# Patient Record
Sex: Male | Born: 1935
Health system: Southern US, Community
[De-identification: ages and names within clinical notes are randomized; demographics above are authoritative.]

## PROBLEM LIST (undated history)

## (undated) DIAGNOSIS — Z5189 Encounter for other specified aftercare: Secondary | ICD-10-CM

## (undated) DIAGNOSIS — K648 Other hemorrhoids: Secondary | ICD-10-CM

## (undated) DIAGNOSIS — D126 Benign neoplasm of colon, unspecified: Secondary | ICD-10-CM

## (undated) DIAGNOSIS — C801 Malignant (primary) neoplasm, unspecified: Secondary | ICD-10-CM

## (undated) DIAGNOSIS — E785 Hyperlipidemia, unspecified: Secondary | ICD-10-CM

## (undated) DIAGNOSIS — K219 Gastro-esophageal reflux disease without esophagitis: Secondary | ICD-10-CM

## (undated) DIAGNOSIS — H269 Unspecified cataract: Secondary | ICD-10-CM

## (undated) DIAGNOSIS — T7840XA Allergy, unspecified, initial encounter: Secondary | ICD-10-CM

## (undated) DIAGNOSIS — I10 Essential (primary) hypertension: Secondary | ICD-10-CM

## (undated) DIAGNOSIS — I6529 Occlusion and stenosis of unspecified carotid artery: Secondary | ICD-10-CM

## (undated) DIAGNOSIS — I493 Ventricular premature depolarization: Secondary | ICD-10-CM

## (undated) DIAGNOSIS — J449 Chronic obstructive pulmonary disease, unspecified: Secondary | ICD-10-CM

## (undated) HISTORY — DX: Unspecified cataract: H26.9

## (undated) HISTORY — PX: APPENDECTOMY: SHX54

## (undated) HISTORY — DX: Gastro-esophageal reflux disease without esophagitis: K21.9

## (undated) HISTORY — DX: Essential (primary) hypertension: I10

## (undated) HISTORY — DX: Other hemorrhoids: K64.8

## (undated) HISTORY — PX: OTHER SURGICAL HISTORY: SHX169

## (undated) HISTORY — PX: POLYPECTOMY: SHX149

## (undated) HISTORY — PX: UPPER GASTROINTESTINAL ENDOSCOPY: SHX188

## (undated) HISTORY — PX: TRANSURETHRAL RESECTION OF PROSTATE: SHX73

## (undated) HISTORY — DX: Occlusion and stenosis of unspecified carotid artery: I65.29

## (undated) HISTORY — PX: COLONOSCOPY: SHX174

## (undated) HISTORY — DX: Chronic obstructive pulmonary disease, unspecified: J44.9

## (undated) HISTORY — DX: Allergy, unspecified, initial encounter: T78.40XA

## (undated) HISTORY — DX: Malignant (primary) neoplasm, unspecified: C80.1

## (undated) HISTORY — DX: Benign neoplasm of colon, unspecified: D12.6

## (undated) HISTORY — PX: CATARACT EXTRACTION: SUR2

## (undated) HISTORY — DX: Ventricular premature depolarization: I49.3

## (undated) HISTORY — DX: Encounter for other specified aftercare: Z51.89

## (undated) HISTORY — DX: Hyperlipidemia, unspecified: E78.5

---

## 1999-11-20 DIAGNOSIS — D126 Benign neoplasm of colon, unspecified: Secondary | ICD-10-CM

## 1999-11-20 HISTORY — DX: Benign neoplasm of colon, unspecified: D12.6

## 1999-12-16 ENCOUNTER — Encounter (INDEPENDENT_AMBULATORY_CARE_PROVIDER_SITE_OTHER): Payer: Self-pay | Admitting: Specialist

## 1999-12-16 ENCOUNTER — Ambulatory Visit (HOSPITAL_COMMUNITY): Admission: RE | Admit: 1999-12-16 | Discharge: 1999-12-16 | Payer: Self-pay | Admitting: Gastroenterology

## 2000-06-22 ENCOUNTER — Ambulatory Visit (HOSPITAL_COMMUNITY): Admission: RE | Admit: 2000-06-22 | Discharge: 2000-06-22 | Payer: Self-pay | Admitting: *Deleted

## 2004-03-29 ENCOUNTER — Ambulatory Visit: Payer: Self-pay | Admitting: Gastroenterology

## 2004-04-12 ENCOUNTER — Ambulatory Visit: Payer: Self-pay | Admitting: Gastroenterology

## 2005-10-05 ENCOUNTER — Ambulatory Visit: Payer: Self-pay | Admitting: Cardiology

## 2005-10-20 ENCOUNTER — Ambulatory Visit: Payer: Self-pay

## 2005-11-04 ENCOUNTER — Encounter (INDEPENDENT_AMBULATORY_CARE_PROVIDER_SITE_OTHER): Payer: Self-pay | Admitting: *Deleted

## 2005-11-04 ENCOUNTER — Inpatient Hospital Stay (HOSPITAL_COMMUNITY): Admission: RE | Admit: 2005-11-04 | Discharge: 2005-11-05 | Payer: Self-pay | Admitting: Vascular Surgery

## 2006-02-01 ENCOUNTER — Ambulatory Visit: Payer: Self-pay | Admitting: Orthopedic Surgery

## 2006-03-20 ENCOUNTER — Ambulatory Visit: Payer: Self-pay | Admitting: Orthopedic Surgery

## 2006-04-24 ENCOUNTER — Ambulatory Visit: Payer: Self-pay | Admitting: Orthopedic Surgery

## 2006-06-20 ENCOUNTER — Ambulatory Visit: Payer: Self-pay | Admitting: Vascular Surgery

## 2006-07-05 ENCOUNTER — Ambulatory Visit: Payer: Self-pay | Admitting: Emergency Medicine

## 2006-08-15 ENCOUNTER — Ambulatory Visit: Payer: Self-pay | Admitting: Emergency Medicine

## 2006-09-05 ENCOUNTER — Ambulatory Visit: Payer: Self-pay | Admitting: Cardiology

## 2006-10-06 ENCOUNTER — Encounter: Payer: Self-pay | Admitting: Cardiology

## 2006-10-06 ENCOUNTER — Ambulatory Visit: Payer: Self-pay

## 2006-10-06 LAB — CONVERTED CEMR LAB
Calcium: 9.7 mg/dL (ref 8.4–10.5)
Chloride: 104 meq/L (ref 96–112)
Creatinine, Ser: 1.1 mg/dL (ref 0.4–1.5)
GFR calc Af Amer: 86 mL/min
Glucose, Bld: 117 mg/dL — ABNORMAL HIGH (ref 70–99)
Potassium: 4.4 meq/L (ref 3.5–5.1)

## 2006-12-26 ENCOUNTER — Ambulatory Visit: Payer: Self-pay | Admitting: Vascular Surgery

## 2007-03-12 ENCOUNTER — Ambulatory Visit: Payer: Self-pay | Admitting: Gastroenterology

## 2007-03-30 ENCOUNTER — Ambulatory Visit: Payer: Self-pay | Admitting: Gastroenterology

## 2007-03-30 ENCOUNTER — Encounter: Payer: Self-pay | Admitting: Gastroenterology

## 2007-08-30 ENCOUNTER — Ambulatory Visit: Payer: Self-pay | Admitting: Cardiology

## 2007-09-24 ENCOUNTER — Ambulatory Visit: Payer: Self-pay | Admitting: Emergency Medicine

## 2007-09-24 DIAGNOSIS — J449 Chronic obstructive pulmonary disease, unspecified: Secondary | ICD-10-CM | POA: Insufficient documentation

## 2007-12-31 ENCOUNTER — Ambulatory Visit: Payer: Self-pay | Admitting: Vascular Surgery

## 2008-04-02 ENCOUNTER — Encounter: Admission: RE | Admit: 2008-04-02 | Discharge: 2008-04-02 | Payer: Self-pay | Admitting: Orthopedic Surgery

## 2008-04-03 ENCOUNTER — Ambulatory Visit (HOSPITAL_BASED_OUTPATIENT_CLINIC_OR_DEPARTMENT_OTHER): Admission: RE | Admit: 2008-04-03 | Discharge: 2008-04-03 | Payer: Self-pay | Admitting: Orthopedic Surgery

## 2008-08-20 DIAGNOSIS — E785 Hyperlipidemia, unspecified: Secondary | ICD-10-CM | POA: Insufficient documentation

## 2008-08-20 DIAGNOSIS — I10 Essential (primary) hypertension: Secondary | ICD-10-CM

## 2008-08-20 DIAGNOSIS — R609 Edema, unspecified: Secondary | ICD-10-CM

## 2008-08-20 DIAGNOSIS — R0602 Shortness of breath: Secondary | ICD-10-CM

## 2008-08-20 DIAGNOSIS — I2510 Atherosclerotic heart disease of native coronary artery without angina pectoris: Secondary | ICD-10-CM | POA: Insufficient documentation

## 2008-08-20 DIAGNOSIS — G5620 Lesion of ulnar nerve, unspecified upper limb: Secondary | ICD-10-CM

## 2008-08-20 DIAGNOSIS — I679 Cerebrovascular disease, unspecified: Secondary | ICD-10-CM

## 2008-08-20 DIAGNOSIS — Z8669 Personal history of other diseases of the nervous system and sense organs: Secondary | ICD-10-CM | POA: Insufficient documentation

## 2008-08-20 DIAGNOSIS — Z8546 Personal history of malignant neoplasm of prostate: Secondary | ICD-10-CM

## 2008-08-21 ENCOUNTER — Ambulatory Visit: Payer: Self-pay | Admitting: Cardiology

## 2008-12-23 ENCOUNTER — Ambulatory Visit: Payer: Self-pay | Admitting: Vascular Surgery

## 2009-09-17 ENCOUNTER — Ambulatory Visit: Payer: Self-pay | Admitting: Cardiology

## 2009-09-30 ENCOUNTER — Telehealth (INDEPENDENT_AMBULATORY_CARE_PROVIDER_SITE_OTHER): Payer: Self-pay | Admitting: *Deleted

## 2009-10-01 ENCOUNTER — Ambulatory Visit: Payer: Self-pay | Admitting: Cardiology

## 2009-10-01 ENCOUNTER — Encounter: Payer: Self-pay | Admitting: Cardiology

## 2009-10-01 ENCOUNTER — Encounter (HOSPITAL_COMMUNITY): Admission: RE | Admit: 2009-10-01 | Discharge: 2009-11-25 | Payer: Self-pay | Admitting: Cardiology

## 2009-10-01 ENCOUNTER — Ambulatory Visit: Payer: Self-pay

## 2010-03-11 ENCOUNTER — Ambulatory Visit: Payer: Self-pay | Admitting: Vascular Surgery

## 2010-04-20 NOTE — Progress Notes (Signed)
Summary: Nuclear Pre-Procedure   Nuclear Med Background Indications for Stress Test: Evaluation for Ischemia   History: COPD, Echo, Heart Catheterization, Myocardial Perfusion Study  History Comments: 04/02 Heart Cath N/O CAD 07/08 MPS (-) ischemis EF 63% '08 ECHO EF 60% mild MR '07 S/P EF (R) CEA COPD  Symptoms: DOE    Nuclear Pre-Procedure Cardiac Risk Factors: Carotid Disease, History of Smoking, Lipids Height (in): 70  Nuclear Med Study Referring MD:  B.Crenshaw

## 2010-04-20 NOTE — Progress Notes (Signed)
Summary: Nuclear Pre-Procedure  Phone Note Outgoing Call   Call placed by: Milana Na, EMT-P,  September 30, 2009 2:59 PM Summary of Call: Left message with information on Myoview Information Sheet (see scanned document for details).      Nuclear Med Background Indications for Stress Test: Evaluation for Ischemia   History: COPD      Nuclear Pre-Procedure Height (in): 70

## 2010-04-20 NOTE — Assessment & Plan Note (Signed)
Summary: f1y/dm    Primary Provider:  Dr. Samuel Jester  CC:  pt takes another med for triglycierdes but dosent no the name of it.  History of Present Illness:  [name] is a pleasant gentleman who has a history of coronary disease on previous catheterization in April 2002.  At that time, he had a 30% left main, 20-30% LAD, 30% circumflex and his LV function was normal.  He also has had a previous carotid endarterectomy in August 2007.  His most recent carotid Dopplers were performed on December 26, 2006, and there was a 20-39% left internal carotid artery stenosis and there was no stenosis in the right.  His most recent Myoview was performed on 10/06/2006.  At that time, he was found to have thinning of the inferior wall, but no ischemia or infarction.  Ejection fraction was 63%.  An echocardiogram in July 2008 showed normal LV function and mild mitral regurgitation as well as tricuspid regurgitation.  He has had a previous abdominal ultrasound showed no aneurysm in July 2008. I last saw him in June of 2010. Since then the patient has dyspnea with more extreme activities but not with routine activities. It is relieved with rest. It is not associated with chest pain. There is no orthopnea, PND or pedal edema. There is no syncope or palpitations. There is no exertional chest pain.   Current Medications (verified): 1)  Spiriva Handihaler 18 Mcg  Caps (Tiotropium Bromide Monohydrate) .... Inhale Contents of 1 Capsule Once A Day 2)  Xyzal 5 Mg  Tabs (Levocetirizine Dihydrochloride) .... Take One Tablet Daily 3)  Ramipril 10 Mg  Caps (Ramipril) .... One Capsule Daliy. 4)  Adult Aspirin Ec Low Strength 81 Mg  Tbec (Aspirin) .... Tale Two Tablets Daily. 5)  Crestor 40 Mg  Tabs (Rosuvastatin Calcium) .... Take One Tablet At Bedtime 6)  Vesicare 10 Mg  Tabs (Solifenacin Succinate) .... Take One Tablet Daily.  Past History:  Past Medical History: CAD (ICD-414.00) CEREBROVASCULAR DISEASE  (ICD-437.9) HYPERLIPIDEMIA (ICD-272.4) HYPERTENSION (ICD-401.9) PROSTATE CANCER (ICD-185) ULNAR NEUROPATHY (ICD-354.2) CARPAL TUNNEL SYNDROME, HX OF (ICD-V12.49) COPD (ICD-496)  Past Surgical History: carotid endarterectomy in August 2007 status post prostatectomy.  Social History: Reviewed history from 08/21/2008 and no changes required.  He is married, has two children and is retired.  He has not  smoked for the past 8 years but did smoke 1-1/2 pack of cigarettes per day for 40 years.  Does not use alcohol.  Review of Systems       no fevers or chills, productive cough, hemoptysis, dysphasia, odynophagia, melena, hematochezia, dysuria, hematuria, rash, seizure activity, orthopnea, PND, pedal edema, claudication. Remaining systems are negative.   Vital Signs:  Patient profile:   75 year old male Height:      70 inches Weight:      177 pounds BMI:     25.49 Pulse rate:   58 / minute Resp:     14 per minute BP sitting:   111 / 67  (left arm)  Vitals Entered By: Kem Parkinson (September 17, 2009 8:31 AM)  Physical Exam  General:  Well-developed well-nourished in no acute distress.  Skin is warm and dry.  HEENT is normal.  Neck is supple. No thyromegaly.  Chest is clear to auscultation with normal expansion.  Cardiovascular exam is regular rate and rhythm.  Abdominal exam nontender or distended. No masses palpated. Extremities show no edema. neuro grossly intact    EKG  Procedure date:  09/17/2009  Findings:      Sinus bradycardia at a rate of 54. No ST changes.  Impression & Recommendations:  Problem # 1:  CAD (ICD-414.00) Continue aspirin, statin and ACE inhibitor. Schedule Myoview for risk stratification. His updated medication list for this problem includes:    Ramipril 10 Mg Caps (Ramipril) ..... One capsule daliy.    Adult Aspirin Ec Low Strength 81 Mg Tbec (Aspirin) .Marland Kitchen... Tale two tablets daily.  His updated medication list for this problem includes:     Ramipril 10 Mg Caps (Ramipril) ..... One capsule daliy.    Adult Aspirin Ec Low Strength 81 Mg Tbec (Aspirin) .Marland Kitchen... Tale two tablets daily.  Problem # 2:  CEREBROVASCULAR DISEASE (ICD-437.9) Continue aspirin and statin. Carotid Dopplers followed by vascular surgery.  Problem # 3:  HYPERLIPIDEMIA (ICD-272.4) Continue present medications. Lipids and liver monitored by primary care. The following medications were removed from the medication list:    Trilipix 135 Mg Cpdr (Choline fenofibrate) .Marland Kitchen... Take one capsule daily. His updated medication list for this problem includes:    Crestor 40 Mg Tabs (Rosuvastatin calcium) .Marland Kitchen... Take one tablet at bedtime  Problem # 4:  HYPERTENSION (ICD-401.9) Blood pressure controlled on present medications. Will continue. Renal function and potassium monitored by primary care. His updated medication list for this problem includes:    Ramipril 10 Mg Caps (Ramipril) ..... One capsule daliy.    Adult Aspirin Ec Low Strength 81 Mg Tbec (Aspirin) .Marland Kitchen... Tale two tablets daily.  Problem # 5:  COPD (ICD-496)  His updated medication list for this problem includes:    Spiriva Handihaler 18 Mcg Caps (Tiotropium bromide monohydrate) ..... Inhale contents of 1 capsule once a day  Other Orders: Nuclear Stress Test (Nuc Stress Test)  Patient Instructions: 1)  Your physician recommends that you schedule a follow-up appointment in: ONE YEAR 2)  Your physician has requested that you have an exercise stress myoview.  For further information please visit [email or web address]  Please follow instruction sheet, as given.

## 2010-04-20 NOTE — Assessment & Plan Note (Signed)
Summary: Cardiology Nuclear Study  Nuclear Med Background Indications for Stress Test: Evaluation for Ischemia   History: COPD, Echo, Heart Catheterization, Myocardial Perfusion Study  History Comments: '02 Cath:n/o CAD; '08 MPS:no ischemia, EF= 63%; '08 Echo:EF=60%, mild MR; '07 (R) CEA  Symptoms: DOE    Nuclear Pre-Procedure Cardiac Risk Factors: Carotid Disease, Family History - CAD, History of Smoking, Hypertension, Lipids Caffeine/Decaff Intake: None NPO After: 9:30 PM Lungs: Clear IV 0.9% NS with Angio Cath: 18g     IV Site: (R) AC IV Started by: Stanton Kidney EMT-P Chest Size (in) 42     Height (in): 70 Weight (lb): 177 BMI: 25.49  Nuclear Med Study 1 or 2 day study:  1 day     Stress Test Type:  Stress Reading MD:  Olga Millers, MD     Referring MD:  Olga Millers, MD Resting Radionuclide:  Technetium 43m Tetrofosmin     Resting Radionuclide Dose:  10.0 mCi  Stress Radionuclide:  Technetium 97m Tetrofosmin     Stress Radionuclide Dose:  33.0 mCi   Stress Protocol Exercise Time (min):  9:45 min     Max HR:  126 bpm     Predicted Max HR:  149 bpm  Max Systolic BP: 181 mm Hg     Percent Max HR:  84.56 %     METS: 10.1 Rate Pressure Product:  16109    Stress Test Technologist:  [employer]a [employer] CMA-N     Nuclear Technologist:  Domenic Polite CNMT  Rest Procedure  Myocardial perfusion imaging was performed at rest 45 minutes following the intravenous administration of Myoview Technetium 75m Tetrofosmin.  Stress Procedure  The patient exercised for 9:45.  The patient stopped due to fatigue and bilateral thigh pain, R=L.  He denied any chest pain.  There were nonspecific ST-T wave changes with frequent PAC's and occasional PVC's.  He had a mild hypertensive response to exercise, 166/100.  Myoview was injected at peak exercise and myocardial perfusion imaging was performed after a brief delay.  QPS Raw Data Images:  Acuisition technically good; normal left ventricular  size. Stress Images:  There is decreased uptake in the inferior wall and apex. Rest Images:  There is decreased uptake in the inferior wall and apex. Subtraction (SDS):  No evidence of ischemia. Transient Ischemic Dilatation:  .95  (Normal <1.22)  Lung/Heart Ratio:  .29  (Normal <0.45)  Quantitative Gated Spect Images QGS EDV:  102 ml QGS ESV:  41 ml QGS EF:  59 % QGS cine images:  Normal wall motion.   Overall Impression  Exercise Capacity: Good exercise capacity. BP Response: Normal blood pressure response. Clinical Symptoms: No chest pain ECG Impression: Insignificant upsloping ST segment depression. Overall Impression: Low risk stress nuclear study with inferior and apical thinning but no ischemia.  Appended Document: Cardiology Nuclear Study pt aware of results

## 2010-07-05 LAB — BASIC METABOLIC PANEL
BUN: 10 mg/dL (ref 6–23)
CO2: 27 mEq/L (ref 19–32)
Chloride: 105 mEq/L (ref 96–112)
Creatinine, Ser: 1.02 mg/dL (ref 0.4–1.5)
GFR calc Af Amer: >60 mL/min (ref >60)
Potassium: 4.8 mEq/L (ref 3.5–5.1)

## 2010-07-05 LAB — POCT HEMOGLOBIN-HEMACUE: Hemoglobin: 14.8 g/dL (ref 13.0–17.0)

## 2010-08-03 NOTE — Procedures (Signed)
CAROTID DUPLEX EXAM   INDICATION:  Follow up right carotid endarterectomy.   HISTORY:  Diabetes:  No.  Cardiac:  No.  Hypertension:  Yes.  Smoking:  Previous.  Previous Surgery:  Right carotid endarterectomy site on 11/04/2005.  CV History:  Currently asymptomatic.  Amaurosis Fugax No, Paresthesias No, Hemiparesis No.                                       RIGHT             LEFT  Brachial systolic pressure:         122               120  Brachial Doppler waveforms:         Normal            Normal  Vertebral direction of flow:        Antegrade         Antegrade  DUPLEX VELOCITIES (cm/sec)  CCA peak systolic                   97                119  ECA peak systolic                   145               92  ICA peak systolic                   50                73  ICA end diastolic                   19                22  PLAQUE MORPHOLOGY:                                    Heterogenous  PLAQUE AMOUNT:                      None              Minimal  PLAQUE LOCATION:                                      ICA/bifurcation   IMPRESSION:  1. Patent right carotid endarterectomy site with no right internal      carotid artery stenosis.  2. No hemodynamically significant stenosis of the left internal      carotid artery with minimal plaque formation, as described above.  3. No significant change noted when compared to the previous      examination on 12/23/2008.   ___________________________________________  Quita Skye. Hart Rochester, M.D.   CH/MEDQ  D:  03/12/2010  T:  03/12/2010  Job:  [id number]

## 2010-08-03 NOTE — Assessment & Plan Note (Signed)
[facility]                             PULMONARY OFFICE NOTE   Anthony, Horn                      MRN:          [id number]  DATE:08/15/2006                            DOB:          [birth year]    SUBJECTIVE:  [name] is a pleasant 75 year old gentleman who  follows up today for his dyspnea.  He has a history of tobacco abuse and  also asbestos exposure.  He has completed pulmonary function testing and  a chest x-ray and is here to review the results of these.  He tells me  that he has not had any significant change in his breathing since our  last visit.  He continues to have wheezing usually with exertion, but  sometimes also at rest.  He is not producing any phlegm and he does not  have any significant cough.  His exertional tolerance is unchanged.   CURRENT MEDICATIONS:  1. Altace 10 mg daily.  2. Ditropan 10 mg daily.  3. Crestor 10 mg daily.  4. TriCor 145 mg daily.  5. Aspirin 81 mg daily.   PHYSICAL EXAMINATION:  GENERAL:  This is a pleasant, elderly gentleman  who is in no acute distress on room air.  HEENT:  He has no stridor.  LUNGS:  Clear to auscultation bilaterally, somewhat distant but without  wheezing or crackles.  HEART:  Regular rate and rhythm without murmur.  He is borderline  bradycardic.  ABDOMEN:  Benign.  EXTREMITIES:  No cyanosis, clubbing, or edema.  VITAL SIGNS:  Weight 185 pounds, temperature 97.7, blood pressure  128/72, heart rate 57, SPO2 97% on room air.   Pulmonary function testing was performed today.  This showed some  evidence of mild airflow limitation without any bronchodilator  responsiveness.  His FEV1 is 2.38 liters or 77% of predicted.  His lung  volumes are normal.  His diffusion capacity is slightly decreased but  corrected to the normal range when adjusted for alveolar volume.  His  chest x-ray from today shows no obvious infiltrates.  He does have some  slight enlargement of his  pulmonary arteries especially on the left.  I  do not see any evidence of pleural plaquing.   IMPRESSION:  1. Dyspnea.  2. Mild chronic obstructive pulmonary disease, no longer smoking.  3. History of asbestos exposure without any current evidence for      restrictive lung disease or asbestosis on chest x-ray.   PLAN:  1. I would like to initiate a trial of Spiriva one inhalation daily to      see if this helps with his exertional dyspnea given his newly      diagnosed COPD.  2. Proair two puffs q.4 hours p.r.n. for shortness of breath.  3. I will defer any further evaluation for asbestos-related lung      disease including high resolution CT scan at this time given his      reassuring PFT's and chest x-ray.  We may need to revisit this at      some point in the future if further evidence evolves that would  be      consistent with asbestosis.  4. I will follow with the patient in 1 month to assess him for      improvement on his bronchodilators.     Leslye Peer, MD  Electronically Signed    RSB/MedQ  DD: 08/15/2006  DT: 08/15/2006  Job #: [id number]   cc:   [facility]

## 2010-08-03 NOTE — Op Note (Signed)
[name], [name]             ACCOUNT NO.:  [id number]   MEDICAL RECORD NO.:  [id number]          PATIENT TYPE:  AMB   LOCATION:  DSC                          FACILITY:  [facility]   PHYSICIAN:  Cindee Salt, M.D.       DATE OF BIRTH:  Apr 17, 1935   DATE OF PROCEDURE:  04/03/2008  DATE OF DISCHARGE:                               OPERATIVE REPORT   PREOPERATIVE DIAGNOSES:  1. Carpal tunnel syndrome, left hand.  2. Ulnar neuropathy, left elbow.   POSTOPERATIVE DIAGNOSES:  1. Carpal tunnel syndrome, left hand.  2. Ulnar neuropathy, left elbow.   OPERATION:  Decompression, left median nerve; decompression, ulnar nerve  left elbow.   SURGEON:  Cindee Salt, MD   ASSISTANT:  Carolyne Fiscal, RN   ANESTHESIA:  Axillary general.   ANESTHESIOLOGIST:  Zenon Mayo, MD   HISTORY:  The patient is a 75 year old male with a history of carpal  tunnel syndrome.  EMG and nerve conductions positive.  Also has ulnar  neuropathy at his left elbow.  He has elected to proceed to have these  surgically released in that he has not responded to conservative  treatment.  In the preoperative area, the patient is seen.  The  extremity marked by both the patient and surgeon, antibiotic given.  He  is well aware of risks and complications including infection;  recurrence; injury to arteries, nerves, and tendons; incomplete relief  of symptoms; dystrophy; possibility of transposition to the ulnar nerve;  possibility of numbness and tingling in the posterior aspect of his  elbow.   PROCEDURE:  The patient was brought to the operating room.  A general  anesthetic was carried out along with an axillary block.  He was prepped  using DuraPrep in supine position with right arm free.  The anesthesia  was given under the direction of Dr. Sampson Goon.  A time-out was taken.  The limb was exsanguinated with an Esmarch bandage.  Tourniquet was  placed high on the arm, was inflated to 250 mmHg.  A longitudinal  incision  was made in the palm, carried down through the subcutaneous  tissue.  Bleeders were electrocauterized.  Palmar fascia was split.  Superficial palmar arch identified.  The flexor tendon to the ring  little finger identified to the ulnar side of the median nerve.  The  carpal retinaculum was incised with sharp dissection.  A right-angle and  Sewall retractors were placed between skin and forearm fascia.  The  fascia was released for approximately a 1.5 cm proximal to the wrist  crease under direct vision.  The canal was explored.  Area of  compression to the nerve was apparent.  No further lesions were  identified.  The wound was irrigated.  Skin was then closed with  interrupted 5-0 Vicryl Rapide sutures.  A separate longitudinal incision  was made on the medial aspect of the elbow, carried down through the  subcutaneous tissue.  The Osborne fascia was identified along the medial  epicondyle.  An incision made in this.  The ulnar nerve was identified.  With blunt and sharp dissection, the proximal  fascia was then released  to the level of the arcade of Struthers.  Retractors were then placed  distally including a Sewall retractor, right angle retractor.  The  superficial fascia of the flexor carpi ulnaris was then released.  With  dissecting scissors, the deep fascia was then released protecting the  ulnar nerve.  This was done for approximately 6 cm in each direction.  The arm placed through full flexion, full extension, no subluxation to  the nerve was apparent.  The wound was copiously irrigated with saline.  The skin closed with interrupted 5-0 Vicryl Rapide sutures.  A sterile  compressive dressing to the wrist was applied along with a splint to the  elbow.  The patient tolerated the procedure well.  On deflation of the  tourniquet, all fingers immediately pinked.  He was taken to the  recovery room for observation in satisfactory condition.  He will be  discharged home, to return to  the [facility] in 1 week,  on Vicodin.           ______________________________  Cindee Salt, M.D.     GK/MEDQ  D:  04/03/2008  T:  04/04/2008  Job:  [id number]   cc:   Dr. Charm Barges

## 2010-08-03 NOTE — Procedures (Signed)
CAROTID DUPLEX EXAM   INDICATION:  Follow up carotid artery disease.   HISTORY:  Diabetes:  No.  Cardiac:  No.  Hypertension:  Yes.  Smoking:  Quit nine years ago.  Previous Surgery:  Right carotid endarterectomy with DPA on 11/04/2005  by Dr. Hart Rochester.  CV History:  Amaurosis Fugax No, Paresthesias No, Hemiparesis No                                       RIGHT             LEFT  Brachial systolic pressure:         120               130  Brachial Doppler waveforms:         Biphasic          Biphasic  Vertebral direction of flow:        Antegrade         Antegrade  DUPLEX VELOCITIES (cm/sec)  CCA peak systolic                   97                128  ECA peak systolic                   129               110  ICA peak systolic                   39                57  ICA end diastolic                   16                25  PLAQUE MORPHOLOGY:                  None              Mixed  PLAQUE AMOUNT:                      None              Mild  PLAQUE LOCATION:                    None              Proximal ICA   IMPRESSION:  1. 20-39% left internal carotid artery stenosis.  2. No right internal carotid artery stenosis, status post      endarterectomy.  3. Study essentially unchanged from June 20, 2006.   ___________________________________________  Quita Skye. Hart Rochester, M.D.   DP/MEDQ  D:  12/26/2006  T:  12/26/2006  Job:  [id number]

## 2010-08-03 NOTE — Procedures (Signed)
CAROTID DUPLEX EXAM   INDICATION:  Follow up right carotid endarterectomy.   HISTORY:  Diabetes:  No.  Cardiac:  No.  Hypertension:  Yes.  Smoking:  Quit.  Previous Surgery:  Right carotid endarterectomy on 11/04/05.  CV History:  Amaurosis Fugax No, Paresthesias No, Hemiparesis No.                                       RIGHT             LEFT  Brachial systolic pressure:         122               118  Brachial Doppler waveforms:         Biphasic          Biphasic  Vertebral direction of flow:        Antegrade         Antegrade  DUPLEX VELOCITIES (cm/sec)  CCA peak systolic                   117               135  ECA peak systolic                   128               111  ICA peak systolic                   66                99  ICA end diastolic                   14                28  PLAQUE MORPHOLOGY:                  None              Heterogenous  PLAQUE AMOUNT:                      None              Mild  PLAQUE LOCATION:                    None              ICA, ECA   IMPRESSION:  1. 20-39% stenosis noted in the left internal carotid artery.  2. Normal carotid duplex noted in the right internal carotid artery,      status post right carotid endarterectomy.  3. Antegrade bilateral vertebral arteries.   ___________________________________________  [name], M.D.   MG/MEDQ  D:  12/23/2008  T:  12/24/2008  Job:  [id number]

## 2010-08-03 NOTE — Assessment & Plan Note (Signed)
[facility]                            CARDIOLOGY OFFICE NOTE   BENN, TARVER                      MRN:          [id number]  DATE:08/30/2007                            DOB:          01-Jun-1935    HISTORY OF PRESENT ILLNESS:  Mr. Calvin is a pleasant gentleman who  has a history of coronary disease on previous catheterization in April  2002.  At that time, he had a 30% left main, 20-30% LAD, 30% circumflex  and his LV function was normal.  He also has had a previous carotid  endarterectomy in August 2007.  His most recent carotid Dopplers were  performed on December 26, 2006, and there was a 20-39% left internal  carotid artery stenosis and there was no stenosis in the right.  His  most recent Myoview was performed on 10/06/2006.  At that time, he was  found to have thinning of the inferior wall, but no ischemia or  infarction.  Ejection fraction was 63%.  An echocardiogram in July 2008  showed normal LV function and mild mitral regurgitation as well as  tricuspid regurgitation.  He has had a previous abdominal ultrasound  showed no aneurysm in July 2008.  Since I last saw him, he denies any  chest pain, shortness of breath or pedal edema.   MEDICATIONS:  1. Altace 10 mg daily.  2. Aspirin 81 mg tablets two p.o. daily.  3. Spiriva.  4. Crestor 20 mg daily.  5. Vesicare.  6. Triplex 135 mg daily.   PHYSICAL EXAMINATION:  VITAL SIGNS:  Blood pressure of 120/80, pulse 70,  weighs 191 pounds.  HEENT:  Normal.  NECK:  Supple.  He does have left carotid bruit.  CHEST:  Clear.  CARDIOVASCULAR:  Regular rhythm.  ABDOMEN:  Exam shows no tenderness.  EXTREMITIES:  Show no edema.   STUDIES:  Electrocardiogram shows a sinus rhythm at a rate of 70.  There  are no significant ST changes.   DIAGNOSES:  1. Coronary artery disease.  He will continue on his aspirin, statin      and ACE inhibitor.  His recent Myoview showed no ischemia.  2.  Hypertension.  His blood pressure is adequately controlled on his      present medications.  I will have his most recent B-met forwarded      to [place] for our records.  3. Hyperlipidemia.  He will continue on his statin, and we will have      his most recent lipids and liver forwarded to [place] from Dr. Silvana Newness      office.  4. History of prostate cancer.  5. History of cerebrovascular disease status post carotid neurectomy.      He will continue on his aspirin, ACE inhibitor, statin.  6. He will continue with diet, exercise.  He does not smoke.   We will see him back in 12 months.     Madolyn Frieze Jens Som, MD, [facility]  Electronically Signed    BSC/MedQ  DD: 08/30/2007  DT: 08/30/2007  Job #: [id number]   cc:   Samuel Jester

## 2010-08-03 NOTE — Assessment & Plan Note (Signed)
[facility]                            CARDIOLOGY OFFICE NOTE   MAYES, SANGIOVANNI                      MRN:          [id number]  DATE:09/05/2006                            DOB:          [birth year]    [name] is a pleasant gentleman that I last saw in July 2007.  At  that time it was noted that he had a history of coronary disease.  We  performed a nuclear study on October 20, 2005.  An ejection fraction was  60%.  There was prior inferior infarct with a very mild peri-infarct  ischemia.  We felt that we would continue with medical therapy.  He also  had carotid Dopplers that showed severe right internal carotid artery  stenosis and he also underwent carotid endarterectomy.  He does have  dyspnea on exertion, which has been a chronic problem and he has also  been diagnosed with COPD.  There is no orthopnea or PND but there is  mild pedal edema.  He has not had chest pain, palpitations or syncope.   His medications include:  1. Altace 10 mg daily.  2. Ditropan 10 mg p.o. daily.  3. Crestor 10 mg p.o. daily.  4. Tricor 145 mg p.o. daily.  5. Aspirin 81 mg p.o. daily.  6. Spiriva.   PHYSICAL EXAMINATION:  Today shows a blood pressure of 146/82 and his  pulse is 76.  He weighs 185 pounds.  His neck is supple.  HEENT:  Normal.  CHEST:  Clear.  CARDIOVASCULAR:  Shows a regular rate.  Abdominal exam shows no pulsatile masses, no bruits.  EXTREMITIES:  Show no edema.   His electrocardiogram shows a sinus rhythm at a rate of 59.  There are  no ST-changes noted.   DIAGNOSES:  1. History of nonobstructive coronary disease with Myoview low risk -      we will continue with medical therapy including aspirin, statin and      Angiotensin-Converter Enzyme inhibitor.  I will repeat his Myoview      to make sure he has not developed any significant new ischemia.  2. History of aneurysmal dilatation of the abdominal aorta - we will      recheck an  abdominal ultrasound.  3. Hypertension - his blood pressure is mildly elevated.  I will add      hydrochlorothiazide 12.5 mg p.o. daily both for his blood pressure      and mild edema.  We will check a BMET in 1 week, __________ tests      and renal function.  4. Hyperlipidemia - he will continue on his statin and we will have      his most recent laboratories forwarded to [place] from Dr. Nelly Laurence office.  5. History of prostate cancer.  6. History of cerebrovascular disease status post carotid      endarterectomy.   He will continue with risk factor modification and we will also check an  echocardiogram for his pedal edema.  I will see him back in 12 months if  the above is normal.  Madolyn Frieze Jens Som, MD, [facility]  Electronically Signed    BSC/MedQ  DD: 09/05/2006  DT: 09/05/2006  Job #: [id number]   cc:   Alfredia Client, MD

## 2010-08-03 NOTE — Procedures (Signed)
CAROTID DUPLEX EXAM   INDICATION:  Follow up right carotid endarterectomy.   HISTORY:  Diabetes:  No.  Cardiac:  No.  Hypertension:  Yes.  Smoking:  Previous.  Previous Surgery:  Right carotid endarterectomy on 11/04/05.  CV History:  Amaurosis Fugax No, Paresthesias No, Hemiparesis No.                                       RIGHT             LEFT  Brachial systolic pressure:         122               128  Brachial Doppler waveforms:         Normal            Normal  Vertebral direction of flow:        Antegrade         Antegrade  DUPLEX VELOCITIES (cm/sec)  CCA peak systolic                   128               107  ECA peak systolic                   130               75  ICA peak systolic                   54                65  ICA end diastolic                   11                21  PLAQUE MORPHOLOGY:                  None              Heterogenous  PLAQUE AMOUNT:                      None              Minimal  PLAQUE LOCATION:                    None              ICA/ECA/CCA   IMPRESSION:  1. Patent right carotid endarterectomy site with no evidence of      stenosis noted.  2. 1-39% stenosis of the left internal carotid artery.  3. No significant change noted when compared to the previous      examination on 12/26/2006.   ___________________________________________  Quita Skye. Hart Rochester, M.D.   CH/MEDQ  D:  12/31/2007  T:  12/31/2007  Job:  [id number]

## 2010-08-06 NOTE — Assessment & Plan Note (Signed)
[facility]                              CARDIOLOGY OFFICE NOTE   NAHEEM, MOSCO                      MRN:          [id number]  DATE:10/05/2005                            DOB:          [birth year]    HISTORY:  Mr. Pruss is a pleasant 75 year old male with past medical  history of nonobstructive coronary disease, hypertension,, hyperlipidemia,  and prostate cancer who we are asked to evaluate for an abnormal exercise  treadmill.  The patient has been seen in this office previously.  A nuclear  study in April 2002 revealed ejection fraction of 63% with mild inferior  ischemia.  There was a positive electrocardiographic response.  He also had  carotid Dopplers in April 2002 that showed bilateral 1 to 39% lesions.  He  subsequent underwent cardiac catheterization by Dr. Glennon Hamilton.  He was  found to have a 30% left main and other nonobstructive disease.  He has been  treated medically.  He does not have dyspnea on exertion, orthopnea, PND,  palpable, presyncope, syncope or exertional chest pain. He had an exercise  treadmill recently.  He was felt to have ST depression.  We were asked to  further evaluate.   MEDICATIONS:  1.  Altace 10 mg p.o. daily.  2.  Ditropan 10 mg p.o. daily.  3.  Crestor 10 mg daily.  4.  Tricor 145 mg p.o. daily.  5.  Aspirin 81 mg p.o. daily.   ALLERGIES:  No known drug allergies.   SOCIAL HISTORY:  He has remote history of tobacco use but has not smoked  from the past eight to nine years.  He rarely consumes alcohol.   FAMILY HISTORY:  Positive for coronary artery disease as his father died of  myocardial infarction at age 41.   PAST MEDICAL HISTORY:  Significant for hypertension and hyperlipidemia.  There is no diabetes mellitus.  He has a history of prostate cancer, status  post prostatectomy.  He has had appendectomy.  There is no other past  medical history noted.   REVIEW OF SYSTEMS:  He denies any  headaches, fevers, chills. There is no  productive cough or hemoptysis.  There is no dysphagia or odynophagia,  melena or hematochezia.  There is no dysuria or hematuria.  He denies  seizure activity.  There is no orthopnea, PND or pedal edema.  There is no  claudication.  The remaining systems were negative.   PHYSICAL EXAMINATION:  VITAL SIGNS: Blood pressure 116/60, pulse 60.  Weighs  178 pounds.  GENERAL:  He is well-developed, well-nourished, in no acute distress.  Skin  is warm and dry.  He does hot appear to be depressed and there is no  peripheral clubbing.  HEENT:  Unremarkable.  Normal eyelids.  NECK:  Supple with normal upstroke bilaterally.  There are bilateral carotid  bruits.  There is no jugular venous distension and I cannot appreciate  thyromegaly.  CHEST:  Clear to auscultation.  Normal expansion.  CARDIOVASCULAR:  Regular rate and rhythm with normal S1 and S2.  There are  no murmurs, rubs, gallops noted.  ABDOMEN:  No tenderness.  Positive bowel sounds.  No hepatosplenomegaly.  No  masses appreciated.  There is no abdominal bruit.  He  has 2+ femoral pulses  bilaterally.  No bruits.  EXTREMITIES:  No edema and I could palpate no cords.  He has 2+ dorsalis  pedis pulses bilaterally.  NEUROLOGIC: Grossly intact.   LABORATORY DATA:  His electrocardiogram from September 14, 2005 showed a normal  sinus rhythm with no ST changes.   DIAGNOSES:  1.  Abnormal exercise treadmill.  2.  Hypertension.  3.  Hyperlipidemia.  4.  History of prostate cancer.  5.  History of nonobstructive coronary artery disease by previous      catheterization.   PLAN:  Mr. Alcorta presents for evaluation and abnormal exercise treadmill.  However, he has no symptoms and a previous catheterization showed  nonobstructive disease.  This certainly could represent a false positive  test.  We will schedule him for an exercise Myoview.  If it shows normal  perfusion and normal LV function, then I do  not think we need to pursue  cardiac evaluation.  We will also schedule him to have carotid Dopplers  given his bruits.  Otherwise he will continue with his present medications.  His blood pressure is well controlled and his lipids and liver are being  followed by Dr. Lowanda Foster.  He does not smoke and he does exercise and follow  diet.  He will see me back on as as-needed basis, pending the results of his  studies.                              Madolyn Frieze Jens Som, MD, [facility]    BSC/MedQ  DD:  10/05/2005  DT:  10/05/2005  Job #:  [id number]   cc:   Caryl Comes. Slotnick, MD

## 2010-08-06 NOTE — Op Note (Signed)
[name], [name]             ACCOUNT NO.:  [id number]   MEDICAL RECORD NO.:  [id number]          PATIENT TYPE:  INP   LOCATION:  2550                         FACILITY:  [facility]   PHYSICIAN:  Quita Skye. Hart Rochester, M.D.  DATE OF BIRTH:  30-May-1935   DATE OF PROCEDURE:  11/04/2005  DATE OF DISCHARGE:                                 OPERATIVE REPORT   PREOPERATIVE DIAGNOSIS:  Severe right internal carotid stenosis -  asymptomatic.   POSTOPERATIVE DIAGNOSIS:  Severe right internal carotid stenosis -  asymptomatic.   OPERATION:  Right carotid endarterectomy with Dacron patch angioplasty.   SURGEON:  Dr. Hart Rochester.   FIRST ASSISTANT:  Constance Holster, [place].   ANESTHESIA:  General endotracheal.   BRIEF HISTORY:  This patient was undergoing cardiac evaluation by Dr.  Jens Som and was found to have a right carotid bruit.  Duplex scan revealed  an 85-90% right internal carotid stenosis which is asymptomatic.  He was  scheduled for right carotid endarterectomy.   DESCRIPTION OF PROCEDURE:  The patient was taken to the operating room,  placed in the supine position at which time satisfactory general  endotracheal anesthesia was administered.  The right neck was prepped with  Betadine scrub and solution and draped in routine sterile manner.  An  incision was made along the anterior border of the sternocleidomastoid  muscle, carried down through subcutaneous tissue and platysma using the  Bovie.  The common facial vein and external jugular vein was ligated with 3-  0 silk ties and divided exposing the common internal and external carotid  arteries.  Care was taken not to injure the vagus or hypoglossal nerves both  of which were exposed.  There was a calcified atherosclerotic plaque at the  carotid bifurcation extending up the internal carotid artery for a long  distance approximately 5 cm past the crossing of the hypoglossal nerve.  This required a long dissection but care was taken not to  injure any of the  nerves including the glossopharyngeal.  A #10 shunt was prepared and the  patient was heparinized.  The carotid vessels were occluded with vascular  clamps, a longitudinal opening made in the common carotid with a 15 blade,  extended up the internal carotid with the Potts scissors to a point distal  to the disease.  The most severe area of stenosis was about 3 cm distal to  the bifurcation which was about 90% in severity.  A #10 shunt was inserted  without difficulty reestablishing flow in about 2 minutes.  A standard  endarterectomy was then performed using the elevator and the Potts scissors  with an eversion endarterectomy of the external carotid.  The plaque  feathered off the distal internal carotid nicely not requiring any tacking  sutures.  The lumen was thoroughly irrigated with heparin saline and all  loose debris carefully removed and the arteriotomy was closed the patch  using continuous 6-0 Prolene.  Prior to completion of the closure, the shunt  was removed after approximately 30 minutes of shunt time. Following  antegrade and retrograde flushing, the closure was completed with  reestablishment of flow initially up the external and up the internal  branch.  The carotid was  occluded for less than 2 minutes for removal of the shunt.  Protamine was  then given to reverse the heparin. Following that hemostasis, the wound was  irrigated with saline, closed in layers with Vicryl in a subcuticular  fashion.  A sterile dressing applied.  The patient taken to the recovery  room in satisfactory condition.           ______________________________  Quita Skye Hart Rochester, M.D.     JDL/MEDQ  D:  11/04/2005  T:  11/04/2005  Job:  [id number]   cc:   Madolyn Frieze. Jens Som, MD,FACC

## 2010-08-06 NOTE — Discharge Summary (Signed)
[name], [name]             ACCOUNT NO.:  [id number]   MEDICAL RECORD NO.:  [id number]          PATIENT TYPE:  INP   LOCATION:  3308                         FACILITY:  [facility]   PHYSICIAN:  Rowe Clack, P.A.-C. DATE OF BIRTH:  [birth year]   DATE OF ADMISSION:  11/04/2005  DATE OF DISCHARGE:  11/05/2005                                 DISCHARGE SUMMARY   HISTORY OF PRESENT ILLNESS:  Patient is a 75 year old male who was recently  being evaluated by Dr. Jens Som for an abnormal exercise stress test.  He  had previously had some abnormal studies in April of 2002 and underwent  cardiac catheterization by Dr. Corinda Gubler, which revealed very minor non  obstructive coronary artery disease with excellent ventricular function.  He  does not have any chest pain, dyspnea on exertion, paroxysmal nocturnal  dyspnea, orthopnea, or other cardiac symptoms at the time of this  evaluation.  He was further evaluated by Dr. Jens Som following this initial  abnormal result.  He was also found to have a carotid bruit on the right and  a duplex ultrasound revealed an 80-90% right internal carotid artery  stenosis.  He had no symptoms related to this.  He was referred to Dr.  Hart Rochester for carotid endarterectomy due to the severity of this asymptomatic  lesion.  He was admitted this hospitalization for a right carotid  endarterectomy.   PAST MEDICAL HISTORY:  1. Hypertension.  2. Hyperlipidemia.  3. Prostate cancer status post prostatectomy.  4. Negative for diabetes mellitus, significant coronary artery disease,      COPD, or a stroke.   PAST SURGICAL HISTORY:  Includes TURP and appendectomy.   Family history, social history, review of systems, physical exam, please see  the history and physical done at the time of admission.   ALLERGIES:  None.   MEDICATIONS PRIOR TO ADMISSION:  1. Ditropan 10 mg daily.  2. Altace 10 mg daily.  3. Tricor 145 mg daily.  4. Crestor 10 mg daily.  5. Aspirin 81 mg  daily.   HOSPITAL COURSE:  Patient was admitted electively and on November 04, 2005  taken to the operating room at which time he underwent a right carotid  endarterectomy.  He tolerated this procedure well, remaining neurologically  intact and was taken to the post anesthesia care unit in stable condition.  Postoperative hospital course:  Patient did well.  He remained  neurologically intact.  Incision evidenced good healing without evidence of  bleeding or hematoma.  He remained hemodynamically stable.  He followed all  routine postoperative progression and activities, diet, etc.  His overall  status was deemed to be acceptable for discharge on November 05, 2005.   DISCHARGE INSTRUCTIONS:  The patient received written instructions in regard  to medications, activity, diet, wound care, and followup.   FOLLOWUP:  Included Dr. Hart Rochester 2 weeks post discharge.   DISCHARGE MEDICATIONS:  Medications on discharge were as preoperatively.  Additionally for pain:  1. Tylox 1 or 2 every 4-6 hours p.r.n. as needed.   FINAL DIAGNOSIS:  Severe asymptomatic right carotid artery disease status  post  endarterectomy.   OTHER DIAGNOSES:  As previously listed per the history.      Rowe Clack, P.A.-C.     Sherryll Burger  D:  01/17/2006  T:  01/17/2006  Job:  [id number]   cc:   Quita Skye. Hart Rochester, M.D.  Madolyn Frieze Jens Som, MD, [facility]  Caryl Comes. Slotnick, M.D.

## 2010-08-06 NOTE — H&P (Signed)
[name], [name]             ACCOUNT NO.:  [id number]   MEDICAL RECORD NO.:  [id number]           PATIENT TYPE:   LOCATION:                                 FACILITY:   PHYSICIAN:  Quita Skye. Hart Rochester, M.D.       DATE OF BIRTH:   DATE OF ADMISSION:  11/04/2005  DATE OF DISCHARGE:                                HISTORY & PHYSICAL   CHIEF COMPLAINT:  Severe right internal carotid stenosis - asymptomatic.   HISTORY OF PRESENT ILLNESS:  A 75 year old male patient was recently being  evaluated by Dr. Jens Som for an abnormal exercise stress test.  He had  previously had some abnormal studies in April 2002 and underwent cardiac  catheterization by Dr. Read Drivers which revealed very minor nonobstructive  coronary artery disease with excellent ventricular function.  He does not  have any chest pain, dyspnea on exertion, PND, orthopnea or other cardiac  symptoms at this time.  He was further evaluated by Dr. Jens Som following  this initial abnormal result.  He also was found to have carotid bruit on  the right and a duplex ultrasound revealed a 80-90% right internal carotid  stenosis.  He denies any hemispheric or nonhemispheric TIAs, amaurosis  fugax, diplopia, blurred vision or syncope.   PAST MEDICAL HISTORY:  1.  Hypertension.  2.  Hyperlipidemia.  3.  Prostate cancer post prostatectomy.  4.  Negative for diabetes mellitus, significant coronary artery disease,  COPD or stroke.   PAST SURGERIES:  TURP and appendectomy.   FAMILY HISTORY:  Positive for colon cancer in his mother, coronary artery  disease in his father who died at age 71 of a myocardial infarction,  negative for stroke and diabetes.   SOCIAL HISTORY:  He is married, has two children and is retired.  He has not  smoked for the past 8 years but did smoke 1-1/2 pack of cigarettes per day  for 40 years.  Does not use alcohol.   REVIEW OF SYSTEMS:  Negative for anorexia, weight loss, fatigue, chest pain,  dyspnea on  exertion, PND, orthopnea, productive cough, wheezing,  hematemesis, melena, dysphagia or other generalized symptoms.   ALLERGIES:  None.   MEDICATIONS:  1.  Ditropan 10 mg one a day.  2.  Altace (Ramatair) 10 mg once a day.  3.  Tricor 145 mg once a day.  4.  Crestor were 10 mg once a day.  5.  Aspirin 81 mg once a day.   PHYSICAL EXAM:  VITAL SIGNS:  Blood pressure is 124/60 in the left arm,  122/70 in the right arm, heart rate 60, respirations are 18.  GENERAL:  A healthy-appearing male in no apparent distress.  Alert and  oriented x3.  NECK:  Supple with 3+ carotid pulses palpable.  There is a soft bruit on the  right.  No bruit on the left.  No palpable adenopathy in the neck.  Thyroid  is not palpable.  NEUROLOGIC:  Normal.  EXTREMITIES:  Upper extremity pulses 3+ bilaterally.  CHEST: Clear to auscultation.  CARDIOVASCULAR:  Reveals a regular rhythm. No murmurs  are audible.  ABDOMEN:  Soft, nontender with no palpable masses.  PULSES:  He has 3+ femoral, popliteal and posterior tibial pulses palpable  bilaterally.   IMPRESSION:  1.  Severe right internal carotid stenosis - asymptomatic.  2.  Hypertension.  3.  Hyperlipidemia.  4.  History of prostate cancer status post prostatectomy.   PLAN:  Admit the patient on Friday, August 17, for an elective right carotid  endarterectomy.  The risks and benefits have been fully discussed with the  patient and he would like to proceed.           ______________________________  Quita Skye Hart Rochester, M.D.     JDL/MEDQ  D:  10/25/2005  T:  10/25/2005  Job:  [id number]   cc:   Olga Millers, M.D. [facility]  [employer]. Slotnick, M.D.

## 2010-08-06 NOTE — Procedures (Signed)
[facility]  Patient:    [name], [name]                      MRN: [id number] Proc. Date: 12/16/99 Attending:  Judie Petit T. Pleas Koch., M.D. [facility] CC:         Dr. Marella Bile   Procedure Report  PROCEDURE:  Colonoscopy with biopsies and hot biopsy polypectomy x2.  ENDOSCOPIST:  Venita Lick. Pleas Koch., M.D.  REFERRING PHYSICIAN:  Dr. Marella Bile.  INDICATIONS:  This is a 75 year old white male with intermittent hematochezia, diarrhea and mother with a history of colon polyps.  PHYSICAL EXAMINATION:  Chest:  Clear to auscultation and percussion.  Cardiac: Regular rate and rhythm without murmurs.  Neurologic:  Alert and oriented x3.  ANESTHESIA:  Fentanyl 100 mcg IV, Versed 8 mg IV.  MONITORING:  Automated blood pressure monitor, pulse oximeter and cardiac monitor.  Low-flow oxygen was given by nasal cannula throughout the procedure. The procedure was well tolerated with no immediate complications.  DESCRIPTION OF PROCEDURE:  After the nature of the procedure was discussed with the patient including discussion of his risks, benefits and alternatives, he consented to proceed.  He was then comfortably sedated in the left lateral decubitus position.  Digital rectal examination revealed a slightly tender anal canal and no lesions.  The Olympus pediatric video colonoscope was pediatric video colonoscope was inserted in the rectal vault.  The area was insufflated and the colonoscope was advanced to the cecum.  The colonoscopy was difficult.  The colon was very tortuous and there was frequent looping of the colonoscope.  The patient was repositioned several times and external abdominal pressure was applied and the cecum was eventually reached.  The bowel preparation was good with several areas of turbid liquid stool.  Most of these areas were easily washed and suctioned.  The cecum was identified by the appendiceal orifice and ileocecal valve orifice.  On  slow withdrawal of the colonoscope, the visualized portions of the cecum, ascending colon, hepatic flexure, transverse colon, splenic flexure and descending colons were unremarkable.  Random biopsies were obtained in the transverse colon and proximal sigmoid colon given his history of diarrhea.  In the mid sigmoid colon, at approximately 30 cm, there was a 5 to 6 mm polyp which was sessile and removed by hot biopsy technique.  On retroflexed view of the distal rectum, there was a 5 to 6 mm polyp about 4 cm from the anal verge.  This was removed by hot biopsy technique.  Small internal hemorrhoids were noted on retroflexed view of the rectum.  The colon was decompressed.  The colonoscope was withdrawn from the patient.  IMPRESSION: 1. Colonoscopy to cecum. 2. Tortuous colon and difficult procedure. 3. Two colon polyps - removed. 4. Random biopsies obtained. 5. Small internal hemorrhoids.  RECOMMENDATIONS: 1. Standard post polypectomy instructions with no aspirin or NSAID products    for two weeks. 2. Await polyp pathology.  If either polyp is adenomatous, I would    recommend a followup colonoscopy in five years. 3. Long term high fiber diet. 4. Over-the-counter Anusol suppositories, q.h.s., p.r.n., for management of    hemorrhoidal symptoms. 5. Ongoing followup with Dr. Marella Bile. DD:  12/16/99 TD:  12/16/99 Job: [id number]

## 2010-08-06 NOTE — Assessment & Plan Note (Signed)
[facility]                             PULMONARY OFFICE NOTE   [name], [name]                      MRN:          [id number]  DATE:07/05/2006                            DOB:          Dec 25, 1935    PULMONARY CONSULT:   REASON FOR CONSULTATION:  This is a self-referral by [name] for  progressive exertional shortness of breath.   HISTORY:  [name] is a 75 year old man with a history of coronary  artery disease that was first identified in 2002.  He also has  hypertension, hypercholesterolemia, and a significant tobacco history.  He quit smoking in 1999.  He tells me that beginning approximately one  year ago, he began to notice slowly progressive exertional dyspnea.  This has worsened slowly, particularly over the last 3-4 months.  He is  able to walk approximately 250 feet down his driveway, and then he has  to rest for several minutes to catch his breath.  He hears wheezing when  he exerts himself heavily.  He denies any cough, although he does have  frequent sneezing.  He has not had any exertional or resting chest pain.  The shortness of breath is not made worse by position or bending over.  He denies any lower extremity edema.  He has not identified any  significant triggers for his wheezing or shortness of breath beyond  exertion.   PAST MEDICAL HISTORY:  1. Coronary artery disease, status post cardiac catheterization in      April, 2002 that showed mild left main disease.  He has had      subsequent stress tests that have been, for the most part,      reassuring.  2. Hypertension.  3. Hypercholesterolemia.  4. Prostate cancer, status post prostatectomy in 1996.  5. Carotid endarterectomy on the right in August, 2007.  6. Appendectomy in 1963.   ALLERGIES:  No known drug allergies.   CURRENT MEDICATIONS:  1. Altace 10 mg daily.  2. Ditropan 10 mg daily.  3. Crestor 10 mg daily.  4. Tricor 145 mg daily.  5. Aspirin 81  mg daily.   SOCIAL HISTORY:  The patient is married.  He lives with his wife  locally.  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  He  did have chemical and cleaner exposure during that job.  He was also in  the [employer] and was a member of the [employer] and in [employer] for  over 20 years.  He had a significant asbestos exposure while he was  active in the [facility] for approximately 3-4 years.  He denies any  known tuberculosis exposure.   FAMILY HISTORY:  Significant for coronary artery disease and colon  cancer.   REVIEW OF SYSTEMS:  As per the HPI.   PHYSICAL EXAMINATION:  GENERAL:  This is a very pleasant, well-appearing  man in no distress on room air.  VITAL SIGNS:  His weight is 185 pounds.  Temperature 97, blood pressure  146/88, heart rate 66, SpO2 96% on room air.  NECK:  Supple without lymphadenopathy.  He has an old right carotid  endarterectomy scar.  I do not hear a bruit.  LUNGS:  Significant for some mild right upper lobe inspiratory wheezing,  otherwise he has no abnormal sounds and good air movement.  HEART:  Regular rate and rhythm without murmur.  ABDOMEN:  Soft and nontender with positive bowel sounds.  EXTREMITIES:  No clubbing, cyanosis or edema.  NEUROLOGIC:  He has a nonfocal exam.   Stress Myoview was performed on October 20, 2005.  This showed good  exercise capacity with a normal blood pressure response to exercise.  He  did have diffuse ST changes with exercise, consistent with possible  ischemia.  He also had imaging consistent with an infarct and very mild  peri-infarct ischemia.   IMPRESSION:  Exertional dyspnea with wheezing:  This is most likely due  to chronic obstructive pulmonary disease, although I believe we must  also consider possible coronary artery disease and anginal equivalent.  I would also consider a possible impact of his prior asbestos exposure.   PLAN:  1. Full pulmonary function testing.  2. Chest  x-ray.  3. If we confirm air flow limitation on his pulmonary function      testing, then I will treat him with bronchodilators as indicated.  4. If his pulmonary function testing shows restriction or if his x-ray      is consistent with pleural disease, then I will obtain a high-      resolution CT scan of the chest to evaluate for possible changes      related to his asbestosis exposure.  5. I have encouraged [name] to pursue followup with cardiology      if our evaluation is unrevealing.  6. I will follow up with [name] at my next available appointment      to review his PFT results and his chest x-ray.     Leslye Peer, MD  Electronically Signed   RSB/MedQ  DD: 07/05/2006  DT: 07/05/2006  Job #: [id number]   cc:   [facility]

## 2010-08-06 NOTE — Cardiovascular Report (Signed)
[name]  Patient:    [name], [name]                      MRN: [id number] Adm. Date:  [id number] Attending:  Alric Quan CC:         Monica Becton, M.D., [place], [place]  [facility]   Cardiac Catheterization  PROCEDURE:  Selective coronary angiography, left ventriculography and abdominal aortography -- Judkins technique.  INDICATION:  Patient is a 75 year old gentleman who discontinued cigarettes three years ago, who presented with dyspnea on exertion and abnormal stress Cardiolite and presents now for a diagnostic coronary angiography.  RESULTS: Pressures: LV systolic 120, diastolic 7; aortic systolic 120, diastolic 70.  Angiography: 1. Left main coronary reveals 30% ostial lesion and questionable tapering in    distally. 2. The left anterior descending reveals some minor irregularity with    20-30% lesion proximally. 3. The circumflex revealed a 30% lesion after OM-1. 4. The right coronary artery was dominant and normal. 5. The left ventricle was normal. 6. The abdominal aortogram reveals a 30% ostial lesion of the left renal    artery with some irregularity of the abdominal aorta with small area of    aneurysmal dilatation distally.  SUMMARY:  Patient with mild coronary artery disease, as noted above, with no evidence of high-grade lesion.  The left ventricle is normal.  The abdominal aortogram reveals a 30% left renal artery stenosis and some irregularity of the abdominal aorta with slight focal aneurysmal dilatation distally.  Patient is to continue on risk factor modification.  He is to follow up with Dr. Monica Becton in one to two weeks and I will be happy to see him for a stress Cardiolite in a year. DD:  06/22/00 TD:  06/22/00 Job: [id number]

## 2012-01-10 ENCOUNTER — Encounter: Payer: Self-pay | Admitting: Vascular Surgery

## 2012-01-20 ENCOUNTER — Encounter: Payer: Self-pay | Admitting: Vascular Surgery

## 2012-01-27 ENCOUNTER — Other Ambulatory Visit: Payer: Self-pay | Admitting: *Deleted

## 2012-01-27 DIAGNOSIS — I6529 Occlusion and stenosis of unspecified carotid artery: Secondary | ICD-10-CM

## 2012-01-27 DIAGNOSIS — Z48812 Encounter for surgical aftercare following surgery on the circulatory system: Secondary | ICD-10-CM

## 2012-01-30 ENCOUNTER — Encounter: Payer: Self-pay | Admitting: Neurosurgery

## 2012-01-31 ENCOUNTER — Ambulatory Visit (INDEPENDENT_AMBULATORY_CARE_PROVIDER_SITE_OTHER): Payer: Medicare Other | Admitting: Neurosurgery

## 2012-01-31 ENCOUNTER — Encounter: Payer: Self-pay | Admitting: Neurosurgery

## 2012-01-31 ENCOUNTER — Other Ambulatory Visit (INDEPENDENT_AMBULATORY_CARE_PROVIDER_SITE_OTHER): Payer: Medicare Other | Admitting: *Deleted

## 2012-01-31 VITALS — BP 125/75 | HR 61 | Resp 16 | Ht 70.0 in | Wt 168.0 lb

## 2012-01-31 DIAGNOSIS — I6529 Occlusion and stenosis of unspecified carotid artery: Secondary | ICD-10-CM | POA: Insufficient documentation

## 2012-01-31 DIAGNOSIS — Z48812 Encounter for surgical aftercare following surgery on the circulatory system: Secondary | ICD-10-CM

## 2012-01-31 NOTE — Progress Notes (Signed)
[facility] Carot[facility] Office Note  CC: Carotid surveillance Referring Physician: Hart Rochester  History of Present Illness: 76 year old male patient of Dr. Hart Rochester status post right CEA in 2007. The patient denies any signs or symptoms of CVA, TIA, amaurosis fugax or any neural deficit. The patient denies any new medical diagnoses or recent surgery.  Past Medical History  Diagnosis Date  . Carotid artery occlusion   . Hyperlipidemia   . Hypertension   . Cancer     Prostate    ROS: [x]  Positive   [ ]  Denies    General: [ ]  Weight loss, [ ]  Fever, [ ]  chills Neurologic: [ ]  Dizziness, [ ]  Blackouts, [ ]  Seizure [ ]  Stroke, [ ]  "Mini stroke", [ ]  Slurred speech, [ ]  Temporary blindness; [ ]  weakness in arms or legs, [ ]  Hoarseness Cardiac: [ ]  Chest pain/pressure, [ ]  Shortness of breath at rest [ ]  Shortness of breath with exertion, [ ]  Atrial fibrillation or irregular heartbeat Vascular: [ ]  Pain in legs with walking, [ ]  Pain in legs at rest, [ ]  Pain in legs at night,  [ ]  Non-healing ulcer, [ ]  Blood clot in vein/DVT,   Pulmonary: [ ]  Home oxygen, [ ]  Productive cough, [ ]  Coughing up blood, [ ]  Asthma,  [ ]  Wheezing Musculoskeletal:  [ ]  Arthritis, [ ]  Low back pain, [ ]  Joint pain Hematologic: [ ]  Easy Bruising, [ ]  Anemia; [ ]  Hepatitis Gastrointestinal: [ ]  Blood in stool, [ ]  Gastroesophageal Reflux/heartburn, [ ]  Trouble swallowing Urinary: [ ]  chronic Kidney disease, [ ]  on HD - [ ]  MWF or [ ]  TTHS, [ ]  Burning with urination, [ ]  Difficulty urinating Skin: [ ]  Rashes, [ ]  Wounds Psychological: [ ]  Anxiety, [ ]  Depression   Social History History  Substance Use Topics  . Smoking status: Former Smoker -- 40 years    Types: Cigarettes    Quit date: 01/19/1997  . Smokeless tobacco: Never Used  . Alcohol Use: No    Family History Family History  Problem Relation Age of Onset  . Cancer Mother     colon  . Heart disease Father   . Heart attack  Father     No Known Allergies  Current Outpatient Prescriptions  Medication Sig Dispense Refill  . aspirin 81 MG tablet Take 81 mg by mouth 2 (two) times daily.       Marland Kitchen ezetimibe (ZETIA) 10 MG tablet Take 10 mg by mouth daily.      . fenofibrate (TRICOR) 145 MG tablet Take 145 mg by mouth daily.      . ramipril (ALTACE) 10 MG tablet Take 10 mg by mouth daily.      . solifenacin (VESICARE) 10 MG tablet Take 10 mg by mouth daily.      Marland Kitchen oxybutynin (DITROPAN-XL) 10 MG 24 hr tablet Take 10 mg by mouth daily.      . rosuvastatin (CRESTOR) 10 MG tablet Take 10 mg by mouth daily.        Physical Examination  Filed Vitals:   01/31/12 1347  BP: 125/75  Pulse: 61  Resp:     Body mass index is 24.11 kg/(m^2).  General:  WDWN in NAD Gait: Normal HEENT: WNL Eyes: Pupils equal Pulmonary: normal non-labored breathing , without Rales, rhonchi,  wheezing Cardiac: RRR, without  Murmurs, rubs or gallops; Abdomen: soft, NT, no masses Skin: no rashes, ulcers noted  Vascular Exam Pulses: 3+  radial pulses bilaterally Carotid bruits: Carotid pulses to auscultation no bruits are heard Extremities without ischemic changes, no Gangrene , no cellulitis; no open wounds;  Musculoskeletal: no muscle wasting or atrophy   Neurologic: A&O X 3; Appropriate Affect ; SENSATION: normal; MOTOR FUNCTION:  moving all extremities equally. Speech is fluent/normal  Non-Invasive Vascular Imaging CAROTID DUPLEX 01/31/2012  Right ICA 0 - 19% stenosis Left ICA 20 - 39 % stenosis   ASSESSMENT/PLAN: Asymptomatic patient with very mild to mild bilateral carotid stenosis. The patient will followup in one year with repeat carotid duplex. The patient's questions were encouraged and answered, he is in agreement with this plan.  Lauree Chandler ANP   Clinic MD: Hart Rochester

## 2012-02-20 ENCOUNTER — Encounter: Payer: Self-pay | Admitting: Gastroenterology

## 2012-02-21 ENCOUNTER — Encounter: Payer: Self-pay | Admitting: Gastroenterology

## 2012-02-22 ENCOUNTER — Encounter: Payer: Self-pay | Admitting: Gastroenterology

## 2012-03-30 ENCOUNTER — Ambulatory Visit (AMBULATORY_SURGERY_CENTER): Payer: Medicare Other | Admitting: *Deleted

## 2012-03-30 VITALS — Ht 70.5 in | Wt 166.6 lb

## 2012-03-30 DIAGNOSIS — Z1211 Encounter for screening for malignant neoplasm of colon: Secondary | ICD-10-CM

## 2012-03-30 MED ORDER — PEG-KCL-NACL-NASULF-NA ASC-C 100 G PO SOLR: ORAL | Status: DC

## 2012-03-30 NOTE — Progress Notes (Signed)
No allergy to egg or soy products  

## 2012-04-03 ENCOUNTER — Other Ambulatory Visit: Payer: Self-pay | Admitting: *Deleted

## 2012-04-03 DIAGNOSIS — Z48812 Encounter for surgical aftercare following surgery on the circulatory system: Secondary | ICD-10-CM

## 2012-04-03 DIAGNOSIS — I6529 Occlusion and stenosis of unspecified carotid artery: Secondary | ICD-10-CM

## 2012-04-12 ENCOUNTER — Encounter: Payer: Self-pay | Admitting: Gastroenterology

## 2012-04-12 ENCOUNTER — Ambulatory Visit (AMBULATORY_SURGERY_CENTER): Payer: Medicare Other | Admitting: Gastroenterology

## 2012-04-12 VITALS — BP 122/61 | HR 53 | Temp 96.2°F | Resp 25 | Ht 70.0 in | Wt 166.0 lb

## 2012-04-12 DIAGNOSIS — Z1211 Encounter for screening for malignant neoplasm of colon: Secondary | ICD-10-CM

## 2012-04-12 DIAGNOSIS — Z86010 Personal history of colonic polyps: Secondary | ICD-10-CM

## 2012-04-12 MED ORDER — SODIUM CHLORIDE 0.9 % IV SOLN: 500.0000 mL | INTRAVENOUS | Status: DC

## 2012-04-12 NOTE — Progress Notes (Signed)
Patient did not experience any of the following events: a burn prior to discharge; a fall within the facility; wrong site/side/patient/procedure/implant event; or a hospital transfer or hospital admission upon discharge from the facility. (G8907) Patient did not have preoperative order for IV antibiotic SSI prophylaxis. (G8918)  

## 2012-04-12 NOTE — Patient Instructions (Addendum)

## 2012-04-12 NOTE — Progress Notes (Signed)
Lidocaine-40mg IV prior to Propofol InductionPropofol given over incremental dosages 

## 2012-04-12 NOTE — Op Note (Signed)
[facility] [address] [place], [address]   COLONOSCOPY PROCEDURE REPORT  PATIENT: Anthony, Horn  MR#: [id number] BIRTHDATE: [birth year] , 76  yrs. old GENDER: Male ENDOSCOPIST: Meryl Dare, MD, [facility] PROCEDURE DATE:  04/12/2012 PROCEDURE:   Colonoscopy, screening ASA CLASS:   Class II INDICATIONS:Patient's personal history of adenomatous colon polyps.  MEDICATIONS: MAC sedation, administered by CRNA and propofol (Diprivan) 150mg  IV DESCRIPTION OF PROCEDURE:   After the risks benefits and alternatives of the procedure were thoroughly explained, informed consent was obtained.  A digital rectal exam revealed no abnormalities of the rectum.   The LB [id number]  endoscope was introduced through the anus and advanced to the cecum, which was identified by both the appendix and ileocecal valve. No adverse events experienced.   Limited by a tortuous colon.   The quality of the prep was good, using MoviPrep  The instrument was then slowly withdrawn as the colon was fully examined.  COLON FINDINGS: A normal appearing cecum, ileocecal valve, and appendiceal orifice were identified.  The ascending, hepatic flexure, transverse, splenic flexure, descending, sigmoid colon and rectum appeared unremarkable.  No polyps or cancers were seen. Retroflexed views revealed no abnormalities. The time to cecum=4 minutes 33 seconds.  Withdrawal time=8 minutes 40 seconds.  The scope was withdrawn and the procedure completed.  COMPLICATIONS: There were no complications.  ENDOSCOPIC IMPRESSION: 1.  Normal colon  RECOMMENDATIONS: 1.  Repeat Colonoscopy in 5 years.   eSigned:  Meryl Dare, MD, [facility] 04/12/2012 11:55 AM   cc: Samuel Jester, DO

## 2012-04-13 ENCOUNTER — Telehealth: Payer: Self-pay | Admitting: *Deleted

## 2012-04-13 NOTE — Telephone Encounter (Signed)
  Follow up Call-  Call back number 04/12/2012  Post procedure Call Back phone  # [phone]  Permission to leave phone message Yes     Patient questions:  Do you have a fever, pain , or abdominal swelling? no Pain Score  0 *  Have you tolerated food without any problems? yes  Have you been able to return to your normal activities? yes  Do you have any questions about your discharge instructions: Diet   no Medications  no Follow up visit  no  Do you have questions or concerns about your Care? no  Actions: * If pain score is 4 or above: No action needed, pain <4.

## 2012-10-23 ENCOUNTER — Encounter: Payer: Self-pay | Admitting: Gastroenterology

## 2012-11-23 ENCOUNTER — Ambulatory Visit (INDEPENDENT_AMBULATORY_CARE_PROVIDER_SITE_OTHER): Payer: Medicare Other | Admitting: Gastroenterology

## 2012-11-23 ENCOUNTER — Encounter: Payer: Self-pay | Admitting: Gastroenterology

## 2012-11-23 VITALS — BP 114/66 | HR 68 | Ht 70.0 in | Wt 162.8 lb

## 2012-11-23 DIAGNOSIS — Z86010 Personal history of colonic polyps: Secondary | ICD-10-CM

## 2012-11-23 DIAGNOSIS — R1319 Other dysphagia: Secondary | ICD-10-CM

## 2012-11-23 NOTE — Patient Instructions (Addendum)
Will request records from Dr. Cathie Beams office and will call you when Dr. Russella Dar has reviewed the records.   Thank you for choosing me and [facility].  Venita Lick. Pleas Koch., MD., [facility] Graham

## 2012-11-23 NOTE — Progress Notes (Addendum)
History of Present Illness: This is a 77 year old male who relates he has had mild difficulties swallowing solid foods for many years. He states he chews his food very slowly. He has had a couple episodes of solid food dysphagia over the past few months. He also has a problem swallowing certain oblong-shaped pills. He had similar symptoms a year or 2 ago and he was evaluated by Dr. Karilyn Cota. He states he underwent a barium esophagram and upper endoscopy. He was told of a yeast infection and H. pylori. Denies weight loss, abdominal pain, constipation, diarrhea, change in stool caliber, melena, hematochezia, nausea, vomiting, reflux symptoms, chest pain.  Current Medications, Allergies, Past Medical History, Past Surgical History, Family History and Social History were reviewed in [employer] record.  Physical Exam: General: Well developed , well nourished, no acute distress Head: Normocephalic and atraumatic Eyes:  sclerae anicteric, EOMI Ears: Normal auditory acuity Mouth: No deformity or lesions Lungs: Clear throughout to auscultation Heart: Regular rate and rhythm; no murmurs, rubs or bruits Abdomen: Soft, non tender and non distended. No masses, hepatosplenomegaly or hernias noted. Normal Bowel sounds Musculoskeletal: Symmetrical with no gross deformities  Pulses:  Normal pulses noted Extremities: No clubbing, cyanosis, edema or deformities noted Neurological: Alert oriented x 4, grossly nonfocal Psychological:  Alert and cooperative. Normal mood and affect  Assessment and Recommendations:  1. Dysphagia, solids and pills, long-term. Attempt to obtain records from Dr. Karilyn Cota. May need BA esophagram and EGD. Further plans after review of records.  2. Personal history of adenomatous colon polyps. Surveillance colonoscopy recommended at 5 years, in January 2019.   11/26/2012 outside records received in reviewed from Dr. Karilyn Cota. Evaluation in 2010 for dysphagia. Barium  esophagram with tablet showed reflux but no evidence of stricture, no delay with a tablet. There was evidence of cervical degenerative spondylosis and prominent osteophytes at C4-C5 with indentation of the posterior cervical esophagus at that level. Upper endoscopy performed may 2010 showed Candida esophagitis and moderate gastric solid food retention. Will contact patient with these results. Begin a daily PPI for possible reflux related symptoms. Schedule EGD.

## 2012-11-27 ENCOUNTER — Telehealth: Payer: Self-pay

## 2012-11-27 DIAGNOSIS — R1319 Other dysphagia: Secondary | ICD-10-CM

## 2012-11-27 MED ORDER — OMEPRAZOLE 20 MG PO CPDR: 20.0000 mg | DELAYED_RELEASE_CAPSULE | Freq: Every day | ORAL | Status: DC

## 2012-11-27 NOTE — Telephone Encounter (Signed)
/  appts scheduled with his wife.  Pre-visit 1027/14 1:00 and endo 01/21/13 2:30

## 2012-11-27 NOTE — Telephone Encounter (Signed)
Message copied by Annett Fabian on Tue Nov 27, 2012  9:13 AM ------      Message from: Claudette Head T      Created: Mon Nov 26, 2012  1:02 PM       See addendum in bold type on my last office note regarding old record review, starting PPI and scheduling EGD with possible dilation. Please contact the patient with this info. ------

## 2012-11-29 ENCOUNTER — Encounter: Payer: Medicare Other | Admitting: Gastroenterology

## 2012-12-11 ENCOUNTER — Encounter: Payer: Self-pay | Admitting: Gastroenterology

## 2013-01-14 ENCOUNTER — Ambulatory Visit (AMBULATORY_SURGERY_CENTER): Payer: Self-pay

## 2013-01-14 VITALS — Ht 71.0 in | Wt 164.0 lb

## 2013-01-14 DIAGNOSIS — R1319 Other dysphagia: Secondary | ICD-10-CM

## 2013-01-21 ENCOUNTER — Encounter: Payer: Self-pay | Admitting: Gastroenterology

## 2013-01-21 ENCOUNTER — Ambulatory Visit (AMBULATORY_SURGERY_CENTER): Payer: Medicare Other | Admitting: Gastroenterology

## 2013-01-21 VITALS — BP 105/68 | HR 53 | Temp 96.5°F | Resp 22 | Ht 71.0 in | Wt 164.0 lb

## 2013-01-21 DIAGNOSIS — R1319 Other dysphagia: Secondary | ICD-10-CM

## 2013-01-21 DIAGNOSIS — K219 Gastro-esophageal reflux disease without esophagitis: Secondary | ICD-10-CM

## 2013-01-21 HISTORY — PX: UPPER GI ENDOSCOPY: SHX6162

## 2013-01-21 MED ORDER — SODIUM CHLORIDE 0.9 % IV SOLN: 500.0000 mL | INTRAVENOUS | Status: DC

## 2013-01-21 NOTE — Op Note (Signed)
[facility] [address] [place], [address]   ENDOSCOPY PROCEDURE REPORT  PATIENT: [name], [name]  MR#: [id number] BIRTHDATE: 25-Feb-1936 , 77  yrs. old GENDER: Male ENDOSCOPIST: Meryl Dare, MD, [facility] REFERRED [id number] Charm Barges PROCEDURE DATE:  01/21/2013 PROCEDURE:   EGD with dilatation over guidewire ASA CLASS:   Class II INDICATIONS:dysphagia and GERD. MEDICATIONS: MAC sedation, administered by CRNA and propofol (Diprivan) 200mg  IV TOPICAL ANESTHETIC:   Cetacaine Spray DESCRIPTION OF PROCEDURE:   After the risks benefits and alternatives of the procedure were thoroughly explained, informed consent was obtained.  The     endoscope was introduced through the mouth  and advanced to the descending duodenum ,    - none.  The instrument was slowly withdrawn as the mucosa was carefully examined.  ESOPHAGUS: Narrow cervical esophagus, appeared to be extrinsic, likely secondary to cervical spine disease noted on BA esophagra.. Mildly difficult intubation due to the narrowing.  The esophagus was otherwise normal. STOMACH: The mucosa and folds of the stomach appeared normal. Retroflexed views unremarkable. DUODENUM: The duodenal mucosa showed no abnormalities in the bulb and second portion of the duodenum.  Dilation was then performed of the total esophagus. Dilator:Savary over guidewire Size:15 mm, 16 mm  Reststance:minimal Heme:yes, minimal.  COMPLICATIONS: There were no complications.  ENDOSCOPIC IMPRESSION: 1.   Narrowed cervical esophagus, extrinsic 2.   The EGD otherwise appeared normal.  RECOMMENDATIONS: 1.  anti-reflux regimen 2.  continue PPI 3.  post dilation instructions  eSigned:  Meryl Dare, MD, [facility] 01/21/2013 3:01 PM

## 2013-01-21 NOTE — Progress Notes (Signed)
Patient did not experience any of the following events: a burn prior to discharge; a fall within the facility; wrong site/side/patient/procedure/implant event; or a hospital transfer or hospital admission upon discharge from the facility. (G8907) Patient did not have preoperative order for IV antibiotic SSI prophylaxis. (G8918)  

## 2013-01-21 NOTE — Patient Instructions (Signed)
Post-dilation diet, nothing by mouth until 3:50 pm, then clear liquid diet for 1 hour, then soft diet rest of today, and resume regular diet tomorrow. Resume current medications. Handouts given on dilation diet and anti-reflux/GERD. Call us with any questions or concerns. Thank you!!  YOU HAD AN ENDOSCOPIC PROCEDURE TODAY AT THE [facility]: Refer to the procedure report that was given to you for any specific questions about what was found during the examination.  If the procedure report does not answer your questions, please call your gastroenterologist to clarify.  If you requested that your care partner not be given the details of your procedure findings, then the procedure report has been included in a sealed envelope for you to review at your convenience later.  YOU SHOULD EXPECT: Some feelings of bloating in the abdomen. Passage of more gas than usual.  Walking can help get rid of the air that was put into your GI tract during the procedure and reduce the bloating. If you had a lower endoscopy (such as a colonoscopy or flexible sigmoidoscopy) you may notice spotting of blood in your stool or on the toilet paper. If you underwent a bowel prep for your procedure, then you may not have a normal bowel movement for a few days.  DIET:  Dilation diet today!!  Drink plenty of fluids but you should avoid alcoholic beverages for 24 hours.  ACTIVITY: Your care partner should take you home directly after the procedure.  You should plan to take it easy, moving slowly for the rest of the day.  You can resume normal activity the day after the procedure however you should NOT DRIVE or use heavy machinery for 24 hours (because of the sedation medicines used during the test).    SYMPTOMS TO REPORT IMMEDIATELY: A gastroenterologist can be reached at any hour.  During normal business hours, 8:30 AM to 5:00 PM Monday through Friday, call [phone].  After hours and on weekends, please call the GI  answering service at [phone] who will take a message and have the physician on call contact you.   Following lower endoscopy (colonoscopy or flexible sigmoidoscopy):  Excessive amounts of blood in the stool  Significant tenderness or worsening of abdominal pains  Swelling of the abdomen that is new, acute  Fever of 100F or higher  Following upper endoscopy (EGD)  Vomiting of blood or coffee ground material  New chest pain or pain under the shoulder blades  Painful or persistently difficult swallowing  New shortness of breath  Fever of 100F or higher  Black, tarry-looking stools  FOLLOW UP: If any biopsies were taken you will be contacted by phone or by letter within the next 1-3 weeks.  Call your gastroenterologist if you have not heard about the biopsies in 3 weeks.  Our staff will call the home number listed on your records the next business day following your procedure to check on you and address any questions or concerns that you may have at that time regarding the information given to you following your procedure. This is a courtesy call and so if there is no answer at the home number and we have not heard from you through the emergency physician on call, we will assume that you have returned to your regular daily activities without incident.  SIGNATURES/CONFIDENTIALITY: You and/or your care partner have signed paperwork which will be entered into your electronic medical record.  These signatures attest to the fact that that the information above  on your After Visit Summary has been reviewed and is understood.  Full responsibility of the confidentiality of this discharge information lies with you and/or your care-partner.

## 2013-01-21 NOTE — Progress Notes (Signed)
Called to room to assist during endoscopic procedure.  Patient ID and intended procedure confirmed with present staff. Received instructions for my participation in the procedure from the performing physician.  

## 2013-01-21 NOTE — Progress Notes (Signed)
Procedure ends, to recovery, report given and VSS. 

## 2013-01-22 ENCOUNTER — Telehealth: Payer: Self-pay | Admitting: *Deleted

## 2013-01-22 NOTE — Telephone Encounter (Signed)
  Follow up Call-  Call back number 01/21/2013 04/12/2012  Post procedure Call Back phone  # [phone]  Permission to leave phone message Yes Yes    LMOM

## 2013-01-30 ENCOUNTER — Encounter: Payer: Self-pay | Admitting: Family

## 2013-01-31 ENCOUNTER — Ambulatory Visit (HOSPITAL_COMMUNITY)
Admission: RE | Admit: 2013-01-31 | Discharge: 2013-01-31 | Disposition: A | Discharge status: Home / Self Care | Payer: Medicare Other | Source: Ambulatory Visit | Attending: Family | Admitting: Family

## 2013-01-31 ENCOUNTER — Encounter: Payer: Self-pay | Admitting: Family

## 2013-01-31 ENCOUNTER — Ambulatory Visit (INDEPENDENT_AMBULATORY_CARE_PROVIDER_SITE_OTHER): Payer: Medicare Other | Admitting: Family

## 2013-01-31 DIAGNOSIS — Z48812 Encounter for surgical aftercare following surgery on the circulatory system: Secondary | ICD-10-CM | POA: Insufficient documentation

## 2013-01-31 DIAGNOSIS — I6529 Occlusion and stenosis of unspecified carotid artery: Secondary | ICD-10-CM | POA: Insufficient documentation

## 2013-01-31 NOTE — Patient Instructions (Signed)
Stroke Prevention Some medical conditions and behaviors are associated with an increased chance of having a stroke. You may prevent a stroke by making healthy choices and managing medical conditions. Reduce your risk of having a stroke by:  Staying physically active. Get at least 30 minutes of activity on most or all days.  Not smoking. It may also be helpful to avoid exposure to secondhand smoke.  Limiting alcohol use. Moderate alcohol use is considered to be:  No more than 2 drinks per day for men.  No more than 1 drink per day for nonpregnant women.  Eating healthy foods.  Include 5 or more servings of fruits and vegetables a day.  Certain diets may be prescribed to address high blood pressure, high cholesterol, diabetes, or obesity.  Managing your cholesterol levels.  A low-saturated fat, low-trans fat, low-cholesterol, and high-fiber diet may control cholesterol levels.  Take any prescribed medicines to control cholesterol as directed by your caregiver.  Managing your diabetes.  A controlled-carbohydrate, controlled-sugar diet is recommended to manage diabetes.  Take any prescribed medicines to control diabetes as directed by your caregiver.  Controlling your high blood pressure (hypertension).  A low-salt (sodium), low-saturated fat, low-trans fat, and low-cholesterol diet is recommended to manage high blood pressure.  Take any prescribed medicines to control hypertension as directed by your caregiver.  Maintaining a healthy weight.  A reduced-calorie, low-sodium, low-saturated fat, low-trans fat, low-cholesterol diet is recommended to manage weight.  Stopping drug abuse.  Avoiding birth control pills.  Talk to your caregiver about the risks of taking birth control pills if you are over 35 years old, smoke, get migraines, or have ever had a blood clot.  Getting evaluated for sleep disorders (sleep apnea).  Talk to your caregiver about getting a sleep evaluation  if you snore a lot or have excessive sleepiness.  Taking medicines as directed by your caregiver.  For some people, aspirin or blood thinners (anticoagulants) are helpful in reducing the risk of forming abnormal blood clots that can lead to stroke. If you have the irregular heart rhythm of atrial fibrillation, you should be on a blood thinner unless there is a good reason you cannot take them.  Understand all your medicine instructions. SEEK IMMEDIATE MEDICAL CARE IF:   You have sudden weakness or numbness of the face, arm, or leg, especially on one side of the body.  You have sudden confusion.  You have trouble speaking (aphasia) or understanding.  You have sudden trouble seeing in one or both eyes.  You have sudden trouble walking.  You have dizziness.  You have a loss of balance or coordination.  You have a sudden, severe headache with no known cause.  You have new chest pain or an irregular heartbeat. Any of these symptoms may represent a serious problem that is an emergency. Do not wait to see if the symptoms will go away. Get medical help right away. Call your local emergency services (911 in [place] Do not drive yourself to the hospital. Document Released: 04/14/2004 Document Revised: 05/30/2011 Document Reviewed: 09/07/2012 [employer] Patient Information 2014 [employer].  

## 2013-01-31 NOTE — Progress Notes (Signed)
Established Carotid Patient  History of Present Illness  [name] is a 77 y.o. male patient of Dr. Hart [place] status post right CEA in 2007. His carotid stenosis was discovered by his PCP when he heard a bruit.  Patient has Negative history of TIA or stroke symptom.  The patient denies amaurosis fugax or monocular blindness.  The patient  denies facial drooping.  Pt. denies hemiplegia.  The patient denies receptive or expressive aphasia.  Pt. denies extremity weakness. Denies claudication symptoms, denies non-healing wounds.   Patient reports New Medical or Surgical History: esophogeal dilation and c-spine found to be pressing on the esophagus.  Pt Diabetic: No Pt smoker: former smoker, quit 16 years ago  Pt meds include: Statin : Yes ASA: Yes Other anticoagulants/antiplatelets: no   Past Medical History  Diagnosis Date  . Carotid artery occlusion   . Hyperlipidemia   . Hypertension   . Cancer     Prostate  . Blood transfusion without reported diagnosis   . Cataract   . Adenomatous colon polyp 11/1999  . Internal hemorrhoids     Social History History  Substance Use Topics  . Smoking status: Former Smoker -- 40 years    Types: Cigarettes    Quit date: 01/19/1997  . Smokeless tobacco: Never Used  . Alcohol Use: No    Family History Family History  Problem Relation Age of Onset  . Colon cancer Mother   . Cancer Mother     colon  . Heart disease Father     Heart Disease before age 41  . Heart attack Father 42  . Esophageal cancer Neg Hx   . Rectal cancer Neg Hx   . Stomach cancer Neg Hx     Surgical History Past Surgical History  Procedure Laterality Date  . Transurethral resection of prostate    . Appendectomy    . Cataract extraction      both eyes  . Colonoscopy    . Carotid surgery    . Upper gi endoscopy  Nov. 3, 2014    Upper endoscopy with diatation    No Known Allergies  Current Outpatient Prescriptions  Medication Sig Dispense Refill   . aspirin 81 MG tablet Take 81 mg by mouth 2 (two) times daily.       Marland Kitchen atorvastatin (LIPITOR) 10 MG tablet Take 10 mg by mouth daily.      . cetirizine (ZYRTEC) 10 MG tablet Take 10 mg by mouth daily.      . Cholecalciferol (VITAMIN D PO) Take 50,000 Units by mouth once a week.      . ezetimibe (ZETIA) 10 MG tablet Take 10 mg by mouth daily.      . fenofibrate (TRICOR) 145 MG tablet Take 134 mg by mouth daily.       Marland Kitchen omeprazole (PRILOSEC) 20 MG capsule Take 1 capsule (20 mg total) by mouth daily.  90 capsule  3  . ramipril (ALTACE) 10 MG tablet Take 10 mg by mouth daily.      . solifenacin (VESICARE) 10 MG tablet Take 10 mg by mouth daily.       No current facility-administered medications for this visit.    Review of Systems : [x]  Positive   [ ]  Denies  General:[ ]  Weight loss,  [ ]  Weight gain, [ ]  Loss of appetite, [ ]  Fever, [ ]  chills  Neurologic: [ ]  Dizziness, [ ]  Blackouts, [ ]  Headaches, [ ]  Seizure [ ]  Stroke, [ ]  "Mini  stroke", [ ]  Slurred speech, [ ]  Temporary blindness;  [ ] weakness,  Ear/Nose/Throat: [ ]  Change in hearing, [ ]  Nose bleeds, [ ]  Hoarseness  Vascular:[ ]  Pain in legs with walking, [ ]  Pain in feet while lying flat , [ ]   Non-healing ulcer, [ ]  Blood clot in vein,    Pulmonary: [ ]  Home oxygen, [ ]   Productive cough, [ ]  Bronchitis, [ ]  Coughing up blood,  [ ]  Asthma, [ ]  Wheezing  Musculoskeletal:  [ ]  Arthritis, [ ]  Joint pain, [ ]  low back pain  Cardiac: [ ]  Chest pain, [ ]  Shortness of breath when lying flat, [ ]  Shortness of breath with exertion, [ ]  Palpitations, [ ]  Heart murmur, [ ]   Atrial fibrillation  Hematologic:[ ]  Easy Bruising, [ ]  Anemia; [ ]  Hepatitis  Psychiatric: [ ]   Depression, [ ]  Anxiety   Gastrointestinal: [ ]  Black stool, [ ]  Blood in stool, [ ]  Peptic ulcer disease,  [ ]  Gastroesophageal Reflux, [ ]  Trouble swallowing, [ ]  Diarrhea, [ ]  Constipation  Urinary: [ ]  chronic Kidney disease, [ ]  on HD, [ ]  Burning with urination,  [ ]  Frequent urination, [ ]  Difficulty urinating;   Skin: [ ]  Rashes, [ ]  Wounds    Physical Examination  Filed Vitals:   01/31/13 1432  BP: 121/72  Pulse: 55  Resp: 16   Filed Weights   01/31/13 1432  Weight: 169 lb (76.658 kg)   Body mass index is 23.58 kg/(m^2).   General: WDWN male in NAD GAIT: normal Eyes: Pupils equal, no nystagmus or drift. Pulmonary:  CTAB, Negative  Rales, Negative rhonchi, & Negative wheezing.  Cardiac: regular Rhythm with occasional premature beats,  Negative Murmurs.  VASCULAR EXAM Carotid Bruits Left Right   Negative Negative     Radial pulses are 2+ palpable and equal.                                                                                                                            LE Pulses LEFT RIGHT       POPLITEAL   Not palpable   not palpable    Gastrointestinal: soft, nontender, BS WNL, no r/g,  negative masses.  Musculoskeletal: Positive muscle atrophy/wasting. M/S 5/5 throughout, Extremities without ischemic changes.  Neurologic: A&O X 3; Appropriate Affect ; SENSATION ;normal;  Speech is normal CN 2-12 intact , Pain and light touch intact in extremities, Motor exam as listed above.   Non-Invasive Vascular Imaging CAROTID DUPLEX 01/31/2013   Right ICA: patent CEA site. Left ICA: <40% stenosis.  These findings are Unchanged from previous exam.  Assessment: [name] is a 77 y.o. male who presents with asymptomatic patent right ICA which is the CEA site and <40% left ICA stenosis. The  ICA stenosis is  Unchanged from previous exam.  Plan: Follow-up in 1 year with Carotid Duplex scan.   I discussed in depth with the patient the nature  of atherosclerosis, and emphasized the importance of maximal medical management including strict control of blood pressure, blood glucose, and lipid levels, obtaining regular exercise, and continued cessation of smoking.  The patient is aware that without maximal medical  management the underlying atherosclerotic disease process will progress, limiting the benefit of any interventions. The patient was given information about stroke prevention and what symptoms should prompt the patient to seek immediate medical care. Thank you for allowing [place] to participate in this patient's care.  Charisse March, RN, MSN, FNP-C [facility] Office: [phone]  Clinic Physician: Darrick Penna 01/31/2013 2:55 PM

## 2013-02-05 ENCOUNTER — Other Ambulatory Visit: Payer: TRICARE For Life (TFL)

## 2013-02-05 ENCOUNTER — Ambulatory Visit: Payer: TRICARE For Life (TFL) | Admitting: Neurosurgery

## 2013-02-27 ENCOUNTER — Telehealth: Payer: Self-pay | Admitting: Gastroenterology

## 2013-02-28 NOTE — Telephone Encounter (Signed)
Left message for patient to call back  

## 2013-02-28 NOTE — Telephone Encounter (Signed)
Patient advised that he should continue on PPI long term.  He will call back for additional questions or concerns

## 2013-04-05 DIAGNOSIS — I10 Essential (primary) hypertension: Secondary | ICD-10-CM | POA: Diagnosis not present

## 2013-04-05 DIAGNOSIS — E785 Hyperlipidemia, unspecified: Secondary | ICD-10-CM | POA: Diagnosis not present

## 2013-04-05 DIAGNOSIS — C61 Malignant neoplasm of prostate: Secondary | ICD-10-CM | POA: Diagnosis not present

## 2013-04-05 DIAGNOSIS — E538 Deficiency of other specified B group vitamins: Secondary | ICD-10-CM | POA: Diagnosis not present

## 2013-04-05 DIAGNOSIS — D649 Anemia, unspecified: Secondary | ICD-10-CM | POA: Diagnosis not present

## 2013-04-05 DIAGNOSIS — I2510 Atherosclerotic heart disease of native coronary artery without angina pectoris: Secondary | ICD-10-CM | POA: Diagnosis not present

## 2013-05-13 ENCOUNTER — Other Ambulatory Visit: Payer: Self-pay

## 2013-05-13 MED ORDER — OMEPRAZOLE 20 MG PO CPDR: 20.0000 mg | DELAYED_RELEASE_CAPSULE | Freq: Every day | ORAL | Status: DC

## 2013-09-16 DIAGNOSIS — L03039 Cellulitis of unspecified toe: Secondary | ICD-10-CM | POA: Diagnosis not present

## 2013-09-16 DIAGNOSIS — M79609 Pain in unspecified limb: Secondary | ICD-10-CM | POA: Diagnosis not present

## 2013-10-04 ENCOUNTER — Other Ambulatory Visit: Payer: Self-pay | Admitting: Gastroenterology

## 2013-11-21 DIAGNOSIS — E785 Hyperlipidemia, unspecified: Secondary | ICD-10-CM | POA: Diagnosis not present

## 2013-11-21 DIAGNOSIS — I10 Essential (primary) hypertension: Secondary | ICD-10-CM | POA: Diagnosis not present

## 2013-11-21 DIAGNOSIS — D649 Anemia, unspecified: Secondary | ICD-10-CM | POA: Diagnosis not present

## 2013-11-21 DIAGNOSIS — I498 Other specified cardiac arrhythmias: Secondary | ICD-10-CM | POA: Diagnosis not present

## 2013-11-21 DIAGNOSIS — E538 Deficiency of other specified B group vitamins: Secondary | ICD-10-CM | POA: Diagnosis not present

## 2013-11-21 DIAGNOSIS — Z79899 Other long term (current) drug therapy: Secondary | ICD-10-CM | POA: Diagnosis not present

## 2013-11-29 ENCOUNTER — Encounter: Payer: Self-pay | Admitting: Cardiology

## 2013-11-29 ENCOUNTER — Ambulatory Visit (INDEPENDENT_AMBULATORY_CARE_PROVIDER_SITE_OTHER): Payer: Medicare Other | Admitting: Cardiology

## 2013-11-29 VITALS — BP 134/90 | HR 44 | Ht 70.5 in | Wt 171.8 lb

## 2013-11-29 DIAGNOSIS — I119 Hypertensive heart disease without heart failure: Secondary | ICD-10-CM

## 2013-11-29 DIAGNOSIS — I493 Ventricular premature depolarization: Secondary | ICD-10-CM

## 2013-11-29 DIAGNOSIS — R0989 Other specified symptoms and signs involving the circulatory and respiratory systems: Secondary | ICD-10-CM

## 2013-11-29 DIAGNOSIS — E785 Hyperlipidemia, unspecified: Secondary | ICD-10-CM

## 2013-11-29 DIAGNOSIS — R0609 Other forms of dyspnea: Secondary | ICD-10-CM | POA: Insufficient documentation

## 2013-11-29 DIAGNOSIS — I4949 Other premature depolarization: Secondary | ICD-10-CM | POA: Diagnosis not present

## 2013-11-29 NOTE — Patient Instructions (Signed)
Your physician recommends that you continue on your current medications as directed. Please refer to the Current Medication list given to you today.  Your physician has requested that you have an echocardiogram. Echocardiography is a painless test that uses sound waves to create images of your heart. It provides your doctor with information about the size and shape of your heart and how well your heart's chambers and valves are working. This procedure takes approximately one hour. There are no restrictions for this procedure.  Your physician has recommended that you wear a holter monitor. Holter monitors are medical devices that record the heart's electrical activity. Doctors most often use these monitors to diagnose arrhythmias. Arrhythmias are problems with the speed or rhythm of the heartbeat. The monitor is a small, portable device. You can wear one while you do your normal daily activities. This is usually used to diagnose what is causing palpitations/syncope (passing out). [place]  Follow up as needed

## 2013-11-29 NOTE — Progress Notes (Signed)
[name] Date of Birth:  05-14-1935 [address] [place], [place]  [address] [phone]        Fax   [phone]   History of Present Illness: This 78 year old gentleman is seen by me for the first time today.  He is seen at the request of his PCP Dr. Octavio Graves in [place].  He is seen for evaluation of bradycardia.  The patient states that he went in for a recent annual physical.  He was told that his heart rate was periodically slowing down into the 30s.  We don't have any EKGs available from that visit.  EKG today shows that he is in normal sinus rhythm at 67 per minute but has runs of bigeminy PVCs during which time his effective radial pulse drops into the 30s during his bigeminal phases.  He has had some mild exertional dyspnea but thinks it is no worse than other men his age.  He has not had any history of exertional chest pain or angina.  He does have a history of hypertension and has been on antihypertensive medication for more than 10 years.  He is also being treated for high cholesterol.  He is not diabetic.  In 2007 Dr. Stanford Breed found a right carotid bruit which led to vascular surgery by Dr. Victorino Dike and for correction of a high-grade right carotid artery stenosis which was asymptomatic.  Since then he is followed annually with carotid duplex most recently in November 2014.  He does have less than a 40% stenosis on the left and his right carotid artery remains clear. In regard to his PVCs, the patient does drink a moderate amount of caffeine.  He has not been aware of any racing of his heart or tachycardia.  He has not had any dizziness or syncope.  Current Outpatient Prescriptions  Medication Sig Dispense Refill  . aspirin 81 MG tablet Take 81 mg by mouth 2 (two) times daily.       Marland Kitchen atorvastatin (LIPITOR) 10 MG tablet Take 10 mg by mouth daily.      . cetirizine (ZYRTEC) 10 MG tablet Take 10 mg by mouth daily.      .  Cholecalciferol (VITAMIN D PO) Take 50,000 Units by mouth once a week.      . ezetimibe (ZETIA) 10 MG tablet Take 10 mg by mouth daily.      . fenofibrate (TRICOR) 145 MG tablet Take 134 mg by mouth daily.       Marland Kitchen omeprazole (PRILOSEC) 20 MG capsule TAKE 1 CAPSULE DAILY  90 capsule  0  . ramipril (ALTACE) 10 MG tablet Take 10 mg by mouth daily.      . solifenacin (VESICARE) 10 MG tablet Take 10 mg by mouth daily.       No current facility-administered medications for this visit.    No Known Allergies  Patient Active Problem List   Diagnosis Date Noted  . DOE (dyspnea on exertion) 11/29/2013  . Aftercare following surgery of the circulatory system, [place] 01/31/2013  . Occlusion and stenosis of carotid artery without mention of cerebral infarction 01/31/2012  . PROSTATE CANCER 08/20/2008  . HYPERLIPIDEMIA 08/20/2008  . ULNAR NEUROPATHY 08/20/2008  . HYPERTENSION 08/20/2008  . CAD 08/20/2008  . CEREBROVASCULAR DISEASE 08/20/2008  . EDEMA 08/20/2008  . DYSPNEA 08/20/2008  . CARPAL TUNNEL SYNDROME, HX OF 08/20/2008  . COPD 09/24/2007    History  Smoking status  . Former Smoker --  40 years  . Types: Cigarettes  . Quit date: 01/19/1997  Smokeless tobacco  . Never Used    History  Alcohol Use No    Family History  Problem Relation Age of Onset  . Colon cancer Mother   . Cancer Mother     colon  . Heart disease Father     Heart Disease before age 38  . Heart attack Father 58  . Esophageal cancer Neg Hx   . Rectal cancer Neg Hx   . Stomach cancer Neg Hx     Review of Systems: Constitutional: no fever chills diaphoresis or fatigue or change in weight.  Head and neck: no hearing loss, no epistaxis, no photophobia or visual disturbance. Respiratory: No cough, shortness of breath or wheezing. Cardiovascular: No chest pain peripheral edema, palpitations.  Asymptomatic PVCs and ventricular bigeminy  Gastrointestinal: No abdominal distention, no abdominal pain, no change in  bowel habits hematochezia or melena. Genitourinary: No dysuria, no frequency, no urgency, no nocturia. Musculoskeletal:No arthralgias, no back pain, no gait disturbance or myalgias. Neurological: No dizziness, no headaches, no numbness, no seizures, no syncope, no weakness, no tremors. Hematologic: No lymphadenopathy, no easy bruising. Psychiatric: No confusion, no hallucinations, no sleep disturbance.    Physical Exam: Filed Vitals:   11/29/13 1118  BP: 134/90  Pulse: 44  The patient appears to be in no distress.  Head and neck exam reveals that the pupils are equal and reactive.  The extraocular movements are full.  There is no scleral icterus.  Mouth and pharynx are benign.  No lymphadenopathy.  No carotid bruits.  The jugular venous pressure is normal.  Thyroid is not enlarged or tender.  Chest is clear to percussion and auscultation.  No rales or rhonchi.  Expansion of the chest is symmetrical.  Heart reveals no abnormal lift or heave.  First and second heart sounds are normal.  There is no murmur gallop rub or click.  Heart rate varies from 65 per minute when he is in normal sinus rhythm down to 32 per minute when he is in bigeminy.  The abdomen is soft and nontender.  Bowel sounds are normoactive.  There is no hepatosplenomegaly or mass.  There are no abdominal bruits.  Extremities reveal no phlebitis or edema.  Pedal pulses are good.  There is no cyanosis or clubbing.  Neurologic exam is normal strength and no lateralizing weakness.  No sensory deficits.  Integument reveals no rash  EKG shows normal sinus rhythm with ventricular bigeminy unifocal PVCs.  Native QRS beats show no ischemic changes.  Assessment / Plan: 1. asymptomatic PVCs resulting in slow radial pulse when he is in a run of bigeminy. 2. mild exertional dyspnea 3. hypertensive heart disease without heart failure 4. hypercholesterolemia on statin therapy  Plan: We will have him wear a 48 hour Holter monitor  to be sure that he is not having any worse arrhythmias than was seen today. We will obtain an echocardiogram to rule out significant structural heart disease as a cause of his mild exertional dyspnea. He was advised to reduce his caffeine intake.  He may continue normal activity.  He does not require any additional cardiac medications at this time.  Many thanks for the opportunity to see this pleasant gentleman.  We will be in touch with you regarding the results of his Holter monitor and his echo. Return here when necessary.

## 2013-12-05 ENCOUNTER — Ambulatory Visit (HOSPITAL_COMMUNITY): Payer: Medicare Other | Attending: Cardiology

## 2013-12-05 ENCOUNTER — Encounter (INDEPENDENT_AMBULATORY_CARE_PROVIDER_SITE_OTHER): Payer: Medicare Other

## 2013-12-05 ENCOUNTER — Encounter: Payer: Self-pay | Admitting: *Deleted

## 2013-12-05 DIAGNOSIS — C61 Malignant neoplasm of prostate: Secondary | ICD-10-CM | POA: Insufficient documentation

## 2013-12-05 DIAGNOSIS — R609 Edema, unspecified: Secondary | ICD-10-CM | POA: Insufficient documentation

## 2013-12-05 DIAGNOSIS — I4949 Other premature depolarization: Secondary | ICD-10-CM

## 2013-12-05 DIAGNOSIS — J449 Chronic obstructive pulmonary disease, unspecified: Secondary | ICD-10-CM | POA: Insufficient documentation

## 2013-12-05 DIAGNOSIS — E785 Hyperlipidemia, unspecified: Secondary | ICD-10-CM | POA: Diagnosis not present

## 2013-12-05 DIAGNOSIS — R0609 Other forms of dyspnea: Secondary | ICD-10-CM

## 2013-12-05 DIAGNOSIS — J4489 Other specified chronic obstructive pulmonary disease: Secondary | ICD-10-CM | POA: Insufficient documentation

## 2013-12-05 DIAGNOSIS — I7789 Other specified disorders of arteries and arterioles: Secondary | ICD-10-CM | POA: Diagnosis not present

## 2013-12-05 DIAGNOSIS — I2510 Atherosclerotic heart disease of native coronary artery without angina pectoris: Secondary | ICD-10-CM | POA: Insufficient documentation

## 2013-12-05 DIAGNOSIS — I493 Ventricular premature depolarization: Secondary | ICD-10-CM

## 2013-12-05 DIAGNOSIS — Z87891 Personal history of nicotine dependence: Secondary | ICD-10-CM | POA: Insufficient documentation

## 2013-12-05 DIAGNOSIS — I10 Essential (primary) hypertension: Secondary | ICD-10-CM | POA: Diagnosis not present

## 2013-12-05 NOTE — Progress Notes (Signed)
2D Echo completed. 12/05/2013

## 2013-12-05 NOTE — Progress Notes (Signed)
Patient ID: [name], male   DOB: [birth year], 78 y.o.   MRN: [id number] [employer] 48 hour holter monitor applied to patient.

## 2013-12-13 ENCOUNTER — Telehealth: Payer: Self-pay | Admitting: Cardiology

## 2013-12-13 NOTE — Telephone Encounter (Signed)
New message          Pt is calling for results from recent monitor

## 2013-12-13 NOTE — Telephone Encounter (Signed)
Spoke with pt, aware results are not ready yet. Will call the patient once those results are available. Patient voiced understanding

## 2013-12-15 ENCOUNTER — Other Ambulatory Visit: Payer: Self-pay | Admitting: Gastroenterology

## 2013-12-16 NOTE — Telephone Encounter (Signed)
48 hour monitor,  Dr. Sherryl Barters interpretation  Frequent PVC's, occasional paired PVC's. No significant bradycardia.  Advised patient, will forward to Dr Ferd Hibbs

## 2013-12-18 ENCOUNTER — Ambulatory Visit: Payer: Medicare Other | Admitting: Internal Medicine

## 2013-12-18 NOTE — Telephone Encounter (Signed)
Faxed results to Dr Melina Copa, PCP 1st 24 hour below,  2nd 24 hour not documented in computer,  Dr. Sherryl Barters interpretation:  Long runs of ventricular bigeminy, occasional short run of SVT. No significant arrythmia.

## 2014-01-02 DIAGNOSIS — Z23 Encounter for immunization: Secondary | ICD-10-CM | POA: Diagnosis not present

## 2014-02-05 ENCOUNTER — Encounter: Payer: Self-pay | Admitting: Family

## 2014-02-06 ENCOUNTER — Ambulatory Visit (INDEPENDENT_AMBULATORY_CARE_PROVIDER_SITE_OTHER): Payer: Medicare Other | Admitting: Family

## 2014-02-06 ENCOUNTER — Encounter: Payer: Self-pay | Admitting: Family

## 2014-02-06 ENCOUNTER — Ambulatory Visit (HOSPITAL_COMMUNITY)
Admission: RE | Admit: 2014-02-06 | Discharge: 2014-02-06 | Disposition: A | Discharge status: Home / Self Care | Payer: Medicare Other | Source: Ambulatory Visit | Attending: Family | Admitting: Family

## 2014-02-06 VITALS — BP 118/70 | HR 57 | Resp 14 | Ht 70.0 in | Wt 173.0 lb

## 2014-02-06 DIAGNOSIS — I6523 Occlusion and stenosis of bilateral carotid arteries: Secondary | ICD-10-CM

## 2014-02-06 DIAGNOSIS — Z9889 Other specified postprocedural states: Secondary | ICD-10-CM | POA: Diagnosis not present

## 2014-02-06 DIAGNOSIS — Z48812 Encounter for surgical aftercare following surgery on the circulatory system: Secondary | ICD-10-CM

## 2014-02-06 NOTE — Patient Instructions (Signed)
Stroke Prevention Some medical conditions and behaviors are associated with an increased chance of having a stroke. You may prevent a stroke by making healthy choices and managing medical conditions. HOW CAN I REDUCE MY RISK OF HAVING A STROKE?   Stay physically active. Get at least 30 minutes of activity on most or all days.  Do not smoke. It may also be helpful to avoid exposure to secondhand smoke.  Limit alcohol use. Moderate alcohol use is considered to be:  No more than 2 drinks per day for men.  No more than 1 drink per day for nonpregnant women.  Eat healthy foods. This involves:  Eating 5 or more servings of fruits and vegetables a day.  Making dietary changes that address high blood pressure (hypertension), high cholesterol, diabetes, or obesity.  Manage your cholesterol levels.  Making food choices that are high in fiber and low in saturated fat, trans fat, and cholesterol may control cholesterol levels.  Take any prescribed medicines to control cholesterol as directed by your health care provider.  Manage your diabetes.  Controlling your carbohydrate and sugar intake is recommended to manage diabetes.  Take any prescribed medicines to control diabetes as directed by your health care provider.  Control your hypertension.  Making food choices that are low in salt (sodium), saturated fat, trans fat, and cholesterol is recommended to manage hypertension.  Take any prescribed medicines to control hypertension as directed by your health care provider.  Maintain a healthy weight.  Reducing calorie intake and making food choices that are low in sodium, saturated fat, trans fat, and cholesterol are recommended to manage weight.  Stop drug abuse.  Avoid taking birth control pills.  Talk to your health care provider about the risks of taking birth control pills if you are over 35 years old, smoke, get migraines, or have ever had a blood clot.  Get evaluated for sleep  disorders (sleep apnea).  Talk to your health care provider about getting a sleep evaluation if you snore a lot or have excessive sleepiness.  Take medicines only as directed by your health care provider.  For some people, aspirin or blood thinners (anticoagulants) are helpful in reducing the risk of forming abnormal blood clots that can lead to stroke. If you have the irregular heart rhythm of atrial fibrillation, you should be on a blood thinner unless there is a good reason you cannot take them.  Understand all your medicine instructions.  Make sure that other conditions (such as anemia or atherosclerosis) are addressed. SEEK IMMEDIATE MEDICAL CARE IF:   You have sudden weakness or numbness of the face, arm, or leg, especially on one side of the body.  Your face or eyelid droops to one side.  You have sudden confusion.  You have trouble speaking (aphasia) or understanding.  You have sudden trouble seeing in one or both eyes.  You have sudden trouble walking.  You have dizziness.  You have a loss of balance or coordination.  You have a sudden, severe headache with no known cause.  You have new chest pain or an irregular heartbeat. Any of these symptoms may represent a serious problem that is an emergency. Do not wait to see if the symptoms will go away. Get medical help at once. Call your local emergency services (911 in [place] Do not drive yourself to the hospital. Document Released: 04/14/2004 Document Revised: 07/22/2013 Document Reviewed: 09/07/2012 [employer] Patient Information 2015 [employer]. This information is not intended to replace advice given   to you by your health care provider. Make sure you discuss any questions you have with your health care provider.  

## 2014-02-06 NOTE — Addendum Note (Signed)
Addended by: Mena Goes on: 02/06/2014 04:28 PM   Modules accepted: Orders

## 2014-02-06 NOTE — Progress Notes (Signed)
Established Carotid Patient   History of Present Illness  [name] is a 78 y.o. male patient of Dr. Kellie Simmering status post right CEA in 2007. His carotid stenosis was discovered by his PCP when he heard a bruit.  Patient has Negative history of TIA or stroke symptom. The patient denies amaurosis fugax or monocular blindness. The patient denies facial drooping. Pt. denies hemiplegia. The patient denies receptive or expressive aphasia. Pt. denies extremity weakness. Denies claudication symptoms, denies non-healing wounds.  Patient reports New Medical or Surgical History: he saw a cardiologist for PVC's, stopped drinking caffeine and he has had almost no PVC's since then   Pt Diabetic: No Pt smoker: former smoker, quit 17 years ago  Pt meds include: Statin : Yes ASA: Yes Other anticoagulants/antiplatelets: no  Past Medical History  Diagnosis Date  . Carotid artery occlusion   . Hyperlipidemia   . Hypertension   . Cancer     Prostate  . Blood transfusion without reported diagnosis   . Cataract   . Adenomatous colon polyp 11/1999  . Internal hemorrhoids     Social History History  Substance Use Topics  . Smoking status: Former Smoker -- 40 years    Types: Cigarettes    Quit date: 01/19/1997  . Smokeless tobacco: Never Used  . Alcohol Use: No    Family History Family History  Problem Relation Age of Onset  . Colon cancer Mother   . Cancer Mother     colon  . Heart disease Father     Heart Disease before age 40  . Heart attack Father 44  . Esophageal cancer Neg Hx   . Rectal cancer Neg Hx   . Stomach cancer Neg Hx     Surgical History Past Surgical History  Procedure Laterality Date  . Transurethral resection of prostate    . Appendectomy    . Cataract extraction      both eyes  . Colonoscopy    . Carotid surgery    . Upper gi endoscopy  Nov. 3, 2014    Upper endoscopy with diatation    No Known Allergies  Current Outpatient Prescriptions   Medication Sig Dispense Refill  . aspirin 81 MG tablet Take 81 mg by mouth 2 (two) times daily.     Marland Kitchen atorvastatin (LIPITOR) 10 MG tablet Take 10 mg by mouth daily.    . cetirizine (ZYRTEC) 10 MG tablet Take 10 mg by mouth daily.    . Cholecalciferol (VITAMIN D PO) Take 50,000 Units by mouth once a week.    . ezetimibe (ZETIA) 10 MG tablet Take 10 mg by mouth daily.    . fenofibrate (TRICOR) 145 MG tablet Take 134 mg by mouth daily.     Marland Kitchen omeprazole (PRILOSEC) 20 MG capsule TAKE 1 CAPSULE DAILY 90 capsule 0  . ramipril (ALTACE) 10 MG tablet Take 10 mg by mouth daily.    . solifenacin (VESICARE) 10 MG tablet Take 10 mg by mouth daily.     No current facility-administered medications for this visit.    Review of Systems : See HPI for pertinent positives and negatives.  Physical Examination  Filed Vitals:   02/06/14 1519 02/06/14 1521  BP: 117/72 118/70  Pulse: 57 57  Resp:  14  Height:  5\' 10"  (1.778 m)  Weight:  173 lb (78.472 kg)  SpO2:  99%   Body mass index is 24.82 kg/(m^2).  General: WDWN male in NAD GAIT: normal Eyes: Pupils equal and reactive  to light. Pulmonary: CTAB, Negative Rales, Negative rhonchi, & Negative wheezing.  Cardiac: regular Rhythm with occasional premature beats,no detected murmur  VASCULAR EXAM Carotid Bruits Left Right   Negative Negative    Radial pulses are 2+ palpable and equal.      LE Pulses LEFT RIGHT   POPLITEAL  Not palpable  not palpable    Gastrointestinal: soft, nontender, BS WNL, no r/g,no palpable masses.  Musculoskeletal: Negative muscle atrophy/wasting. M/S 5/5 throughout, Extremities without ischemic changes.  Neurologic: A&O X 3; Appropriate Affect ; SENSATION ;normal;  Speech is normal CN 2-12 intact , Pain and light touch intact in extremities, Motor exam as  listed above.    Non-Invasive Vascular Imaging CAROTID DUPLEX 02/06/2014   CEREBROVASCULAR DUPLEX EVALUATION    INDICATION: Carotid stenosis    PREVIOUS INTERVENTION(S): Right carotid endarterectomy 11/04/2005.    DUPLEX EXAM:     RIGHT  LEFT  Peak Systolic Velocities (cm/s) End Diastolic Velocities (cm/s) Plaque LOCATION Peak Systolic Velocities (cm/s) End Diastolic Velocities (cm/s) Plaque  148 30  CCA PROXIMAL 140 31   77 23  CCA MID 103 29   103 26  CCA DISTAL 112 24 HT  119 20  ECA 109 13 HT  68 22 HM ICA PROXIMAL 102 24 HT  98 34  ICA MID 86 33   97 36  ICA DISTAL 65 8     carotid endarterectomy ICA / CCA Ratio (PSV) 0.99  Antegrade Vertebral Flow Antegrade  559 Brachial Systolic Pressure (mmHg) 741  Triphasic Brachial Artery Waveforms Triphasic    Plaque Morphology:  HM = Homogeneous, HT = Heterogeneous, CP = Calcific Plaque, SP = Smooth Plaque, IP = Irregular Plaque     ADDITIONAL FINDINGS:     IMPRESSION: Right internal carotid artery is patent with history of carotid endarterectomy, mild hyperplasia present at the mid/distal patch without hemodynamically significant changes present. Left internal carotid artery stenosis present of less than 40%.    Compared to the previous exam:  Unchanged since previous study on 01/31/2013.      Assessment: [name] is a 78 y.o. male who status post right CEA in 2007. He presents with asymptomatic patient right ICA with history of carotid endarterectomy, mild hyperplasia present at the mid/distal patch without hemodynamically significant changes present and minimal stenosis in the left ICA.   Plan: Follow-up in 1 year with Carotid Duplex.   I discussed in depth with the patient the nature of atherosclerosis, and emphasized the importance of maximal medical management including strict control of blood pressure, blood glucose, and lipid levels, obtaining regular exercise, and continued cessation of smoking.  The  patient is aware that without maximal medical management the underlying atherosclerotic disease process will progress, limiting the benefit of any interventions. The patient was given information about stroke prevention and what symptoms should prompt the patient to seek immediate medical care. Thank you for allowing [place] to participate in this patient's care.  Clemon Chambers, RN, MSN, FNP-C [facility] Office: [phone]  Clinic Physician: Ruta Hinds  02/06/2014 3:27 PM

## 2014-02-26 ENCOUNTER — Other Ambulatory Visit: Payer: Self-pay | Admitting: Gastroenterology

## 2014-06-12 DIAGNOSIS — D649 Anemia, unspecified: Secondary | ICD-10-CM | POA: Diagnosis not present

## 2014-06-12 DIAGNOSIS — E559 Vitamin D deficiency, unspecified: Secondary | ICD-10-CM | POA: Diagnosis not present

## 2014-06-12 DIAGNOSIS — E784 Other hyperlipidemia: Secondary | ICD-10-CM | POA: Diagnosis not present

## 2014-06-12 DIAGNOSIS — I10 Essential (primary) hypertension: Secondary | ICD-10-CM | POA: Diagnosis not present

## 2014-06-12 DIAGNOSIS — Z79899 Other long term (current) drug therapy: Secondary | ICD-10-CM | POA: Diagnosis not present

## 2014-06-23 ENCOUNTER — Other Ambulatory Visit: Payer: Self-pay

## 2014-07-09 DIAGNOSIS — X32XXXD Exposure to sunlight, subsequent encounter: Secondary | ICD-10-CM | POA: Diagnosis not present

## 2014-07-09 DIAGNOSIS — L570 Actinic keratosis: Secondary | ICD-10-CM | POA: Diagnosis not present

## 2014-07-09 DIAGNOSIS — D225 Melanocytic nevi of trunk: Secondary | ICD-10-CM | POA: Diagnosis not present

## 2014-09-26 DIAGNOSIS — M25512 Pain in left shoulder: Secondary | ICD-10-CM | POA: Diagnosis not present

## 2014-09-26 DIAGNOSIS — Z6824 Body mass index (BMI) 24.0-24.9, adult: Secondary | ICD-10-CM | POA: Diagnosis not present

## 2014-09-26 DIAGNOSIS — I10 Essential (primary) hypertension: Secondary | ICD-10-CM | POA: Diagnosis not present

## 2014-10-07 DIAGNOSIS — M25512 Pain in left shoulder: Secondary | ICD-10-CM | POA: Diagnosis not present

## 2014-10-07 DIAGNOSIS — Z6824 Body mass index (BMI) 24.0-24.9, adult: Secondary | ICD-10-CM | POA: Diagnosis not present

## 2014-10-07 DIAGNOSIS — I10 Essential (primary) hypertension: Secondary | ICD-10-CM | POA: Diagnosis not present

## 2014-12-19 DIAGNOSIS — Z23 Encounter for immunization: Secondary | ICD-10-CM | POA: Diagnosis not present

## 2015-01-08 DIAGNOSIS — L570 Actinic keratosis: Secondary | ICD-10-CM | POA: Diagnosis not present

## 2015-01-08 DIAGNOSIS — D649 Anemia, unspecified: Secondary | ICD-10-CM | POA: Diagnosis not present

## 2015-01-08 DIAGNOSIS — E784 Other hyperlipidemia: Secondary | ICD-10-CM | POA: Diagnosis not present

## 2015-01-08 DIAGNOSIS — Z79899 Other long term (current) drug therapy: Secondary | ICD-10-CM | POA: Diagnosis not present

## 2015-01-08 DIAGNOSIS — I10 Essential (primary) hypertension: Secondary | ICD-10-CM | POA: Diagnosis not present

## 2015-01-08 DIAGNOSIS — C61 Malignant neoplasm of prostate: Secondary | ICD-10-CM | POA: Diagnosis not present

## 2015-01-08 DIAGNOSIS — E538 Deficiency of other specified B group vitamins: Secondary | ICD-10-CM | POA: Diagnosis not present

## 2015-01-08 DIAGNOSIS — J301 Allergic rhinitis due to pollen: Secondary | ICD-10-CM | POA: Diagnosis not present

## 2015-01-08 DIAGNOSIS — I2510 Atherosclerotic heart disease of native coronary artery without angina pectoris: Secondary | ICD-10-CM | POA: Diagnosis not present

## 2015-01-08 DIAGNOSIS — E559 Vitamin D deficiency, unspecified: Secondary | ICD-10-CM | POA: Diagnosis not present

## 2015-01-08 DIAGNOSIS — N3281 Overactive bladder: Secondary | ICD-10-CM | POA: Diagnosis not present

## 2015-01-08 DIAGNOSIS — E539 Vitamin B deficiency, unspecified: Secondary | ICD-10-CM | POA: Diagnosis not present

## 2015-01-23 DIAGNOSIS — H00011 Hordeolum externum right upper eyelid: Secondary | ICD-10-CM | POA: Diagnosis not present

## 2015-02-17 ENCOUNTER — Encounter (HOSPITAL_COMMUNITY): Payer: Medicare Other

## 2015-02-17 ENCOUNTER — Ambulatory Visit: Payer: Medicare Other | Admitting: Family

## 2015-02-18 ENCOUNTER — Encounter: Payer: Self-pay | Admitting: Family

## 2015-02-20 ENCOUNTER — Encounter: Payer: Self-pay | Admitting: Family

## 2015-02-20 ENCOUNTER — Ambulatory Visit (INDEPENDENT_AMBULATORY_CARE_PROVIDER_SITE_OTHER): Payer: Medicare Other | Admitting: Family

## 2015-02-20 ENCOUNTER — Ambulatory Visit (HOSPITAL_COMMUNITY)
Admission: RE | Admit: 2015-02-20 | Discharge: 2015-02-20 | Disposition: A | Discharge status: Home / Self Care | Payer: Medicare Other | Source: Ambulatory Visit | Attending: Family | Admitting: Family

## 2015-02-20 VITALS — BP 114/67 | HR 59 | Temp 97.0°F | Resp 16 | Ht 70.0 in | Wt 175.0 lb

## 2015-02-20 DIAGNOSIS — Z48812 Encounter for surgical aftercare following surgery on the circulatory system: Secondary | ICD-10-CM

## 2015-02-20 DIAGNOSIS — Z9889 Other specified postprocedural states: Secondary | ICD-10-CM | POA: Diagnosis not present

## 2015-02-20 DIAGNOSIS — I6522 Occlusion and stenosis of left carotid artery: Secondary | ICD-10-CM | POA: Diagnosis not present

## 2015-02-20 DIAGNOSIS — E785 Hyperlipidemia, unspecified: Secondary | ICD-10-CM | POA: Diagnosis not present

## 2015-02-20 DIAGNOSIS — I10 Essential (primary) hypertension: Secondary | ICD-10-CM | POA: Insufficient documentation

## 2015-02-20 DIAGNOSIS — Z87891 Personal history of nicotine dependence: Secondary | ICD-10-CM | POA: Diagnosis not present

## 2015-02-20 NOTE — Progress Notes (Signed)
Chief Complaint: Carotid Artery Stenosis   History of Present Illness  [name] is a 79 y.o. male  patient of Dr. Kellie Simmering who is status post right CEA in 2007. His carotid stenosis was discovered by his PCP when he heard a bruit.  He has no history of TIA or stroke symptoms.Specifically the patient denies a history of amaurosis fugax or monocular blindness, unilateral facial drooping, hemiplegia, or receptive or expressive aphasia.  He denies claudication symptoms with walking, denies non-healing wounds. He works part time and stays physically active.  Patient reports New Medical or Surgical History: no He saw a cardiologist for PVC's, stopped drinking caffeine and he has had almost no PVC's since then.  Pt Diabetic: No Pt smoker: former smoker, quit in 1998, smoked for 40 years  Pt meds include: Statin : Yes ASA: Yes Other anticoagulants/antiplatelets: no   Past Medical History  Diagnosis Date  . Carotid artery occlusion   . Hyperlipidemia   . Hypertension   . Cancer     Prostate  . Blood transfusion without reported diagnosis   . Cataract   . Adenomatous colon polyp 11/1999  . Internal hemorrhoids     Social History Social History  Substance Use Topics  . Smoking status: Former Smoker -- 40 years    Types: Cigarettes    Quit date: 01/19/1997  . Smokeless tobacco: Never Used  . Alcohol Use: No    Family History Family History  Problem Relation Age of Onset  . Colon cancer Mother   . Cancer Mother     colon  . Heart disease Father     Heart Disease before age 42  . Heart attack Father 63  . Esophageal cancer Neg Hx   . Rectal cancer Neg Hx   . Stomach cancer Neg Hx     Surgical History Past Surgical History  Procedure Laterality Date  . Transurethral resection of prostate    . Appendectomy    . Cataract extraction      both eyes  . Colonoscopy    . Carotid surgery    . Upper gi endoscopy  Nov. 3, 2014    Upper endoscopy with diatation     No Known Allergies  Current Outpatient Prescriptions  Medication Sig Dispense Refill  . aspirin 81 MG tablet Take 81 mg by mouth 2 (two) times daily.     Marland Kitchen atorvastatin (LIPITOR) 10 MG tablet Take 10 mg by mouth daily.    . cetirizine (ZYRTEC) 10 MG tablet Take 10 mg by mouth daily.    . Cholecalciferol (VITAMIN D PO) Take 50,000 Units by mouth once a week.    . ezetimibe (ZETIA) 10 MG tablet Take 10 mg by mouth daily.    . fenofibrate (TRICOR) 145 MG tablet Take 134 mg by mouth daily.     Marland Kitchen omeprazole (PRILOSEC) 20 MG capsule TAKE 1 CAPSULE DAILY 90 capsule 0  . ramipril (ALTACE) 10 MG tablet Take 10 mg by mouth daily.    . solifenacin (VESICARE) 10 MG tablet Take 10 mg by mouth daily.     No current facility-administered medications for this visit.    Review of Systems : See HPI for pertinent positives and negatives.  Physical Examination  Filed Vitals:   02/20/15 1451 02/20/15 1454  BP: 115/69 114/67  Pulse: 62 59  Temp:  97 F (36.1 C)  TempSrc:  Oral  Resp:  16  Height:  5\' 10"  (1.778 m)  Weight:  175 lb (  79.379 kg)  SpO2:  99%   Body mass index is 25.11 kg/(m^2).   General: WDWN male in NAD GAIT: normal Eyes: Pupils equal and reactive to light. Pulmonary: CTAB, no rales,  rhonchi, or wheezing.  Cardiac: regular rhythm,no detected murmur  VASCULAR EXAM Carotid Bruits Left Right   positive Negative    Radial pulses are 2+ palpable and equal.      LE Pulses LEFT RIGHT   POPLITEAL  Not palpable  not palpable       PT palpable palpable       DP Not palpable Not palpable    Gastrointestinal: soft, nontender, BS WNL, no r/g,no palpable masses.  Musculoskeletal: No muscle atrophy/wasting. M/S 5/5 throughout, extremities without ischemic changes.  Neurologic: A&O X 3; Appropriate Affect,  normal sensation, Speech is normal CN 2-12 intact , Pain and light touch intact in extremities, Motor exam as listed above.          Non-Invasive Vascular Imaging CAROTID DUPLEX 02/20/2015   CEREBROVASCULAR DUPLEX EVALUATION    INDICATION: Carotid stenosis    PREVIOUS INTERVENTION(S): Right carotid endarterectomy 11/04/2005.    DUPLEX EXAM:     RIGHT  LEFT  Peak Systolic Velocities (cm/s) End Diastolic Velocities (cm/s) Plaque LOCATION Peak Systolic Velocities (cm/s) End Diastolic Velocities (cm/s) Plaque  86 18  CCA PROXIMAL 109 26   71 12  CCA MID 83 22   75 20  CCA DISTAL 90 24 HT  80 11  ECA 96 11 HT  57 11 HM ICA PROXIMAL 75 25 HT  42 16  ICA MID 58 26   55 21  ICA DISTAL 61 25     carotid endarterectomy ICA / CCA Ratio (PSV)   Antegrade Vertebral Flow Antegrade   Brachial Systolic Pressure (mmHg)   Triphasic Brachial Artery Waveforms Triphasic    Plaque Morphology:  HM = Homogeneous, HT = Heterogeneous, CP = Calcific Plaque, SP = Smooth Plaque, IP = Irregular Plaque  ADDITIONAL FINDINGS:     IMPRESSION: Right internal carotid artery is patent with history of carotid endarterectomy, mild hyperplasia present at the mid/distal patch without hemodynamically significant changes present. Left internal carotid artery stenosis present of less than 40%.    Compared to the previous exam:  Unchanged since previous study on 02/06/2014.      Assessment: [name] is a 79 y.o. male who is status post right CEA in 2007. He has no history of stroke or TIA.  Today's carotid duplex suggests a patent right ICA  with history of carotid endarterectomy, mild hyperplasia present at the mid/distal patch without hemodynamically significant changes. Left internal carotid artery stenosis present of less than 40%. Unchanged since previous study on 02/06/2014.   Plan: Follow-up in 1 year with Carotid Duplex scan.   I discussed in depth with the patient the nature of  atherosclerosis, and emphasized the importance of maximal medical management including strict control of blood pressure, blood glucose, and lipid levels, obtaining regular exercise, and continued cessation of smoking.  The patient is aware that without maximal medical management the underlying atherosclerotic disease process will progress, limiting the benefit of any interventions. The patient was given information about stroke prevention and what symptoms should prompt the patient to seek immediate medical care. Thank you for allowing [place] to participate in this patient's care.  Clemon Chambers, RN, MSN, FNP-C [facility] [place] Office: [phone]  Clinic Physician: Bridgett Larsson  02/20/2015 2:44 PM

## 2015-02-20 NOTE — Patient Instructions (Signed)
Stroke Prevention Some medical conditions and behaviors are associated with an increased chance of having a stroke. You may prevent a stroke by making healthy choices and managing medical conditions. HOW CAN I REDUCE MY RISK OF HAVING A STROKE?   Stay physically active. Get at least 30 minutes of activity on most or all days.  Do not smoke. It may also be helpful to avoid exposure to secondhand smoke.  Limit alcohol use. Moderate alcohol use is considered to be:  No more than 2 drinks per day for men.  No more than 1 drink per day for nonpregnant women.  Eat healthy foods. This involves:  Eating 5 or more servings of fruits and vegetables a day.  Making dietary changes that address high blood pressure (hypertension), high cholesterol, diabetes, or obesity.  Manage your cholesterol levels.  Making food choices that are high in fiber and low in saturated fat, trans fat, and cholesterol may control cholesterol levels.  Take any prescribed medicines to control cholesterol as directed by your health care provider.  Manage your diabetes.  Controlling your carbohydrate and sugar intake is recommended to manage diabetes.  Take any prescribed medicines to control diabetes as directed by your health care provider.  Control your hypertension.  Making food choices that are low in salt (sodium), saturated fat, trans fat, and cholesterol is recommended to manage hypertension.  Ask your health care provider if you need treatment to lower your blood pressure. Take any prescribed medicines to control hypertension as directed by your health care provider.  If you are 18-39 years of age, have your blood pressure checked every 3-5 years. If you are 40 years of age or older, have your blood pressure checked every year.  Maintain a healthy weight.  Reducing calorie intake and making food choices that are low in sodium, saturated fat, trans fat, and cholesterol are recommended to manage  weight.  Stop drug abuse.  Avoid taking birth control pills.  Talk to your health care provider about the risks of taking birth control pills if you are over 35 years old, smoke, get migraines, or have ever had a blood clot.  Get evaluated for sleep disorders (sleep apnea).  Talk to your health care provider about getting a sleep evaluation if you snore a lot or have excessive sleepiness.  Take medicines only as directed by your health care provider.  For some people, aspirin or blood thinners (anticoagulants) are helpful in reducing the risk of forming abnormal blood clots that can lead to stroke. If you have the irregular heart rhythm of atrial fibrillation, you should be on a blood thinner unless there is a good reason you cannot take them.  Understand all your medicine instructions.  Make sure that other conditions (such as anemia or atherosclerosis) are addressed. SEEK IMMEDIATE MEDICAL CARE IF:   You have sudden weakness or numbness of the face, arm, or leg, especially on one side of the body.  Your face or eyelid droops to one side.  You have sudden confusion.  You have trouble speaking (aphasia) or understanding.  You have sudden trouble seeing in one or both eyes.  You have sudden trouble walking.  You have dizziness.  You have a loss of balance or coordination.  You have a sudden, severe headache with no known cause.  You have new chest pain or an irregular heartbeat. Any of these symptoms may represent a serious problem that is an emergency. Do not wait to see if the symptoms will   go away. Get medical help at once. Call your local emergency services (911 in [place] Do not drive yourself to the hospital.   This information is not intended to replace advice given to you by your health care provider. Make sure you discuss any questions you have with your health care provider.   Document Released: 04/14/2004 Document Revised: 03/28/2014 Document Reviewed:  09/07/2012 Elsevier Interactive Patient Education 2016 [employer]  

## 2015-04-01 DIAGNOSIS — Z6825 Body mass index (BMI) 25.0-25.9, adult: Secondary | ICD-10-CM | POA: Diagnosis not present

## 2015-04-01 DIAGNOSIS — M25512 Pain in left shoulder: Secondary | ICD-10-CM | POA: Diagnosis not present

## 2015-04-01 DIAGNOSIS — I10 Essential (primary) hypertension: Secondary | ICD-10-CM | POA: Diagnosis not present

## 2015-04-20 ENCOUNTER — Ambulatory Visit (INDEPENDENT_AMBULATORY_CARE_PROVIDER_SITE_OTHER): Payer: Medicare Other | Admitting: Gastroenterology

## 2015-04-20 ENCOUNTER — Encounter: Payer: Self-pay | Admitting: Gastroenterology

## 2015-04-20 VITALS — BP 124/60 | HR 64 | Ht 69.0 in | Wt 175.5 lb

## 2015-04-20 DIAGNOSIS — K219 Gastro-esophageal reflux disease without esophagitis: Secondary | ICD-10-CM

## 2015-04-20 MED ORDER — OMEPRAZOLE 20 MG PO CPDR: 20.0000 mg | DELAYED_RELEASE_CAPSULE | Freq: Every day | ORAL | Status: DC

## 2015-04-20 NOTE — Patient Instructions (Signed)
We have sent the following prescriptions to your mail in pharmacy: omeprazole.   If you have not heard from your mail in pharmacy within 1 week or if you have not received your medication in the mail, please contact us at [phone] so we may find out why.  Thank you for choosing me and [facility].  Malcolm T. Stark, Jr., MD., FACG  

## 2015-04-20 NOTE — Progress Notes (Signed)
    History of Present Illness: This is a 80 year old male with a history of GERD. 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 His dysphagia symptoms improved after dilation performed in 2014. He has no gastrointestinal complaints today.  Current Medications, Allergies, Past Medical History, Past Surgical History, Family History and Social History were reviewed in [employer] record.  Physical Exam: General: Well developed, well nourished, no acute distress Head: Normocephalic and atraumatic Eyes:  sclerae anicteric, EOMI Ears: Normal auditory acuity Mouth: No deformity or lesions Lungs: Clear throughout to auscultation Heart: Regular rate and rhythm; no murmurs, rubs or bruits Abdomen: Soft, non tender and non distended. No masses, hepatosplenomegaly or hernias noted. Normal Bowel sounds Musculoskeletal: Symmetrical with no gross deformities  Pulses:  Normal pulses noted Extremities: No clubbing, cyanosis, edema or deformities noted Neurological: Alert oriented x 4, grossly nonfocal Psychological:  Alert and cooperative. Normal mood and affect  Assessment and Recommendations:  1. GERD with a history of dysphagia. Dysphagia was felt secondary to cervical esophageal extrinsic compression however his symptoms improved following dilation in 2014. Continue omeprazole 20 mg daily and standard antireflux measures.  2. Personal history of adenomatous colon polyps. Five-year interval surveillance colonoscopy recommended in January 2019.

## 2015-06-08 ENCOUNTER — Encounter: Payer: Self-pay | Admitting: Family Medicine

## 2015-06-08 ENCOUNTER — Ambulatory Visit (INDEPENDENT_AMBULATORY_CARE_PROVIDER_SITE_OTHER): Payer: Medicare Other | Admitting: Family Medicine

## 2015-06-08 ENCOUNTER — Encounter (INDEPENDENT_AMBULATORY_CARE_PROVIDER_SITE_OTHER): Payer: Self-pay

## 2015-06-08 VITALS — BP 140/76 | HR 59 | Temp 96.9°F | Ht 69.0 in | Wt 176.2 lb

## 2015-06-08 DIAGNOSIS — I10 Essential (primary) hypertension: Secondary | ICD-10-CM | POA: Diagnosis not present

## 2015-06-08 DIAGNOSIS — N3281 Overactive bladder: Secondary | ICD-10-CM | POA: Insufficient documentation

## 2015-06-08 DIAGNOSIS — E785 Hyperlipidemia, unspecified: Secondary | ICD-10-CM | POA: Diagnosis not present

## 2015-06-08 MED ORDER — MIRABEGRON ER 50 MG PO TB24: 50.0000 mg | ORAL_TABLET | Freq: Every day | ORAL | Status: DC

## 2015-06-08 NOTE — Progress Notes (Signed)
BP 140/76 mmHg  Pulse 59  Temp(Src) 96.9 F (36.1 C) (Oral)  Ht 5\' 9"  (1.753 m)  Wt 176 lb 3.2 oz (79.924 kg)  BMI 26.01 kg/m2   Subjective:    Patient ID: [name], male    DOB: [birth year], 80 y.o.   MRN: [id number]  HPI: [name] is a 80 y.o. male presenting on 06/08/2015 for Establish Care   HPI Hypertension Patient is coming in stasis establish care with our office. He has previously had a physician outside of [facility] but all of his cardiology and specialist. In [facility] so he is transferring care to our office. His hypertension is currently under management by his cardiologist and he is on Ramaprill 10 mg. His blood pressure today is 140/76. We will monitor this and see what it is at the next visit.  Hyperlipidemia His cholesterol is also being currently managed by his cardiologist and he is on Saturday and Lipitor and fenofibrate. His cholesterol has been controlled with these. Patient denies headaches, blurred vision, chest pains, shortness of breath, or weakness. Denies any side effects from medication and is content with current medication.   Overactive bladder Patient has had overactive bladder symptoms of bladder leakage ever since he had a radical prostatectomy for prostate cancer many years ago. He has been taking Vesicare for quite some time for this and feels like over the past few years and is not working anymore. He has to get up about every 2 hours overnight and has to go multiple times throughout the day. He denies any dysuria or hematuria. He would like to consider trying something else if it might help more.  Relevant past medical, surgical, family and social history reviewed and updated as indicated. Interim medical history since our last visit reviewed. Allergies and medications reviewed and updated.  Review of Systems  Constitutional: Negative for fever.  HENT: Negative for ear discharge and ear pain.   Eyes: Negative for discharge and  visual disturbance.  Respiratory: Negative for shortness of breath and wheezing.   Cardiovascular: Negative for chest pain and leg swelling.  Gastrointestinal: Negative for abdominal pain, diarrhea and constipation.  Genitourinary: Positive for urgency and frequency. Negative for dysuria, hematuria, flank pain, decreased urine volume and difficulty urinating.  Musculoskeletal: Negative for back pain and gait problem.  Skin: Negative for rash.  Neurological: Negative for dizziness, syncope, light-headedness and headaches.  All other systems reviewed and are negative.   Per HPI unless specifically indicated above  Social History   Social History  . Marital Status: Married    Spouse Name: N/A  . Number of Children: N/A  . Years of Education: N/A   Occupational History  . Not on file.   Social History Main Topics  . Smoking status: Former Smoker -- 40 years    Types: Cigarettes    Quit date: 01/19/1997  . Smokeless tobacco: Never Used  . Alcohol Use: No  . Drug Use: No  . Sexual Activity: Not on file   Other Topics Concern  . Not on file   Social History Narrative    Past Surgical History  Procedure Laterality Date  . Transurethral resection of prostate    . Appendectomy    . Cataract extraction      both eyes  . Colonoscopy    . Carotid surgery    . Upper gi endoscopy  Nov. 3, 2014    Upper endoscopy with diatation    Family History  Problem Relation  Age of Onset  . Colon cancer Mother   . Cancer Mother     colon  . Heart disease Father     Heart Disease before age 90  . Heart attack Father 2  . Esophageal cancer Neg Hx   . Rectal cancer Neg Hx   . Stomach cancer Neg Hx       Medication List       This list is accurate as of: 06/08/15  9:30 AM.  Always use your most recent med list.               aspirin 81 MG tablet  Take 81 mg by mouth 2 (two) times daily.     atorvastatin 10 MG tablet  Commonly known as:  LIPITOR  Take 10 mg by mouth  daily.     cetirizine 10 MG tablet  Commonly known as:  ZYRTEC  Take 10 mg by mouth daily.     ezetimibe 10 MG tablet  Commonly known as:  ZETIA  Take 10 mg by mouth daily.     fenofibrate micronized 134 MG capsule  Commonly known as:  LOFIBRA     magnesium gluconate 500 MG tablet  Commonly known as:  MAGONATE  Take 500 mg by mouth 2 (two) times daily.     mirabegron ER 50 MG Tb24 tablet  Commonly known as:  MYRBETRIQ  Take 1 tablet (50 mg total) by mouth daily.     omeprazole 20 MG capsule  Commonly known as:  PRILOSEC  Take 1 capsule (20 mg total) by mouth daily.     ramipril 10 MG tablet  Commonly known as:  ALTACE  Take 10 mg by mouth daily.     solifenacin 10 MG tablet  Commonly known as:  VESICARE  Take 10 mg by mouth daily.     VITAMIN D PO  Take 50,000 Units by mouth once a week.           Objective:    BP 140/76 mmHg  Pulse 59  Temp(Src) 96.9 F (36.1 C) (Oral)  Ht 5\' 9"  (1.753 m)  Wt 176 lb 3.2 oz (79.924 kg)  BMI 26.01 kg/m2  Wt Readings from Last 3 Encounters:  06/08/15 176 lb 3.2 oz (79.924 kg)  04/20/15 175 lb 8 oz (79.606 kg)  02/20/15 175 lb (79.379 kg)    Physical Exam  Constitutional: He is oriented to person, place, and time. He appears well-developed and well-nourished. No distress.  HENT:  Right Ear: External ear normal.  Left Ear: External ear normal.  Nose: Nose normal.  Mouth/Throat: Oropharynx is clear and moist.  Eyes: Conjunctivae and EOM are normal. Pupils are equal, round, and reactive to light. Right eye exhibits no discharge. No scleral icterus.  Neck: Neck supple. No thyromegaly present.  Cardiovascular: Normal rate, regular rhythm, normal heart sounds and intact distal pulses.   No murmur heard. Pulmonary/Chest: Effort normal and breath sounds normal. No respiratory distress. He has no wheezes.  Abdominal: He exhibits no distension. There is no tenderness. There is no rebound.  Musculoskeletal: Normal range of motion.  He exhibits no edema.  Lymphadenopathy:    He has no cervical adenopathy.  Neurological: He is alert and oriented to person, place, and time. Coordination normal.  Skin: Skin is warm and dry. No rash noted. He is not diaphoretic.  Psychiatric: He has a normal mood and affect. His behavior is normal.  Vitals reviewed.     Assessment & Plan:  Problem List Items Addressed This Visit      Cardiovascular and Mediastinum   HTN (hypertension), benign     Genitourinary   Overactive bladder - Primary    Switch to myrbetriq from Vesicare      Relevant Medications   mirabegron ER (MYRBETRIQ) 50 MG TB24 tablet     Other   Hyperlipidemia LDL goal <100       Follow up plan: Return in about 3 months (around 09/08/2015), or if symptoms worsen or fail to improve, for f/u htn, and OAB.  Caryl Pina, MD [facility] 06/08/2015, 9:30 AM

## 2015-06-09 ENCOUNTER — Other Ambulatory Visit: Payer: Self-pay

## 2015-06-09 MED ORDER — EZETIMIBE 10 MG PO TABS: 10.0000 mg | ORAL_TABLET | Freq: Every day | ORAL | Status: DC

## 2015-06-09 MED ORDER — OMEPRAZOLE 20 MG PO CPDR: 20.0000 mg | DELAYED_RELEASE_CAPSULE | Freq: Every day | ORAL | Status: DC

## 2015-06-09 MED ORDER — FENOFIBRATE MICRONIZED 134 MG PO CAPS: 134.0000 mg | ORAL_CAPSULE | Freq: Every day | ORAL | Status: DC

## 2015-06-09 MED ORDER — ATORVASTATIN CALCIUM 10 MG PO TABS: 10.0000 mg | ORAL_TABLET | Freq: Every day | ORAL | Status: DC

## 2015-06-09 MED ORDER — CETIRIZINE HCL 10 MG PO TABS: 10.0000 mg | ORAL_TABLET | Freq: Every day | ORAL | Status: DC

## 2015-06-10 ENCOUNTER — Other Ambulatory Visit: Payer: Self-pay | Admitting: *Deleted

## 2015-06-10 MED ORDER — RAMIPRIL 10 MG PO CAPS: 10.0000 mg | ORAL_CAPSULE | Freq: Every day | ORAL | Status: DC

## 2015-06-10 NOTE — Telephone Encounter (Signed)
I don't see what medication is been asked to be refilled, there are no pending meds

## 2015-06-10 NOTE — Assessment & Plan Note (Signed)
Switch to myrbetriq from [employer]

## 2015-06-24 ENCOUNTER — Other Ambulatory Visit: Payer: Self-pay

## 2015-06-24 NOTE — Telephone Encounter (Signed)
Recommended to take 5000-6000 international units of vitamin D daily instead. He likely does not need the 50,000 units unless his severely low and labs. He will need labs before I would give that much. Please send new prescription for the 5000-6000

## 2015-06-24 NOTE — Telephone Encounter (Signed)
Received request to refill Vitamin D 50,000 to [facility]. No recent labs done. Please advise on refill

## 2015-07-06 ENCOUNTER — Telehealth: Payer: Self-pay | Admitting: Family Medicine

## 2015-07-07 ENCOUNTER — Telehealth: Payer: Self-pay

## 2015-07-07 NOTE — Telephone Encounter (Signed)
Patient states that he has some paper work regarding his medication and would like to talk to Dr. Warrick Parisian or is nurse.  Patient states that he will bring paper work today 07/07/15

## 2015-07-07 NOTE — Telephone Encounter (Signed)
Patient brought by paperwork he had printed off of [facility] website for renewal of Myrbetriq and Vitamin D.  He wanted me to contact [facility] and give them ok for refill of these medications.  I contacted [facility] and gave them refill orders for both.  Contacted patient and left them a voicemail that this had been taken care of.

## 2015-07-30 DIAGNOSIS — H00025 Hordeolum internum left lower eyelid: Secondary | ICD-10-CM | POA: Diagnosis not present

## 2015-08-18 ENCOUNTER — Telehealth: Payer: Self-pay | Admitting: Family Medicine

## 2015-08-19 NOTE — Telephone Encounter (Signed)
Okay sounds good. thanks ?

## 2015-08-19 NOTE — Telephone Encounter (Signed)
FYI,         Patient has had increase in shortness of breath over the past few months.  His ankles have also been swelling.   Dr. Stanford Breed, his cardiologist, has to be in hospital and then taking a week off afterwards from work so is not available.    The cardiology office has given the patient an appointment for June 6 with a PA.   Patient does not want to wait until then to get checked out.   He has scheduled to see Dr. Warrick Parisian on Thursday , June 1.

## 2015-08-20 ENCOUNTER — Ambulatory Visit: Payer: Self-pay | Admitting: Family Medicine

## 2015-08-21 ENCOUNTER — Ambulatory Visit: Payer: Medicare Other | Admitting: Family Medicine

## 2015-08-25 ENCOUNTER — Ambulatory Visit: Payer: Medicare Other | Admitting: Cardiology

## 2015-08-26 ENCOUNTER — Ambulatory Visit (INDEPENDENT_AMBULATORY_CARE_PROVIDER_SITE_OTHER): Payer: Medicare Other | Admitting: Family Medicine

## 2015-08-26 ENCOUNTER — Ambulatory Visit (INDEPENDENT_AMBULATORY_CARE_PROVIDER_SITE_OTHER): Payer: Medicare Other

## 2015-08-26 ENCOUNTER — Encounter: Payer: Self-pay | Admitting: Family Medicine

## 2015-08-26 VITALS — BP 129/75 | HR 70 | Temp 97.7°F | Ht 69.0 in | Wt 179.0 lb

## 2015-08-26 DIAGNOSIS — R0602 Shortness of breath: Secondary | ICD-10-CM | POA: Diagnosis not present

## 2015-08-26 DIAGNOSIS — R609 Edema, unspecified: Secondary | ICD-10-CM | POA: Diagnosis not present

## 2015-08-26 MED ORDER — FUROSEMIDE 20 MG PO TABS: 20.0000 mg | ORAL_TABLET | Freq: Every day | ORAL | Status: DC

## 2015-08-26 NOTE — Progress Notes (Signed)
 BP 129/75 mmHg  Pulse 70  Temp(Src) 97.7 F (36.5 C) (Oral)  Ht 5' 9" (1.753 m)  Wt 179 lb (81.194 kg)  BMI 26.42 kg/m2   Subjective:    Patient ID: [name], male    DOB: 12/11/1935, 80 y.o.   MRN: [id number]  HPI: [name] is a 80 y.o. male presenting on 08/26/2015 for Shortness of Breath; Edema in legs; and Cough   HPI Shortness of breath swelling and weight gain Patient is coming today for shortness of breath and swelling and weight gain. He has been having some swelling in his legs and some weight gain and increased shortness of breath is been going on for the past month and a half. Looking back at his record he has gained 4 pounds since January which was 5 months ago. He denies any fevers or chills. His shortness of breath has been worse on exertion. He denies any chest pain. He is swelling has been in both legs but is not a huge amount but it is noticeable. He had an echocardiogram in 2015 which was normal. He has not had any labs in our system that we have for quite some time.  Relevant past medical, surgical, family and social history reviewed and updated as indicated. Interim medical history since our last visit reviewed. Allergies and medications reviewed and updated.  Review of Systems  Constitutional: Positive for unexpected weight change. Negative for fever.  HENT: Negative for congestion, ear discharge, ear pain, postnasal drip, rhinorrhea and sinus pressure.   Eyes: Negative for discharge and visual disturbance.  Respiratory: Positive for cough and shortness of breath. Negative for chest tightness and wheezing.   Cardiovascular: Positive for leg swelling. Negative for chest pain and palpitations.  Gastrointestinal: Negative for abdominal pain, diarrhea and constipation.  Genitourinary: Negative for difficulty urinating.  Musculoskeletal: Negative for back pain and gait problem.  Skin: Negative for rash.  Neurological: Negative for syncope,  light-headedness and headaches.  All other systems reviewed and are negative.   Per HPI unless specifically indicated above     Medication List       This list is accurate as of: 08/26/15  4:55 PM.  Always use your most recent med list.               aspirin 81 MG tablet  Take 81 mg by mouth 2 (two) times daily.     atorvastatin 10 MG tablet  Commonly known as:  LIPITOR  Take 1 tablet (10 mg total) by mouth daily.     cetirizine 10 MG tablet  Commonly known as:  ZYRTEC  Take 1 tablet (10 mg total) by mouth daily.     ergocalciferol 50000 units capsule  Commonly known as:  VITAMIN D2  Take 50,000 Units by mouth once a week.     ezetimibe 10 MG tablet  Commonly known as:  ZETIA  Take 1 tablet (10 mg total) by mouth daily.     fenofibrate micronized 134 MG capsule  Commonly known as:  LOFIBRA  Take 1 capsule (134 mg total) by mouth daily before breakfast.     magnesium gluconate 500 MG tablet  Commonly known as:  MAGONATE  Take 500 mg by mouth 2 (two) times daily.     mirabegron ER 50 MG Tb24 tablet  Commonly known as:  MYRBETRIQ  Take 1 tablet (50 mg total) by mouth daily.     omeprazole 20 MG capsule  Commonly known as:  PRILOSEC    Take 1 capsule (20 mg total) by mouth daily.     ramipril 10 MG capsule  Commonly known as:  ALTACE  Take 1 capsule (10 mg total) by mouth daily.     VITAMIN D PO  Take 50,000 Units by mouth once a week.           Objective:    BP 129/75 mmHg  Pulse 70  Temp(Src) 97.7 F (36.5 C) (Oral)  Ht 5' 9" (1.753 m)  Wt 179 lb (81.194 kg)  BMI 26.42 kg/m2  Wt Readings from Last 3 Encounters:  08/26/15 179 lb (81.194 kg)  06/08/15 176 lb 3.2 oz (79.924 kg)  04/20/15 175 lb 8 oz (79.606 kg)    Physical Exam  Constitutional: He is oriented to person, place, and time. He appears well-developed and well-nourished. No distress.  HENT:  Right Ear: External ear normal.  Left Ear: External ear normal.  Nose: Nose normal.    Mouth/Throat: Oropharynx is clear and moist.  Eyes: Conjunctivae and EOM are normal. Pupils are equal, round, and reactive to light. Right eye exhibits no discharge. No scleral icterus.  Neck: Neck supple. No thyromegaly present.  Cardiovascular: Normal rate, regular rhythm, normal heart sounds and intact distal pulses.   No murmur heard. Pulmonary/Chest: Effort normal and breath sounds normal. No respiratory distress. He has no wheezes. He has no rales. He exhibits no tenderness.  Abdominal: He exhibits no distension. There is no tenderness. There is no rebound and no guarding.  Musculoskeletal: Normal range of motion. He exhibits edema (1+ edema in bilateral lower extremities).  Lymphadenopathy:    He has no cervical adenopathy.  Neurological: He is alert and oriented to person, place, and time. Coordination normal.  Skin: Skin is warm and dry. No rash noted. He is not diaphoretic.  Psychiatric: He has a normal mood and affect. His behavior is normal.  Nursing note and vitals reviewed.  Chest x-ray: Appears to have an abnormal barrier between the lobes of the lung in the right upper. Await final read by radiology. No signs of pneumonia or other acute infiltrates.    Assessment & Plan:   Problem List Items Addressed This Visit    None    Visit Diagnoses    Shortness of breath    -  Primary    Concern for possible CHF, will also do labs for kidneys and liver as well    Relevant Medications    furosemide (LASIX) 20 MG tablet    Other Relevant Orders    DG Chest 2 View    CMP14+EGFR    TSH    Brain natriuretic peptide    Peripheral edema        Relevant Medications    furosemide (LASIX) 20 MG tablet    Other Relevant Orders    TSH    Brain natriuretic peptide        Follow up plan: Return if symptoms worsen or fail to improve.  Counseling provided for all of the vaccine components Orders Placed This Encounter  Procedures  . DG Chest 2 View  . CMP14+EGFR  . TSH  .  Brain natriuretic peptide     , MD [facility] 08/26/2015, 4:55 PM      

## 2015-08-27 ENCOUNTER — Telehealth: Payer: Self-pay

## 2015-08-27 DIAGNOSIS — R0609 Other forms of dyspnea: Principal | ICD-10-CM

## 2015-08-27 LAB — CMP14+EGFR
ALK PHOS: 55 IU/L (ref 39–117)
ALT: 24 IU/L (ref 0–44)
AST: 28 IU/L (ref 0–40)
Albumin/Globulin Ratio: 1.9 (ref 1.2–2.2)
Albumin: 4.2 g/dL (ref 3.5–4.8)
BILIRUBIN TOTAL: 0.3 mg/dL (ref 0.0–1.2)
BUN/Creatinine Ratio: 16 (ref 10–24)
BUN: 17 mg/dL (ref 8–27)
CHLORIDE: 101 mmol/L (ref 96–106)
CO2: 24 mmol/L (ref 18–29)
Calcium: 9.4 mg/dL (ref 8.6–10.2)
Creatinine, Ser: 1.09 mg/dL (ref 0.76–1.27)
GFR calc Af Amer: 75 mL/min/{1.73_m2} (ref >59)
GFR calc non Af Amer: 65 mL/min/{1.73_m2} (ref >59)
GLUCOSE: 78 mg/dL (ref 65–99)
Globulin, Total: 2.2 g/dL (ref 1.5–4.5)
Potassium: 4.1 mmol/L (ref 3.5–5.2)
Sodium: 140 mmol/L (ref 134–144)
TOTAL PROTEIN: 6.4 g/dL (ref 6.0–8.5)

## 2015-08-27 LAB — BRAIN NATRIURETIC PEPTIDE: BNP: 71.6 pg/mL (ref 0.0–100.0)

## 2015-08-27 LAB — TSH: TSH: 3.18 u[IU]/mL (ref 0.450–4.500)

## 2015-08-27 NOTE — Telephone Encounter (Signed)
Yes I guess we got distracted by doing the chest x-ray because it was late, he can come back for a nurse visit and do an EKG and I will read it.

## 2015-08-27 NOTE — Telephone Encounter (Signed)
After getting home yesterday, patient started thinking today that you had mentioned the possibility of doing an EKG yesterday.   He would like to know if you recommend he come back and have the test done.

## 2015-08-28 ENCOUNTER — Other Ambulatory Visit: Payer: Self-pay

## 2015-08-28 DIAGNOSIS — R0602 Shortness of breath: Secondary | ICD-10-CM

## 2015-08-28 NOTE — Telephone Encounter (Signed)
Yes we can go ahead and do the echocardiogram as well but do the EKG still

## 2015-08-28 NOTE — Telephone Encounter (Signed)
Spoke with pt and advised he could schedule an appt with the nurse for the EKG and pt states he was under the impression that Dr.Dettinger had wanted him to have an echocardiogram. Please advise.

## 2015-08-28 NOTE — Telephone Encounter (Signed)
Lmtcb/ww 6/9

## 2015-08-28 NOTE — Telephone Encounter (Signed)
Spoke with pt and scheduled EKG and he is aware we did place order for Echo.

## 2015-09-01 ENCOUNTER — Ambulatory Visit (HOSPITAL_COMMUNITY)
Admission: RE | Admit: 2015-09-01 | Discharge: 2015-09-01 | Disposition: A | Discharge status: Home / Self Care | Payer: Medicare Other | Source: Ambulatory Visit | Attending: Family Medicine | Admitting: Family Medicine

## 2015-09-01 DIAGNOSIS — J439 Emphysema, unspecified: Secondary | ICD-10-CM | POA: Diagnosis not present

## 2015-09-01 DIAGNOSIS — M778 Other enthesopathies, not elsewhere classified: Secondary | ICD-10-CM | POA: Insufficient documentation

## 2015-09-01 DIAGNOSIS — Q2572 Congenital pulmonary arteriovenous malformation: Secondary | ICD-10-CM | POA: Diagnosis not present

## 2015-09-01 DIAGNOSIS — I2510 Atherosclerotic heart disease of native coronary artery without angina pectoris: Secondary | ICD-10-CM | POA: Insufficient documentation

## 2015-09-01 DIAGNOSIS — I517 Cardiomegaly: Secondary | ICD-10-CM | POA: Insufficient documentation

## 2015-09-01 DIAGNOSIS — I700 Atherosclerosis of aorta: Secondary | ICD-10-CM | POA: Insufficient documentation

## 2015-09-01 DIAGNOSIS — R0602 Shortness of breath: Secondary | ICD-10-CM | POA: Diagnosis not present

## 2015-09-01 MED ORDER — IOPAMIDOL (ISOVUE-300) INJECTION 61%
75.0000 mL | Freq: Once | INTRAVENOUS | Status: AC | PRN
  Administered 2015-09-01: 75 mL via INTRAVENOUS

## 2015-09-02 ENCOUNTER — Ambulatory Visit (INDEPENDENT_AMBULATORY_CARE_PROVIDER_SITE_OTHER): Payer: Medicare Other | Admitting: *Deleted

## 2015-09-02 DIAGNOSIS — R0609 Other forms of dyspnea: Secondary | ICD-10-CM

## 2015-09-02 NOTE — Progress Notes (Signed)
Pt came in today to have an EKG per Dr.Dettinger. EKG performed and Dr.Dettinger read EKG.

## 2015-09-09 ENCOUNTER — Ambulatory Visit (INDEPENDENT_AMBULATORY_CARE_PROVIDER_SITE_OTHER): Payer: Medicare Other

## 2015-09-09 ENCOUNTER — Other Ambulatory Visit: Payer: Self-pay

## 2015-09-09 DIAGNOSIS — R0609 Other forms of dyspnea: Secondary | ICD-10-CM | POA: Diagnosis not present

## 2015-09-09 LAB — ECHOCARDIOGRAM COMPLETE
CHL CUP DOP CALC LVOT VTI: 25.5 cm
E/e' ratio: 6.55
EWDT: 335 ms
FS: 39 % (ref 28–44)
IV/PV OW: 0.96
LA diam end sys: 34 mm
LADIAMINDEX: 1.7 cm/m2
LASIZE: 34 mm
LAVOL: 63.5 mL
LAVOLA4C: 59.6 mL
LAVOLIN: 31.7 mL/m2
LDCA: 3.8 cm2
LV E/e' medial: 6.55
LV E/e'average: 6.55
LV PW d: 9.88 mm — AB (ref 0.6–1.1)
LV TDI E'MEDIAL: 7.93
LV dias vol index: 29 mL/m2
LV e' LATERAL: 8.22 cm/s
LVDIAVOL: 57 mL — AB (ref 62–150)
LVOT SV: 97 mL
LVOT diameter: 22 mm
LVOT peak vel: 109 cm/s
LVSYSVOL: 17 mL — AB (ref 21–61)
LVSYSVOLIN: 8 mL/m2
MV Dec: 335
MV pk A vel: 69.4 m/s
MV pk E vel: 53.8 m/s
PV Reg vel dias: 80.5 cm/s
RV TAPSE: 27.2 mm
Simpson's disk: 71
Stroke v: 41 ml
TDI e' lateral: 8.22

## 2015-09-15 ENCOUNTER — Encounter: Payer: Self-pay | Admitting: Family Medicine

## 2015-09-15 ENCOUNTER — Ambulatory Visit (INDEPENDENT_AMBULATORY_CARE_PROVIDER_SITE_OTHER): Payer: Medicare Other | Admitting: Family Medicine

## 2015-09-15 VITALS — BP 118/62 | HR 64 | Temp 97.4°F | Ht 69.0 in | Wt 181.0 lb

## 2015-09-15 DIAGNOSIS — J439 Emphysema, unspecified: Secondary | ICD-10-CM | POA: Diagnosis not present

## 2015-09-15 DIAGNOSIS — I10 Essential (primary) hypertension: Secondary | ICD-10-CM

## 2015-09-15 DIAGNOSIS — E785 Hyperlipidemia, unspecified: Secondary | ICD-10-CM | POA: Diagnosis not present

## 2015-09-15 MED ORDER — ALBUTEROL SULFATE HFA 108 (90 BASE) MCG/ACT IN AERS: 2.0000 | INHALATION_SPRAY | Freq: Four times a day (QID) | RESPIRATORY_TRACT | Status: DC | PRN

## 2015-09-15 NOTE — Progress Notes (Signed)
BP 118/62 mmHg  Pulse 64  Temp(Src) 97.4 F (36.3 C) (Oral)  Ht 5\' 9"  (1.753 m)  Wt 181 lb (82.101 kg)  BMI 26.72 kg/m2  SpO2 98%   Subjective:    Patient ID: [name], male    DOB: [birth year], 80 y.o.   MRN: [id number]  HPI: [name] is a 80 y.o. male presenting on 09/15/2015 for Hypertension; Hyperlipidemia; and Gastroesophageal Reflux   HPI Follow up shortness of breath on exertion Patient is coming in today for a follow-up of shortness of breath on exertion. He had an echocardiogram in the past couple weeks and it was essentially normal with trivial regurg and an ejection fraction of 60-65%. He had no regional wall abnormalities. He still has the shortness of breath sometimes on exertion. The likelihood is they are associated to emphysema changes in his lungs. He has a history of smoking. He says he does not get short of breath at baseline but mainly when he goes distances or prolonged periods. He denies any significant cough and wheezing that he knows of. He denies any fevers or chills or shortness of breath or upper respiratory symptoms.  Hypertension recheck Patient is coming in today for hypertension recheck. His blood pressure today is 118/62. Patient is currently on ramipril. He denies any issues with medication. Patient denies headaches, blurred vision, chest pains, shortness of breath, or weakness. Denies any side effects from medication and is content with current medication.   Hyperlipidemia recheck Patient is currently on this idea and a fenofibrate and Lipitor for his cholesterol. He denies any issues with cramping or myalgias or problems with his liver that he knows of. His last lab values were normal for liver function. His last cholesterol lab values were normal as well less than a month ago.  Relevant past medical, surgical, family and social history reviewed and updated as indicated. Interim medical history since our last visit reviewed. Allergies and  medications reviewed and updated.  Review of Systems  Constitutional: Negative for fever.  HENT: Negative for ear discharge and ear pain.   Eyes: Negative for discharge and visual disturbance.  Respiratory: Positive for shortness of breath (On exertion). Negative for cough, chest tightness and wheezing.   Cardiovascular: Negative for chest pain and leg swelling.  Gastrointestinal: Negative for abdominal pain, diarrhea and constipation.  Genitourinary: Negative for difficulty urinating.  Musculoskeletal: Negative for back pain and gait problem.  Skin: Negative for rash.  Neurological: Negative for dizziness, syncope, light-headedness and headaches.  All other systems reviewed and are negative.   Per HPI unless specifically indicated above     Medication List       This list is accurate as of: 09/15/15  8:23 AM.  Always use your most recent med list.               albuterol 108 (90 Base) MCG/ACT inhaler  Commonly known as:  PROVENTIL HFA;VENTOLIN HFA  Inhale 2 puffs into the lungs every 6 (six) hours as needed for wheezing or shortness of breath.     aspirin 81 MG tablet  Take 81 mg by mouth 2 (two) times daily.     atorvastatin 10 MG tablet  Commonly known as:  LIPITOR  Take 1 tablet (10 mg total) by mouth daily.     cetirizine 10 MG tablet  Commonly known as:  ZYRTEC  Take 1 tablet (10 mg total) by mouth daily.     ergocalciferol 50000 units capsule  Commonly known as:  VITAMIN D2  Take 50,000 Units by mouth once a week.     ezetimibe 10 MG tablet  Commonly known as:  ZETIA  Take 1 tablet (10 mg total) by mouth daily.     fenofibrate micronized 134 MG capsule  Commonly known as:  LOFIBRA  Take 1 capsule (134 mg total) by mouth daily before breakfast.     furosemide 20 MG tablet  Commonly known as:  LASIX  Take 1 tablet (20 mg total) by mouth daily.     magnesium gluconate 500 MG tablet  Commonly known as:  MAGONATE  Take 500 mg by mouth 2 (two) times  daily.     mirabegron ER 50 MG Tb24 tablet  Commonly known as:  MYRBETRIQ  Take 1 tablet (50 mg total) by mouth daily.     omeprazole 20 MG capsule  Commonly known as:  PRILOSEC  Take 1 capsule (20 mg total) by mouth daily.     ramipril 10 MG capsule  Commonly known as:  ALTACE  Take 1 capsule (10 mg total) by mouth daily.           Objective:    BP 118/62 mmHg  Pulse 64  Temp(Src) 97.4 F (36.3 C) (Oral)  Ht 5\' 9"  (1.753 m)  Wt 181 lb (82.101 kg)  BMI 26.72 kg/m2  SpO2 98%  Wt Readings from Last 3 Encounters:  09/15/15 181 lb (82.101 kg)  08/26/15 179 lb (81.194 kg)  06/08/15 176 lb 3.2 oz (79.924 kg)    Physical Exam  Constitutional: He is oriented to person, place, and time. He appears well-developed and well-nourished. No distress.  Eyes: Conjunctivae and EOM are normal. Pupils are equal, round, and reactive to light. Right eye exhibits no discharge. No scleral icterus.  Neck: Neck supple. No thyromegaly present.  Cardiovascular: Normal rate, regular rhythm, normal heart sounds and intact distal pulses.   No murmur heard. Pulmonary/Chest: Effort normal and breath sounds normal. No respiratory distress. He has no wheezes. He has no rales. He exhibits no tenderness.  Musculoskeletal: Normal range of motion. He exhibits no edema.  Lymphadenopathy:    He has no cervical adenopathy.  Neurological: He is alert and oriented to person, place, and time. Coordination normal.  Skin: Skin is warm and dry. No rash noted. He is not diaphoretic.  Psychiatric: He has a normal mood and affect. His behavior is normal. Judgment and thought content normal.  Nursing note and vitals reviewed.   Results for orders placed or performed in visit on 09/09/15  Echocardiogram  Result Value Ref Range   LV PW d 9.88 (A) 0.6 - 1.1 mm   FS 39 28 - 44 %   LA vol 63.5 mL   LA ID, A-P, ES 34 mm   IVS/LV PW RATIO, ED .96    Stroke v 41 ml   LVOT VTI 25.5 cm   PV Reg vel dias 80.5 cm/s   LV  e' LATERAL 8.22 cm/s   LV E/e' medial 6.55    LV E/e'average 6.55    LA diam index 1.7 cm/m2   LA vol A4C 59.6 ml   E decel time 335 msec   LVOT diameter 22 mm   LVOT area 3.8 cm2   LVOT peak vel 109 cm/s   LVOT SV 97 mL   E/e' ratio 6.55    MV pk E vel 53.8 m/s   MV pk A vel 69.4 m/s   LV sys vol 17 (A) 21 -  61 mL   LV sys vol index 8 mL/m2   LV dias vol 57 (A) 62 - 150 mL   LV dias vol index 29 mL/m2   LA vol index 31.7 mL/m2   MV Dec 335    LA diam end sys 34 mm   Simpson's disk 71    TDI e' medial 7.93    TDI e' lateral 8.22    TAPSE 27.2 mm      Assessment & Plan:       Problem List Items Addressed This Visit      Cardiovascular and Mediastinum   HTN (hypertension), benign - Primary     Respiratory   COPD (chronic obstructive pulmonary disease) (HCC)    Mild, mostly on exertion, will use albuterol inhaler before exertion and we will reevaluate in 3 months      Relevant Medications   albuterol (PROVENTIL HFA;VENTOLIN HFA) 108 (90 Base) MCG/ACT inhaler     Other   Hyperlipidemia LDL goal <100       Follow up plan: Return in about 3 months (around 12/16/2015), or if symptoms worsen or fail to improve, for Recheck breathing and COPD.  Counseling provided for all of the vaccine components No orders of the defined types were placed in this encounter.    Caryl Pina, MD [facility] 09/15/2015, 8:23 AM

## 2015-09-15 NOTE — Assessment & Plan Note (Signed)
Mild, mostly on exertion, will use albuterol inhaler before exertion and we will reevaluate in 3 months

## 2015-10-26 ENCOUNTER — Telehealth: Payer: Self-pay | Admitting: Family Medicine

## 2015-11-03 NOTE — Telephone Encounter (Signed)
Gave info to [employer] after holding for 12 minutes

## 2015-11-05 ENCOUNTER — Telehealth: Payer: Self-pay | Admitting: Family Medicine

## 2015-11-05 DIAGNOSIS — J439 Emphysema, unspecified: Secondary | ICD-10-CM

## 2015-11-05 DIAGNOSIS — R609 Edema, unspecified: Secondary | ICD-10-CM

## 2015-11-05 DIAGNOSIS — R0602 Shortness of breath: Secondary | ICD-10-CM

## 2015-11-09 MED ORDER — FUROSEMIDE 20 MG PO TABS: 20.0000 mg | ORAL_TABLET | Freq: Every day | ORAL | 3 refills | Status: DC

## 2015-11-09 MED ORDER — ALBUTEROL SULFATE HFA 108 (90 BASE) MCG/ACT IN AERS: 2.0000 | INHALATION_SPRAY | Freq: Four times a day (QID) | RESPIRATORY_TRACT | 3 refills | Status: DC | PRN

## 2015-11-09 NOTE — Telephone Encounter (Signed)
Pt aware.

## 2015-11-10 ENCOUNTER — Other Ambulatory Visit: Payer: Self-pay

## 2015-11-10 DIAGNOSIS — J439 Emphysema, unspecified: Secondary | ICD-10-CM

## 2015-11-10 MED ORDER — ALBUTEROL SULFATE HFA 108 (90 BASE) MCG/ACT IN AERS: 2.0000 | INHALATION_SPRAY | Freq: Four times a day (QID) | RESPIRATORY_TRACT | 3 refills | Status: DC | PRN

## 2015-11-19 ENCOUNTER — Other Ambulatory Visit: Payer: Self-pay | Admitting: Family Medicine

## 2015-12-17 ENCOUNTER — Encounter: Payer: Self-pay | Admitting: Family Medicine

## 2015-12-17 ENCOUNTER — Ambulatory Visit (INDEPENDENT_AMBULATORY_CARE_PROVIDER_SITE_OTHER): Payer: Medicare Other | Admitting: Family Medicine

## 2015-12-17 VITALS — BP 134/59 | HR 60 | Temp 97.1°F | Ht 69.0 in | Wt 181.0 lb

## 2015-12-17 DIAGNOSIS — Z23 Encounter for immunization: Secondary | ICD-10-CM | POA: Diagnosis not present

## 2015-12-17 DIAGNOSIS — J439 Emphysema, unspecified: Secondary | ICD-10-CM | POA: Diagnosis not present

## 2015-12-17 DIAGNOSIS — M791 Myalgia, unspecified site: Secondary | ICD-10-CM

## 2015-12-17 DIAGNOSIS — I10 Essential (primary) hypertension: Secondary | ICD-10-CM

## 2015-12-17 DIAGNOSIS — E785 Hyperlipidemia, unspecified: Secondary | ICD-10-CM

## 2015-12-17 NOTE — Assessment & Plan Note (Signed)
Controlled, continue current medications.

## 2015-12-17 NOTE — Progress Notes (Signed)
BP (!) 134/59   Pulse 60   Temp 97.1 F (36.2 C) (Oral)   Ht '5\' 9"'$  (1.753 m)   Wt 181 lb (82.1 kg)   BMI 26.73 kg/m    Subjective:    Patient ID: [name], male    DOB: [birth year], 80 y.o.   MRN: [id number]  HPI: [name] is a 80 y.o. male presenting on 12/17/2015 for Hyperlipidemia (followup, patient is fasting); Hypertension; and COPD   HPI Hypertension recheck Patient is coming in today for a blood pressure recheck. His blood pressure is 134/59. Patient is currently on ramipril. Patient denies headaches, blurred vision, chest pains, shortness of breath, or weakness. Denies any side effects from medication and is content with current medication.   Hyperlipidemia Patient is coated today for a cholesterol recheck. He is currently on Zetia and fenofibrate and atorvastatin. He says he has been getting a lot of muscle aches and soreness and does not know if this is due to the atorvastatin or something else. He says it is a lot worse at night and he has to get up and walk for 20-30 minutes to get rid of it. He says it does happen occasionally during the day as well.  COPD recheck Patient is coming in for a COPD recheck as well. He does have the albuterol inhaler but has only had to use that once in the past 3 months. He denies any nighttime coughing episodes or wheezing episodes.  Relevant past medical, surgical, family and social history reviewed and updated as indicated. Interim medical history since our last visit reviewed. Allergies and medications reviewed and updated.  Review of Systems  Constitutional: Negative for chills and fever.  HENT: Negative for congestion and sore throat.   Eyes: Negative for discharge.  Respiratory: Negative for cough, chest tightness, shortness of breath and wheezing.   Cardiovascular: Negative for chest pain and leg swelling.  Musculoskeletal: Negative for back pain and gait problem.  Skin: Negative for rash.  Neurological: Negative  for dizziness.  All other systems reviewed and are negative.   Per HPI unless specifically indicated above     Objective:    BP (!) 134/59   Pulse 60   Temp 97.1 F (36.2 C) (Oral)   Ht '5\' 9"'$  (1.753 m)   Wt 181 lb (82.1 kg)   BMI 26.73 kg/m   Wt Readings from Last 3 Encounters:  12/17/15 181 lb (82.1 kg)  09/15/15 181 lb (82.1 kg)  08/26/15 179 lb (81.2 kg)    Physical Exam  Constitutional: He is oriented to person, place, and time. He appears well-developed and well-nourished. No distress.  Eyes: Conjunctivae are normal. Right eye exhibits no discharge. No scleral icterus.  Neck: Neck supple. No thyromegaly present.  Cardiovascular: Normal rate, regular rhythm, normal heart sounds and intact distal pulses.   No murmur heard. Pulmonary/Chest: Effort normal and breath sounds normal. No respiratory distress. He has no wheezes.  Musculoskeletal: Normal range of motion. He exhibits no edema.  Lymphadenopathy:    He has no cervical adenopathy.  Neurological: He is alert and oriented to person, place, and time. Coordination normal.  Skin: Skin is warm and dry. No rash noted. He is not diaphoretic.  Psychiatric: He has a normal mood and affect. His behavior is normal.  Nursing note and vitals reviewed.     Assessment & Plan:   Problem List Items Addressed This Visit      Cardiovascular and Mediastinum   HTN (hypertension), benign -  Primary    Controlled, continue current medications      Relevant Orders   CMP14+EGFR     Respiratory   COPD (chronic obstructive pulmonary disease) ([place])     Other   Hyperlipidemia LDL goal <100    Patient has been getting increased myalgias, worse at night than during the day, often has to get up and walk around to get achiness to go away and he wonders if that could be due to the statin. He discussed doing red rice yeast extract. We'll check his levels and see where we're at.      Relevant Orders   Lipid panel    Other Visit  Diagnoses    Myalgia       Relevant Orders   CK total and CKMB (cardiac)not at [facility]   Encounter for immunization       Relevant Orders   Flu Vaccine QUAD 36+ mos IM (Completed)       Follow up plan: Return in about 3 months (around 03/17/2016), or if symptoms worsen or fail to improve, for recheck htn and dm.  Counseling provided for all of the vaccine components Orders Placed This Encounter  Procedures  . CMP14+EGFR  . Lipid panel  . CK total and CKMB (cardiac)not at [facility], MD [name] 12/17/2015, 8:23 AM

## 2015-12-17 NOTE — Assessment & Plan Note (Signed)
Patient has been getting increased myalgias, worse at night than during the day, often has to get up and walk around to get achiness to go away and he wonders if that could be due to the statin. He discussed doing red rice yeast extract. We'll check his levels and see where we're at.

## 2015-12-18 LAB — CMP14+EGFR
ALBUMIN: 4.1 g/dL (ref 3.5–4.8)
ALK PHOS: 57 IU/L (ref 39–117)
ALT: 19 IU/L (ref 0–44)
AST: 22 IU/L (ref 0–40)
Albumin/Globulin Ratio: 1.9 (ref 1.2–2.2)
BILIRUBIN TOTAL: 0.2 mg/dL (ref 0.0–1.2)
BUN/Creatinine Ratio: 13 (ref 10–24)
BUN: 15 mg/dL (ref 8–27)
CHLORIDE: 103 mmol/L (ref 96–106)
CO2: 24 mmol/L (ref 18–29)
CREATININE: 1.17 mg/dL (ref 0.76–1.27)
Calcium: 9.3 mg/dL (ref 8.6–10.2)
GFR calc Af Amer: 69 mL/min/{1.73_m2} (ref >59)
GFR calc non Af Amer: 60 mL/min/{1.73_m2} (ref >59)
GLUCOSE: 113 mg/dL — AB (ref 65–99)
Globulin, Total: 2.2 g/dL (ref 1.5–4.5)
Potassium: 4.6 mmol/L (ref 3.5–5.2)
Sodium: 138 mmol/L (ref 134–144)
TOTAL PROTEIN: 6.3 g/dL (ref 6.0–8.5)

## 2015-12-18 LAB — LIPID PANEL
Chol/HDL Ratio: 3.4 ratio units (ref 0.0–5.0)
Cholesterol, Total: 140 mg/dL (ref 100–199)
HDL: 41 mg/dL (ref >39)
LDL Calculated: 82 mg/dL (ref 0–99)
Triglycerides: 86 mg/dL (ref 0–149)
VLDL Cholesterol Cal: 17 mg/dL (ref 5–40)

## 2015-12-18 LAB — CK TOTAL AND CKMB (NOT AT ARMC)
CK-MB Index: 3.7 ng/mL (ref 0.0–10.4)
Total CK: 456 U/L — ABNORMAL HIGH (ref 24–204)

## 2015-12-31 DIAGNOSIS — C44212 Basal cell carcinoma of skin of right ear and external auricular canal: Secondary | ICD-10-CM | POA: Diagnosis not present

## 2015-12-31 DIAGNOSIS — D225 Melanocytic nevi of trunk: Secondary | ICD-10-CM | POA: Diagnosis not present

## 2015-12-31 DIAGNOSIS — L820 Inflamed seborrheic keratosis: Secondary | ICD-10-CM | POA: Diagnosis not present

## 2015-12-31 DIAGNOSIS — L821 Other seborrheic keratosis: Secondary | ICD-10-CM | POA: Diagnosis not present

## 2016-01-08 ENCOUNTER — Other Ambulatory Visit: Payer: Self-pay | Admitting: Family Medicine

## 2016-03-01 ENCOUNTER — Ambulatory Visit: Payer: Medicare Other | Admitting: Family

## 2016-03-01 ENCOUNTER — Encounter (HOSPITAL_COMMUNITY): Payer: Medicare Other

## 2016-03-03 ENCOUNTER — Encounter: Payer: Self-pay | Admitting: Family

## 2016-03-10 ENCOUNTER — Other Ambulatory Visit: Payer: Self-pay | Admitting: *Deleted

## 2016-03-10 DIAGNOSIS — Z48812 Encounter for surgical aftercare following surgery on the circulatory system: Secondary | ICD-10-CM

## 2016-03-10 DIAGNOSIS — L821 Other seborrheic keratosis: Secondary | ICD-10-CM | POA: Diagnosis not present

## 2016-03-10 DIAGNOSIS — I6523 Occlusion and stenosis of bilateral carotid arteries: Secondary | ICD-10-CM

## 2016-03-10 DIAGNOSIS — Z85828 Personal history of other malignant neoplasm of skin: Secondary | ICD-10-CM | POA: Diagnosis not present

## 2016-03-10 DIAGNOSIS — Z08 Encounter for follow-up examination after completed treatment for malignant neoplasm: Secondary | ICD-10-CM | POA: Diagnosis not present

## 2016-03-10 DIAGNOSIS — X32XXXD Exposure to sunlight, subsequent encounter: Secondary | ICD-10-CM | POA: Diagnosis not present

## 2016-03-10 DIAGNOSIS — L570 Actinic keratosis: Secondary | ICD-10-CM | POA: Diagnosis not present

## 2016-03-11 ENCOUNTER — Ambulatory Visit (INDEPENDENT_AMBULATORY_CARE_PROVIDER_SITE_OTHER): Payer: Medicare Other | Admitting: Family

## 2016-03-11 ENCOUNTER — Ambulatory Visit (HOSPITAL_COMMUNITY)
Admission: RE | Admit: 2016-03-11 | Discharge: 2016-03-11 | Disposition: A | Discharge status: Home / Self Care | Payer: Medicare Other | Source: Ambulatory Visit | Attending: Family | Admitting: Family

## 2016-03-11 ENCOUNTER — Other Ambulatory Visit: Payer: Self-pay

## 2016-03-11 ENCOUNTER — Encounter: Payer: Self-pay | Admitting: Family

## 2016-03-11 VITALS — BP 141/75 | HR 61 | Temp 97.0°F | Resp 14 | Ht 69.0 in | Wt 182.2 lb

## 2016-03-11 DIAGNOSIS — I6523 Occlusion and stenosis of bilateral carotid arteries: Secondary | ICD-10-CM

## 2016-03-11 DIAGNOSIS — Z9889 Other specified postprocedural states: Secondary | ICD-10-CM | POA: Diagnosis not present

## 2016-03-11 DIAGNOSIS — I6529 Occlusion and stenosis of unspecified carotid artery: Secondary | ICD-10-CM

## 2016-03-11 DIAGNOSIS — I6521 Occlusion and stenosis of right carotid artery: Secondary | ICD-10-CM

## 2016-03-11 DIAGNOSIS — Z48812 Encounter for surgical aftercare following surgery on the circulatory system: Secondary | ICD-10-CM | POA: Insufficient documentation

## 2016-03-11 DIAGNOSIS — Z87891 Personal history of nicotine dependence: Secondary | ICD-10-CM

## 2016-03-11 NOTE — Progress Notes (Signed)
Chief Complaint: Follow up Extracranial Carotid Artery Stenosis   History of Present Illness  [name] is a 80 y.o. male patient of Dr. Kellie Simmering who is status post right CEA in 2007. His carotid stenosis was discovered by his PCP when he heard a bruit.  He has no history of TIA or stroke symptoms.Specifically the patient denies a history of amaurosis fugax or monocular blindness, unilateral facial drooping, hemiplegia, or receptive or expressive aphasia.  He denies claudication symptoms with walking, denies non-healing wounds. He works part time and stays physically active.  He saw a cardiologist for PVC's, stopped drinking caffeine and he has had almost no PVC's since then.  Pt Diabetic: No Pt smoker: former smoker, quit in 1998, smoked for 40 years  Pt meds include: Statin : no, had to stop the statin due to myalgias, took statins for 20 years, started Red Yeast Rice ASA: Yes Other anticoagulants/antiplatelets: no   Past Medical History:  Diagnosis Date  . Adenomatous colon polyp 11/1999  . Blood transfusion without reported diagnosis   . Cancer [facility])    Prostate  . Carotid artery occlusion   . Cataract   . Hyperlipidemia   . Hypertension   . Internal hemorrhoids     Social History Social History  Substance Use Topics  . Smoking status: Former Smoker    Years: 40.00    Types: Cigarettes    Quit date: 01/19/1997  . Smokeless tobacco: Never Used  . Alcohol use No    Family History Family History  Problem Relation Age of Onset  . Colon cancer Mother   . Cancer Mother     colon  . Heart disease Father     Heart Disease before age 69  . Heart attack Father 50  . Esophageal cancer Neg Hx   . Rectal cancer Neg Hx   . Stomach cancer Neg Hx     Surgical History Past Surgical History:  Procedure Laterality Date  . APPENDECTOMY    . carotid surgery    . CATARACT EXTRACTION     both eyes  . COLONOSCOPY    . TRANSURETHRAL RESECTION OF PROSTATE     . UPPER GI ENDOSCOPY  Nov. 3, 2014   Upper endoscopy with diatation    No Known Allergies  Current Outpatient Prescriptions  Medication Sig Dispense Refill  . albuterol (PROVENTIL HFA;VENTOLIN HFA) 108 (90 Base) MCG/ACT inhaler Inhale 2 puffs into the lungs every 6 (six) hours as needed for wheezing or shortness of breath. 3 Inhaler 3  . aspirin 81 MG tablet Take 81 mg by mouth 2 (two) times daily.     Marland Kitchen atorvastatin (LIPITOR) 10 MG tablet TAKE 1 TABLET DAILY 90 tablet 1  . cetirizine (ZYRTEC) 10 MG tablet Take 1 tablet (10 mg total) by mouth daily. 90 tablet 3  . ergocalciferol (VITAMIN D2) 50000 units capsule Take 50,000 Units by mouth once a week.    . ezetimibe (ZETIA) 10 MG tablet Take 1 tablet (10 mg total) by mouth daily. 90 tablet 1  . fenofibrate micronized (LOFIBRA) 134 MG capsule Take 1 capsule (134 mg total) by mouth daily before breakfast. 90 capsule 1  . furosemide (LASIX) 20 MG tablet Take 1 tablet (20 mg total) by mouth daily. 90 tablet 3  . magnesium gluconate (MAGONATE) 500 MG tablet Take 500 mg by mouth 2 (two) times daily.    . mirabegron ER (MYRBETRIQ) 50 MG TB24 tablet Take 1 tablet (50 mg total) by mouth daily.  30 tablet 1  . omeprazole (PRILOSEC) 20 MG capsule Take 1 capsule (20 mg total) by mouth daily. 90 capsule 3  . ramipril (ALTACE) 10 MG capsule TAKE 1 CAPSULE DAILY 90 capsule 1   No current facility-administered medications for this visit.     Review of Systems : See HPI for pertinent positives and negatives.  Physical Examination  Vitals:   03/11/16 0849  BP: (!) 141/75  Pulse: 61  Resp: 14  Temp: 97 F (36.1 C)  TempSrc: Oral  SpO2: 97%  Weight: 182 lb 4 oz (82.7 kg)   Body mass index is 26.91 kg/m.  General: WDWN male in NAD GAIT: normal Eyes: Pupils equal and reactive to light. Pulmonary: Respirations are non labored, CTAB, no rales,  rhonchi, or wheezing.  Cardiac: regular rhythm,no detected murmur  VASCULAR EXAM Carotid  Bruits Left Right   positive Negative    Radial pulses are 2+ palpable and equal.      LE Pulses LEFT RIGHT   POPLITEAL  Not palpable  not palpable       PT palpable palpable       DP Not palpable Not palpable    Gastrointestinal: soft, nontender, BS WNL, no r/g,no palpable masses.  Musculoskeletal: No muscle atrophy/wasting. M/S 5/5 throughout, extremities without ischemic changes.  Neurologic: A&O X 3; Appropriate Affect, normal sensation, Speech is normal CN 2-12 intact , Pain and light touch intact in extremities, Motor exam as listed above.      Assessment: [name] is a 80 y.o. male  who is status post right CEA in 2007. He has no history of stroke or TIA.   DATA Today's carotid duplex suggests a patent right ICA  with history of carotid endarterectomy, mild hyperplasia present at the mid/distal patch without hemodynamically significant changes. Left internal carotid artery stenosis present of less than 40%.  Bilateral vertebral artery flow is antegrade.  Bilateral subclavian artery waveforms are normal.  No significant change since the last exam on 02-28-15.  Plan: Follow-up in 18 months with Carotid Duplex scan.   I discussed in depth with the patient the nature of atherosclerosis, and emphasized the importance of maximal medical management including strict control of blood pressure, blood glucose, and lipid levels, obtaining regular exercise, and continued cessation of smoking.  The patient is aware that without maximal medical management the underlying atherosclerotic disease process will progress, limiting the benefit of any interventions. The patient was given information about stroke prevention and what symptoms should prompt the patient to seek immediate medical care. Thank you for  allowing [place] to participate in this patient's care.  Clemon Chambers, RN, MSN, FNP-C [facility] Office: [phone]  Clinic Physician: Scot Dock on call  03/11/16 8:55 AM

## 2016-03-11 NOTE — Patient Instructions (Signed)
Stroke Prevention Some medical conditions and behaviors are associated with an increased chance of having a stroke. You may prevent a stroke by making healthy choices and managing medical conditions. How can I reduce my risk of having a stroke?  Stay physically active. Get at least 30 minutes of activity on most or all days.  Do not smoke. It may also be helpful to avoid exposure to secondhand smoke.  Limit alcohol use. Moderate alcohol use is considered to be:  No more than 2 drinks per day for men.  No more than 1 drink per day for nonpregnant women.  Eat healthy foods. This involves:  Eating 5 or more servings of fruits and vegetables a day.  Making dietary changes that address high blood pressure (hypertension), high cholesterol, diabetes, or obesity.  Manage your cholesterol levels.  Making food choices that are high in fiber and low in saturated fat, trans fat, and cholesterol may control cholesterol levels.  Take any prescribed medicines to control cholesterol as directed by your health care provider.  Manage your diabetes.  Controlling your carbohydrate and sugar intake is recommended to manage diabetes.  Take any prescribed medicines to control diabetes as directed by your health care provider.  Control your hypertension.  Making food choices that are low in salt (sodium), saturated fat, trans fat, and cholesterol is recommended to manage hypertension.  Ask your health care provider if you need treatment to lower your blood pressure. Take any prescribed medicines to control hypertension as directed by your health care provider.  If you are 18-39 years of age, have your blood pressure checked every 3-5 years. If you are 40 years of age or older, have your blood pressure checked every year.  Maintain a healthy weight.  Reducing calorie intake and making food choices that are low in sodium, saturated fat, trans fat, and cholesterol are recommended to manage  weight.  Stop drug abuse.  Avoid taking birth control pills.  Talk to your health care provider about the risks of taking birth control pills if you are over 35 years old, smoke, get migraines, or have ever had a blood clot.  Get evaluated for sleep disorders (sleep apnea).  Talk to your health care provider about getting a sleep evaluation if you snore a lot or have excessive sleepiness.  Take medicines only as directed by your health care provider.  For some people, aspirin or blood thinners (anticoagulants) are helpful in reducing the risk of forming abnormal blood clots that can lead to stroke. If you have the irregular heart rhythm of atrial fibrillation, you should be on a blood thinner unless there is a good reason you cannot take them.  Understand all your medicine instructions.  Make sure that other conditions (such as anemia or atherosclerosis) are addressed. Get help right away if:  You have sudden weakness or numbness of the face, arm, or leg, especially on one side of the body.  Your face or eyelid droops to one side.  You have sudden confusion.  You have trouble speaking (aphasia) or understanding.  You have sudden trouble seeing in one or both eyes.  You have sudden trouble walking.  You have dizziness.  You have a loss of balance or coordination.  You have a sudden, severe headache with no known cause.  You have new chest pain or an irregular heartbeat. Any of these symptoms may represent a serious problem that is an emergency. Do not wait to see if the symptoms will go away.   Get medical help at once. Call your local emergency services (911 in [place] Do not drive yourself to the hospital. This information is not intended to replace advice given to you by your health care provider. Make sure you discuss any questions you have with your health care provider. Document Released: 04/14/2004 Document Revised: 08/13/2015 Document Reviewed: 09/07/2012 Elsevier  Interactive Patient Education  2017 Elsevier Inc.  

## 2016-03-18 ENCOUNTER — Ambulatory Visit (INDEPENDENT_AMBULATORY_CARE_PROVIDER_SITE_OTHER): Payer: Medicare Other | Admitting: Family Medicine

## 2016-03-18 ENCOUNTER — Encounter: Payer: Self-pay | Admitting: Family Medicine

## 2016-03-18 VITALS — BP 132/76 | HR 61 | Temp 97.1°F | Ht 69.0 in | Wt 182.1 lb

## 2016-03-18 DIAGNOSIS — I6523 Occlusion and stenosis of bilateral carotid arteries: Secondary | ICD-10-CM | POA: Diagnosis not present

## 2016-03-18 DIAGNOSIS — J439 Emphysema, unspecified: Secondary | ICD-10-CM | POA: Diagnosis not present

## 2016-03-18 DIAGNOSIS — R7301 Impaired fasting glucose: Secondary | ICD-10-CM

## 2016-03-18 DIAGNOSIS — I10 Essential (primary) hypertension: Secondary | ICD-10-CM | POA: Diagnosis not present

## 2016-03-18 DIAGNOSIS — E785 Hyperlipidemia, unspecified: Secondary | ICD-10-CM | POA: Diagnosis not present

## 2016-03-18 DIAGNOSIS — R739 Hyperglycemia, unspecified: Secondary | ICD-10-CM

## 2016-03-18 LAB — CMP14+EGFR
ALBUMIN: 4.1 g/dL (ref 3.5–4.8)
ALT: 33 IU/L (ref 0–44)
AST: 33 IU/L (ref 0–40)
Albumin/Globulin Ratio: 1.8 (ref 1.2–2.2)
Alkaline Phosphatase: 57 IU/L (ref 39–117)
BUN / CREAT RATIO: 14 (ref 10–24)
BUN: 17 mg/dL (ref 8–27)
Bilirubin Total: 0.2 mg/dL (ref 0.0–1.2)
CALCIUM: 9.5 mg/dL (ref 8.6–10.2)
CO2: 22 mmol/L (ref 18–29)
CREATININE: 1.25 mg/dL (ref 0.76–1.27)
Chloride: 103 mmol/L (ref 96–106)
GFR calc Af Amer: 63 mL/min/{1.73_m2} (ref >59)
GFR, EST NON AFRICAN AMERICAN: 55 mL/min/{1.73_m2} — AB (ref >59)
GLOBULIN, TOTAL: 2.3 g/dL (ref 1.5–4.5)
Glucose: 107 mg/dL — ABNORMAL HIGH (ref 65–99)
Potassium: 4.4 mmol/L (ref 3.5–5.2)
SODIUM: 141 mmol/L (ref 134–144)
Total Protein: 6.4 g/dL (ref 6.0–8.5)

## 2016-03-18 LAB — LIPID PANEL
CHOL/HDL RATIO: 5.2 ratio — AB (ref 0.0–5.0)
Cholesterol, Total: 187 mg/dL (ref 100–199)
HDL: 36 mg/dL — ABNORMAL LOW (ref >39)
LDL CALC: 129 mg/dL — AB (ref 0–99)
TRIGLYCERIDES: 112 mg/dL (ref 0–149)
VLDL Cholesterol Cal: 22 mg/dL (ref 5–40)

## 2016-03-18 MED ORDER — FENOFIBRATE MICRONIZED 134 MG PO CAPS: 134.0000 mg | ORAL_CAPSULE | Freq: Every day | ORAL | 3 refills | Status: DC

## 2016-03-18 MED ORDER — FENOFIBRATE MICRONIZED 134 MG PO CAPS: 134.0000 mg | ORAL_CAPSULE | Freq: Every day | ORAL | 0 refills | Status: DC

## 2016-03-18 NOTE — Progress Notes (Signed)
 BP 132/76   Pulse 61   Temp 97.1 F (36.2 C) (Oral)   Ht 5' 9" (1.753 m)   Wt 182 lb 2 oz (82.6 kg)   BMI 26.90 kg/m    Subjective:    Patient ID: [name], male    DOB: 07/11/1935, 80 y.o.   MRN: [id number]  HPI: [name] is a 80 y.o. male presenting on 03/18/2016 for Hyperlipidemia (3 month followup, patient is fasting) and Hypertension   HPI Hypertension Patient is coming in for hypertension recheck today. His blood pressure today is 132/76. Patient denies headaches, blurred vision, chest pains, shortness of breath, or weakness. Denies any side effects from medication and is content with current medication. Patient is currently on Ramipril. Patient also had an elevated glucose on his last check and we will check that with labs today.  Hyperlipidemia Patient is coming in for cholesterol recheck. He was having some issues with the atorvastatin because of myalgias and stopped it and has started red rice yeast extract and wants to check his cholesterol and see where it sat today. He says the myalgias and aches that he was having his legs have discontinued very soon after stopping the Lipitor.  COPD recheck Patient has an albuterol inhaler for mild COPD and has only had to use it twice in the past month. He denies any shortness of breath or wheezing or coughing spells. He does say that the cold does affect him some but he just avoids being out too much of a cold.  Relevant past medical, surgical, family and social history reviewed and updated as indicated. Interim medical history since our last visit reviewed. Allergies and medications reviewed and updated.  Review of Systems  Constitutional: Negative for chills and fever.  Respiratory: Negative for shortness of breath and wheezing.   Cardiovascular: Negative for chest pain and leg swelling.  Musculoskeletal: Negative for back pain and gait problem.  Skin: Negative for rash.  Neurological: Negative for dizziness,  facial asymmetry, light-headedness and headaches.  All other systems reviewed and are negative.   Per HPI unless specifically indicated above     Objective:    BP 132/76   Pulse 61   Temp 97.1 F (36.2 C) (Oral)   Ht 5' 9" (1.753 m)   Wt 182 lb 2 oz (82.6 kg)   BMI 26.90 kg/m   Wt Readings from Last 3 Encounters:  03/18/16 182 lb 2 oz (82.6 kg)  03/11/16 182 lb 4 oz (82.7 kg)  12/17/15 181 lb (82.1 kg)    Physical Exam  Constitutional: He is oriented to person, place, and time. He appears well-developed and well-nourished. No distress.  Eyes: Conjunctivae are normal. Right eye exhibits no discharge. Left eye exhibits no discharge. No scleral icterus.  Cardiovascular: Normal rate, regular rhythm, normal heart sounds and intact distal pulses.   No murmur heard. Pulmonary/Chest: Effort normal and breath sounds normal. No respiratory distress. He has no wheezes. He has no rales.  Musculoskeletal: Normal range of motion. He exhibits no edema.  Neurological: He is alert and oriented to person, place, and time. Coordination normal.  Skin: Skin is warm and dry. No rash noted. He is not diaphoretic.  Psychiatric: He has a normal mood and affect. His behavior is normal.  Nursing note and vitals reviewed.     Assessment & Plan:   Problem List Items Addressed This Visit      Cardiovascular and Mediastinum   HTN (hypertension), benign - Primary     Relevant Medications   fenofibrate micronized (LOFIBRA) 134 MG capsule   Other Relevant Orders   CMP14+EGFR     Respiratory   COPD (chronic obstructive pulmonary disease) ([facility])     Other   Hyperlipidemia LDL goal <100   Relevant Medications   fenofibrate micronized (LOFIBRA) 134 MG capsule   Other Relevant Orders   Lipid panel    Other Visit Diagnoses    Elevated fasting glucose       Relevant Orders   CMP14+EGFR       Follow up plan: Return in about 6 months (around 09/16/2016), or if symptoms worsen or fail to improve, for  Follow-up hypertension and cholesterol.  Counseling provided for all of the vaccine components Orders Placed This Encounter  Procedures  . CMP14+EGFR  . Lipid panel     , MD [facility] 03/18/2016, 8:25 AM     

## 2016-03-23 ENCOUNTER — Other Ambulatory Visit: Payer: Self-pay

## 2016-03-23 DIAGNOSIS — R739 Hyperglycemia, unspecified: Secondary | ICD-10-CM

## 2016-03-23 LAB — BAYER DCA HB A1C WAIVED: HB A1C (BAYER DCA - WAIVED): 5.8 % (ref <7.0)

## 2016-03-23 NOTE — Addendum Note (Signed)
Addended by: Liliane Bade on: 03/23/2016 12:41 PM   Modules accepted: Orders

## 2016-04-20 ENCOUNTER — Encounter: Payer: Self-pay | Admitting: Family Medicine

## 2016-04-20 ENCOUNTER — Ambulatory Visit (INDEPENDENT_AMBULATORY_CARE_PROVIDER_SITE_OTHER): Payer: Medicare Other

## 2016-04-20 ENCOUNTER — Ambulatory Visit (INDEPENDENT_AMBULATORY_CARE_PROVIDER_SITE_OTHER): Payer: Medicare Other | Admitting: Family Medicine

## 2016-04-20 VITALS — BP 135/72 | HR 68 | Temp 97.0°F | Ht 69.0 in | Wt 178.6 lb

## 2016-04-20 DIAGNOSIS — J441 Chronic obstructive pulmonary disease with (acute) exacerbation: Secondary | ICD-10-CM

## 2016-04-20 DIAGNOSIS — R001 Bradycardia, unspecified: Secondary | ICD-10-CM

## 2016-04-20 DIAGNOSIS — M791 Myalgia, unspecified site: Secondary | ICD-10-CM

## 2016-04-20 DIAGNOSIS — R0602 Shortness of breath: Secondary | ICD-10-CM

## 2016-04-20 MED ORDER — DOXYCYCLINE HYCLATE 100 MG PO TABS: 100.0000 mg | ORAL_TABLET | Freq: Two times a day (BID) | ORAL | 0 refills | Status: DC

## 2016-04-20 MED ORDER — PREDNISONE 20 MG PO TABS: ORAL_TABLET | ORAL | 0 refills | Status: DC

## 2016-04-20 MED ORDER — METHYLPREDNISOLONE ACETATE 80 MG/ML IJ SUSP
80.0000 mg | Freq: Once | INTRAMUSCULAR | Status: AC
  Administered 2016-04-20: 80 mg via INTRAMUSCULAR

## 2016-04-20 NOTE — Progress Notes (Signed)
BP 135/72   Pulse 68   Temp 97 F (36.1 C) (Oral)   Ht 5\' 9"  (1.753 m)   Wt 178 lb 9.6 oz (81 kg)   SpO2 94%   BMI 26.37 kg/m    Subjective:    Patient ID: [name], male    DOB: 02/23/1936, 81 y.o.   MRN: [id number]  HPI: [name] is a 81 y.o. male presenting on 04/20/2016 for Medication Problem (Livalo sample) and Breathing Problem   HPI Shortness of breath and cough and wheezing Patient comes in complaining of shortness of breath and cough and wheezing that's been worse over the past week. He does not do so in no this was correlated to his Livalo but it has been increasing. He has been using his albuterol and feels like it is not working much at this point. He says he is also been having more difficulty sleeping at night and waking up more frequently and has a loss of appetite and everything just doesn't taste as well. He feels like he has been wheezing a lot more and cannot control it. He denies any fevers or chills. Today in office is 94% on room air and he is normally not that low. He denies any chest pain with 100 100s to palpitations and feeling that his heart rate is slower.  Leg cramps and muscle aches Patient has had leg cramps and muscle aches that then causing more issues in the past couple weeks. He had started on Livalo was trying that despite having previous issues with statins and feels like he could not tolerate it. He stopped Livalo one week ago and his leg cramps have dissipated over the past week. He does not have any issues with them currently.  Relevant past medical, surgical, family and social history reviewed and updated as indicated. Interim medical history since our last visit reviewed. Allergies and medications reviewed and updated.  Review of Systems  Constitutional: Positive for fever. Negative for chills.  HENT: Positive for congestion, postnasal drip, rhinorrhea, sinus pressure and sore throat. Negative for ear discharge, ear pain,  sneezing and voice change.   Eyes: Negative for pain, discharge, redness and visual disturbance.  Respiratory: Positive for cough, shortness of breath and wheezing.   Cardiovascular: Negative for chest pain, palpitations and leg swelling.  Musculoskeletal: Positive for myalgias. Negative for gait problem.  Skin: Negative for rash.  All other systems reviewed and are negative.   Per HPI unless specifically indicated above     Objective:    BP 135/72   Pulse 68   Temp 97 F (36.1 C) (Oral)   Ht 5\' 9"  (1.753 m)   Wt 178 lb 9.6 oz (81 kg)   SpO2 94%   BMI 26.37 kg/m   Wt Readings from Last 3 Encounters:  04/20/16 178 lb 9.6 oz (81 kg)  03/18/16 182 lb 2 oz (82.6 kg)  03/11/16 182 lb 4 oz (82.7 kg)    Physical Exam  Constitutional: He is oriented to person, place, and time. He appears well-developed and well-nourished. No distress.  HENT:  Right Ear: Tympanic membrane, external ear and ear canal normal.  Left Ear: Tympanic membrane, external ear and ear canal normal.  Nose: Mucosal edema and rhinorrhea present. No sinus tenderness. No epistaxis. Right sinus exhibits maxillary sinus tenderness. Right sinus exhibits no frontal sinus tenderness. Left sinus exhibits maxillary sinus tenderness. Left sinus exhibits no frontal sinus tenderness.  Mouth/Throat: Uvula is midline and mucous membranes are normal.  Posterior oropharyngeal edema and posterior oropharyngeal erythema present. No oropharyngeal exudate or tonsillar abscesses.  Eyes: Conjunctivae and EOM are normal. Pupils are equal, round, and reactive to light. Right eye exhibits no discharge. No scleral icterus.  Neck: Neck supple. No thyromegaly present.  Cardiovascular: Normal rate, regular rhythm, normal heart sounds and intact distal pulses.   No murmur heard. Pulmonary/Chest: Effort normal. No respiratory distress. He has wheezes. He has no rhonchi. He has no rales.  Musculoskeletal: Normal range of motion. He exhibits no  edema.  Lymphadenopathy:    He has no cervical adenopathy.  Neurological: He is alert and oriented to person, place, and time. Coordination normal.  Skin: Skin is warm and dry. No rash noted. He is not diaphoretic.  Psychiatric: He has a normal mood and affect. His behavior is normal.  Nursing note and vitals reviewed.  Chest x-ray: Hyperexpanded lungs but otherwise normal, await final read by radiologist  EKG: 2 EKGs showed normal sinus rhythm with 1 PVC but otherwise normal.    Assessment & Plan:   Problem List Items Addressed This Visit    None    Visit Diagnoses    COPD exacerbation ([place])    -  Primary   Relevant Medications   doxycycline (VIBRA-TABS) 100 MG tablet   methylPREDNISolone acetate (DEPO-MEDROL) injection 80 mg (Start on 04/20/2016  2:30 PM)   predniSONE (DELTASONE) 20 MG tablet   Other Relevant Orders   DG Chest 2 View   Bradycardia       Patient was slightly bradycardic on pulse ox, EKG was normal.   Relevant Orders   EKG 12-Lead (Completed)   Myalgia       Myalgias have since resolved since stopping Livalo, will stay off of it and consider injectable agents in the future.       Follow up plan: Return if symptoms worsen or fail to improve.  Counseling provided for all of the vaccine components Orders Placed This Encounter  Procedures  . DG Chest 2 View  . EKG 12-Lead    Caryl Pina, MD [facility] 04/20/2016, 2:00 PM

## 2016-04-20 NOTE — Addendum Note (Signed)
Addended by: Zannie Cove on: 04/20/2016 05:17 PM   Modules accepted: Orders

## 2016-04-22 ENCOUNTER — Telehealth: Payer: Self-pay | Admitting: Family Medicine

## 2016-04-22 MED ORDER — BENZONATATE 100 MG PO CAPS: 100.0000 mg | ORAL_CAPSULE | Freq: Two times a day (BID) | ORAL | 0 refills | Status: DC | PRN

## 2016-04-22 NOTE — Telephone Encounter (Signed)
We can either call in the Tessalon Perles 100 twice a day and give him 30 or any kind of Robitussin or Mucinex can help with the cough

## 2016-05-06 ENCOUNTER — Encounter: Payer: Self-pay | Admitting: *Deleted

## 2016-05-10 ENCOUNTER — Ambulatory Visit (INDEPENDENT_AMBULATORY_CARE_PROVIDER_SITE_OTHER): Payer: Medicare Other | Admitting: Family Medicine

## 2016-05-10 ENCOUNTER — Encounter: Payer: Self-pay | Admitting: Family Medicine

## 2016-05-10 VITALS — BP 137/79 | HR 64 | Temp 97.0°F | Ht 69.0 in | Wt 179.0 lb

## 2016-05-10 DIAGNOSIS — M79605 Pain in left leg: Secondary | ICD-10-CM

## 2016-05-10 MED ORDER — CYCLOBENZAPRINE HCL 10 MG PO TABS: 10.0000 mg | ORAL_TABLET | Freq: Three times a day (TID) | ORAL | 0 refills | Status: DC | PRN

## 2016-05-10 NOTE — Progress Notes (Signed)
BP 137/79   Pulse 64   Temp 97 F (36.1 C) (Oral)   Ht _0  (1.753 m)   Wt 179 lb (81.2 kg)   BMI 26.43 kg/m    Subjective:    Patient ID: [name], male    DOB: [birth year], 81 y.o.   MRN: [id number]  HPI: [name] is a 81 y.o. male presenting on 05/10/2016 for severe leg cramp   HPI Leg cramps and muscle aches Patient comes in today with complaints of leg cramps and muscle aches that occur at night. He says he will get so crampy and stiff that he has to get up and move and that will resolve it but he has trouble sleeping at night because of the cramping and stiffness. He also has been having some swelling intermittently in the legs and has been using Lasix for that but had stopped the Lasix. He says 2 days ago he stopped taking the Lasix and the leg cramps have since improved. He denies any significant pains or leg cramps or numbness or weakness during the daytime. He did say one time when he got up he did have a severe pain on the sole of his left heel and actually fell because of the pain and that is part of what is bringing him in today. The pain is mostly in his left calf but does sometimes go up into his left hamstring is well. He initially thought that this was caused by statins but he is not currently on a statin. He has felt better since he's been trying to increase his electrolytes and stopped using the Lasix though and hopefully that is all it is but we are going to go ahead and further pursue any other possible causes.  Relevant past medical, surgical, family and social history reviewed and updated as indicated. Interim medical history since our last visit reviewed. Allergies and medications reviewed and updated.  Review of Systems  Constitutional: Negative for chills and fever.  Respiratory: Negative for shortness of breath and wheezing.   Cardiovascular: Negative for chest pain and leg swelling.  Musculoskeletal: Positive for myalgias. Negative for back pain  and gait problem.  Skin: Negative for color change and rash.  All other systems reviewed and are negative.   Per HPI unless specifically indicated above   Allergies as of 05/10/2016   No Known Allergies     Medication List       Accurate as of 05/10/16 10:37 AM. Always use your most recent med list.          albuterol 108 (90 Base) MCG/ACT inhaler Commonly known as:  PROVENTIL HFA;VENTOLIN HFA Inhale 2 puffs into the lungs every 6 (six) hours as needed for wheezing or shortness of breath.   aspirin 81 MG tablet Take 81 mg by mouth 2 (two) times daily.   atorvastatin 10 MG tablet Commonly known as:  LIPITOR Take 10 mg by mouth daily.   benzonatate 100 MG capsule Commonly known as:  TESSALON Take 1 capsule (100 mg total) by mouth 2 (two) times daily as needed for cough.   cetirizine 10 MG tablet Commonly known as:  ZYRTEC Take 1 tablet (10 mg total) by mouth daily.   CoQ10 100 MG Caps Take 200 mg by mouth daily.   cyclobenzaprine 10 MG tablet Commonly known as:  FLEXERIL Take 1 tablet (10 mg total) by mouth 3 (three) times daily as needed for muscle spasms.   doxycycline 100 MG tablet Commonly known  as:  VIBRA-TABS Take 1 tablet (100 mg total) by mouth 2 (two) times daily. 1 po bid   ergocalciferol 50000 units capsule Commonly known as:  VITAMIN D2 Take 50,000 Units by mouth once a week.   ezetimibe 10 MG tablet Commonly known as:  ZETIA Take 1 tablet (10 mg total) by mouth daily.   fenofibrate micronized 134 MG capsule Commonly known as:  LOFIBRA Take 1 capsule (134 mg total) by mouth daily before breakfast.   furosemide 20 MG tablet Commonly known as:  LASIX Take 1 tablet (20 mg total) by mouth daily.   magnesium gluconate 500 MG tablet Commonly known as:  MAGONATE Take 500 mg by mouth 2 (two) times daily.   mirabegron ER 50 MG Tb24 tablet Commonly known as:  MYRBETRIQ Take 1 tablet (50 mg total) by mouth daily.   omeprazole 20 MG  capsule Commonly known as:  PRILOSEC Take 1 capsule (20 mg total) by mouth daily.   Pitavastatin Calcium 2 MG Tabs Take by mouth. Sample   predniSONE 20 MG tablet Commonly known as:  DELTASONE 2 po at same time daily for 5 days   ramipril 10 MG capsule Commonly known as:  ALTACE TAKE 1 CAPSULE DAILY          Objective:    BP 137/79   Pulse 64   Temp 97 F (36.1 C) (Oral)   Ht _0  (1.753 m)   Wt 179 lb (81.2 kg)   BMI 26.43 kg/m   Wt Readings from Last 3 Encounters:  05/10/16 179 lb (81.2 kg)  04/20/16 178 lb 9.6 oz (81 kg)  03/18/16 182 lb 2 oz (82.6 kg)    Physical Exam  Constitutional: He is oriented to person, place, and time. He appears well-developed and well-nourished. No distress.  Eyes: Conjunctivae are normal. Right eye exhibits no discharge. Left eye exhibits no discharge. No scleral icterus.  Cardiovascular: Normal rate, regular rhythm, normal heart sounds and intact distal pulses.   No murmur heard. Pulmonary/Chest: Effort normal and breath sounds normal. No respiratory distress. He has no wheezes. He has no rales.  Musculoskeletal: Normal range of motion. He exhibits no edema.       Left lower leg: He exhibits tenderness (Tenderness over left mid calf, no tenderness noted in foot or anywhere else in the leg. No swelling noted today.). He exhibits no bony tenderness, no swelling, no edema and no deformity.  Neurological: He is alert and oriented to person, place, and time. Coordination normal.  Skin: Skin is warm and dry. No rash noted. He is not diaphoretic.  Psychiatric: He has a normal mood and affect. His behavior is normal.  Nursing note and vitals reviewed.   Results for orders placed or performed in visit on 03/18/16  CMP14+EGFR  Result Value Ref Range   Glucose 107 (H) 65 - 99 mg/dL   BUN 17 8 - 27 mg/dL   Creatinine, Ser 1.25 0.76 - 1.27 mg/dL   GFR calc non Af Amer 55 (L) >59 mL/min/1.73   GFR calc Af Amer 63 >59 mL/min/1.73    BUN/Creatinine Ratio 14 10 - 24   Sodium 141 134 - 144 mmol/L   Potassium 4.4 3.5 - 5.2 mmol/L   Chloride 103 96 - 106 mmol/L   CO2 22 18 - 29 mmol/L   Calcium 9.5 8.6 - 10.2 mg/dL   Total Protein 6.4 6.0 - 8.5 g/dL   Albumin 4.1 3.5 - 4.8 g/dL   Globulin, Total 2.3  1.5 - 4.5 g/dL   Albumin/Globulin Ratio 1.8 1.2 - 2.2   Bilirubin Total 0.2 0.0 - 1.2 mg/dL   Alkaline Phosphatase 57 39 - 117 IU/L   AST 33 0 - 40 IU/L   ALT 33 0 - 44 IU/L  Lipid panel  Result Value Ref Range   Cholesterol, Total 187 100 - 199 mg/dL   Triglycerides 112 0 - 149 mg/dL   HDL 36 (L) >39 mg/dL   VLDL Cholesterol Cal 22 5 - 40 mg/dL   LDL Calculated 129 (H) 0 - 99 mg/dL   Chol/HDL Ratio 5.2 (H) 0.0 - 5.0 ratio units  Bayer DCA Hb A1c Waived  Result Value Ref Range   Bayer DCA Hb A1c Waived 5.8 <7.0 %      Assessment & Plan:   Problem List Items Addressed This Visit    None    Visit Diagnoses    Left leg pain    -  Primary   We'll do ultrasound to make sure no clots, use Lasix only when necessary and send a muscle relaxer.   Relevant Medications   cyclobenzaprine (FLEXERIL) 10 MG tablet   Other Relevant Orders   US Venous Img Lower Unilateral Left       Follow up plan: Return if symptoms worsen or fail to improve.  Counseling provided for all of the vaccine components Orders Placed This Encounter  Procedures  . US Venous Img Lower Unilateral Left    Caryl Pina, MD [facility] 05/10/2016, 10:37 AM

## 2016-05-12 ENCOUNTER — Encounter: Payer: Self-pay | Admitting: Pharmacist

## 2016-05-12 ENCOUNTER — Ambulatory Visit (INDEPENDENT_AMBULATORY_CARE_PROVIDER_SITE_OTHER): Payer: Medicare Other | Admitting: Pharmacist

## 2016-05-12 VITALS — BP 122/70 | HR 75 | Ht 69.0 in | Wt 179.0 lb

## 2016-05-12 DIAGNOSIS — I25119 Atherosclerotic heart disease of native coronary artery with unspecified angina pectoris: Secondary | ICD-10-CM

## 2016-05-12 DIAGNOSIS — E785 Hyperlipidemia, unspecified: Secondary | ICD-10-CM

## 2016-05-12 MED ORDER — EVOLOCUMAB 140 MG/ML ~~LOC~~ SOAJ: 140.0000 mg | SUBCUTANEOUS | 1 refills | Status: DC

## 2016-05-12 NOTE — Progress Notes (Signed)
Patient ID: [name], male   DOB: [birth year], 81 y.o.   MRN: [id number]   Subjective:    [name] is a 81 y.o. male who presents for evaluation of dyslipidemia. His PCP has referred his for education and treatment with Repatha.  [name] has tried multiple statins and has been unable to tolerate them due to myalgias.  He has also tried COEnzyme Q10 along with statins and still has myalgias.  He is currently taking Zetin10mg  and Fenofibrate 145mg  qd.  Past therapies:  Livalo 2mg  tablets - took for 4 weeks (01/2016 until 02/2016) then stopped due to myalgias Atorvastatin 10mg  tablets - took qd from 05/2015 until 01/2016 Rosuvastatin 10mg  - took in 2014 Zocor / simvastatin - patient recalls taking due date and length of time unknown.  The patient does not use medications that may worsen dyslipidemias (corticosteroids, progestins, anabolic steroids, diuretics, beta-blockers, amiodarone, cyclosporine, olanzapine). Exercise: intermittently. Previous history of cardiac disease includes: Coronary Artery Disease.  Cardiac Risk Factors Age > 45-male, > 55-male:  YES  +1  Smoking:   NO  Sig. family hx of CHD*:  YES  +1  Hypertension:   YES  +1  Diabetes:   NO  HDL < 40:   YES  HDL > 59:   NO  Total:  4   *Significant family history of CHD per NCEP = MI or sudden death at less than 69 year old in father or other 1st-degree male relative, or less than 61 year old in mother or  other 1st-degree male relative  The following portions of the patient's history were reviewed and updated as appropriate: allergies, current medications, past family history, past medical history, past social history, past surgical history and problem list.    Objective:    Lab Review Lab Results  Component Value Date   CHOL 187 03/18/2016   CHOL 140 12/17/2015   TRIG 112 03/18/2016   TRIG 86 12/17/2015   HDL 36 (L) 03/18/2016   HDL 41 12/17/2015      Assessment:    Dyslipidemia as detailed  above with history of CAD / carotid artery blockage  Target levels for LDL are: < 70 mg/dl ("very high" risk for CHD)  Explained to the patient the respective contributions of genetics, diet, and exercise to lipid levels and the use of medication in severe cases which do not respond to lifestyle alteration. The patient's interest and motivation in making lifestyle changes seems excellent.     Plan:    The following changes are planned for the next 3 months, at which time the patient will return for repeat fasting lipids:  1. Dietary changes: Reduce saturated fat, "trans" monounsaturated fatty acids, and cholesterol 2. Exercise changes:  Advised to engage in walking 30 minutes and  frequency goal is 3-4 times a week 3. Lipid-lowering medications: start Repatha 140mg  every 2 weeks.  First injeciton given in office today.  Reviewed proper adminstration.  Rx sent to [facility] - anticipate that we will need to do PA.  Awaiting paperwork (Recommended by NCEP after 3-6 months of dietary therapy & lifestyle modification,  except if CHD is present or LDL well above 190.) 4.  Recheck LIPIDS in 2 months.

## 2016-05-12 NOTE — Patient Instructions (Addendum)
We will send paperwork for approval of Repatha for cholesterol  If you have not been contacted by either [facility] or myself by 05/26/2016 then call me - Cherre Robins [phone]

## 2016-05-13 ENCOUNTER — Ambulatory Visit (HOSPITAL_COMMUNITY)
Admission: RE | Admit: 2016-05-13 | Discharge: 2016-05-13 | Disposition: A | Discharge status: Home / Self Care | Payer: Medicare Other | Source: Ambulatory Visit | Attending: Family Medicine | Admitting: Family Medicine

## 2016-05-13 DIAGNOSIS — M79605 Pain in left leg: Secondary | ICD-10-CM | POA: Insufficient documentation

## 2016-05-13 DIAGNOSIS — R600 Localized edema: Secondary | ICD-10-CM | POA: Diagnosis not present

## 2016-05-20 ENCOUNTER — Telehealth: Payer: Self-pay

## 2016-05-20 DIAGNOSIS — I25119 Atherosclerotic heart disease of native coronary artery with unspecified angina pectoris: Secondary | ICD-10-CM

## 2016-05-20 DIAGNOSIS — E7800 Pure hypercholesterolemia, unspecified: Secondary | ICD-10-CM

## 2016-05-20 NOTE — Telephone Encounter (Signed)
Okay; thanks.

## 2016-05-20 NOTE — Telephone Encounter (Signed)
Patient called stating that [facility] gave him a number for [place] to call for his Prior Auth for his Repatha- [phone]. Patient aware that prior autho nor Lynelle Smoke is here today. Per office note from 2/22 we are awaiting for the PA and for patient to contact the office by 3/8 if he has not heard back. Any status on prior auth? Patient aware it will be Monday before he gets a call back from our office.

## 2016-05-20 NOTE — Telephone Encounter (Signed)
I have reviewed prior authorization criteria for [employer] for Repatha.  They require that Repatha be prescribed by cardiologist, lipidoligist or endocrinologist.  Patient last saw Dr Mare Ferrari about 2-3 years ago.  Will send cardio referral - patient requests to see Dr Jenkins Rouge in [place] if possible.  Patient is aware of plan.

## 2016-05-20 NOTE — Addendum Note (Signed)
Addended by: Cherre Robins on: 05/20/2016 11:48 AM   Modules accepted: Orders

## 2016-05-22 ENCOUNTER — Other Ambulatory Visit: Payer: Self-pay | Admitting: Family Medicine

## 2016-05-23 ENCOUNTER — Telehealth: Payer: Self-pay | Admitting: Family Medicine

## 2016-05-24 MED ORDER — EVOLOCUMAB 140 MG/ML ~~LOC~~ SOAJ: 140.0000 mg | SUBCUTANEOUS | 0 refills | Status: DC

## 2016-05-24 NOTE — Telephone Encounter (Signed)
Left #1 sample for patient - he is seeing cardiologist in 1 week.  Ins required cardio prescribe Repatha

## 2016-05-26 ENCOUNTER — Encounter: Payer: Self-pay | Admitting: Cardiology

## 2016-05-28 ENCOUNTER — Other Ambulatory Visit: Payer: Self-pay | Admitting: Family Medicine

## 2016-06-01 NOTE — Progress Notes (Signed)
Cardiology Office Note   Date:  06/02/2016   ID:  [name], DOB 09/08/1935, MRN [id number]  PCP:  Worthy Rancher, MD  Cardiologist:   Minus Breeding, MD  Referring:  Dettinger, Fransisca Kaufmann, MD   Chief Complaint  Patient presents with  . Dyslipidemia     History of Present Illness: [name] is a 81 y.o. male who presents for evaluation of dyslipidemia.  He's had this for some time and has been intolerant of all statins. He gets diffuse muscle aches. He does have peripheral vascular disease.  He previously saw Dr. Mare Ferrari once in 2015 for bradycardia.  He had NSR with PVCs/bigeminy.  He apparently did no perfuse the PVCs.  He has had CEA in the past.   He's had otherwise no significant past cardiac history. He's had negative stress testing some years ago. He does get dyspnea on exertion but has some COPD. For further evaluation He did have an essentially normal echo in 2017.  However, he denies any chest pressure, neck or arm discomfort. He doesn't have any palpitations despite premature ventricular contractions noted. He's not had any presyncope or syncope. He tries to stay as active as he can with his breathing difficulties. He still works.   Past Medical History:  Diagnosis Date  . Adenomatous colon polyp 11/1999  . Blood transfusion without reported diagnosis   . Cancer [facility])    Prostate  . Carotid artery occlusion   . Cataract   . Hyperlipidemia   . Hypertension   . Internal hemorrhoids     Past Surgical History:  Procedure Laterality Date  . APPENDECTOMY    . carotid surgery    . CATARACT EXTRACTION     both eyes  . COLONOSCOPY    . TRANSURETHRAL RESECTION OF PROSTATE    . UPPER GI ENDOSCOPY  Nov. 3, 2014   Upper endoscopy with diatation     Current Outpatient Prescriptions  Medication Sig Dispense Refill  . albuterol (PROVENTIL HFA;VENTOLIN HFA) 108 (90 Base) MCG/ACT inhaler Inhale 2 puffs into the lungs every 6 (six) hours as needed for  wheezing or shortness of breath. 3 Inhaler 3  . aspirin 81 MG tablet Take 81 mg by mouth 2 (two) times daily.     . cetirizine (ZYRTEC) 10 MG tablet Take 1 tablet (10 mg total) by mouth daily. 90 tablet 3  . Coenzyme Q10 (COQ10) 100 MG CAPS Take 200 mg by mouth daily.    . Evolocumab (REPATHA SURECLICK) 283 MG/ML SOAJ Inject 140 mg into the skin every 14 (fourteen) days. 1 pen 0  . ezetimibe (ZETIA) 10 MG tablet Take 1 tablet (10 mg total) by mouth daily. 90 tablet 1  . fenofibrate micronized (LOFIBRA) 134 MG capsule Take 1 capsule (134 mg total) by mouth daily before breakfast. 30 capsule 0  . magnesium gluconate (MAGONATE) 500 MG tablet Take 500 mg by mouth 2 (two) times daily.    . mirabegron ER (MYRBETRIQ) 50 MG TB24 tablet Take 1 tablet (50 mg total) by mouth daily. 30 tablet 1  . omeprazole (PRILOSEC) 20 MG capsule TAKE 1 CAPSULE DAILY 90 capsule 1  . potassium gluconate 595 (99 K) MG TABS tablet Take 595 mg by mouth daily.    . ramipril (ALTACE) 10 MG capsule TAKE 1 CAPSULE DAILY 90 capsule 1  . Vitamin D, Ergocalciferol, (DRISDOL) 50000 units CAPS capsule TAKE 1 CAPSULE WEEKLY 12 capsule 3   No current facility-administered medications for this visit.  Allergies:   Patient has no known allergies.    ROS:  Please see the history of present illness.   Otherwise, review of systems are positive for none.   All other systems are reviewed and negative.    PHYSICAL EXAM: VS:  BP (!) 160/80 (BP Location: Right Arm, Patient Position: Sitting, Cuff Size: Normal)   Pulse 63   Ht 5\' 9"  (1.753 m)   Wt 181 lb (82.1 kg)   BMI 26.73 kg/m  , BMI Body mass index is 26.73 kg/m. GENERAL:  Well appearing HEENT:  Pupils equal round and reactive, fundi not visualized, oral mucosa unremarkable NECK:  No jugular venous distention, waveform within normal limits, carotid upstroke brisk and symmetric, no bruits, no thyromegaly LYMPHATICS:  No cervical, inguinal adenopathy LUNGS:  Clear to  auscultation bilaterally BACK:  No CVA tenderness CHEST:  Unremarkable HEART:  PMI not displaced or sustained,S1 and S2 within normal limits, no S3, no S4, no clicks, no rubs, no murmurs ABD:  Flat, positive bowel sounds normal in frequency in pitch, no bruits, no rebound, no guarding, no midline pulsatile mass, no hepatomegaly, no splenomegaly EXT:  2 plus pulses throughout, no edema, no cyanosis no clubbing SKIN:  No rashes no nodules NEURO:  Cranial nerves II through XII grossly intact, motor grossly intact throughout PSYCH:  Cognitively intact, oriented to person place and time   EKG:  EKG is not ordered today. The ekg ordered 04/20/16 demonstrates sinus rhythm, rate 64, low voltage in the limb leads, axis within normal limits, intervals within normal limits, premature ventricular contraction.   Recent Labs: 08/26/2015: BNP 71.6; TSH 3.180 03/18/2016: ALT 33; BUN 17; Creatinine, Ser 1.25; Potassium 4.4; Sodium 141    Lipid Panel    Component Value Date/Time   CHOL 187 03/18/2016 0826   TRIG 112 03/18/2016 0826   HDL 36 (L) 03/18/2016 0826   CHOLHDL 5.2 (H) 03/18/2016 0826   LDLCALC 129 (H) 03/18/2016 0826      Wt Readings from Last 3 Encounters:  06/02/16 181 lb (82.1 kg)  05/12/16 179 lb (81.2 kg)  05/10/16 179 lb (81.2 kg)      Other studies Reviewed: Additional studies/ records that were reviewed today include: Office records. Review of the above records demonstrates:  Please see elsewhere in the note.     ASSESSMENT AND PLAN:  CAROTID STENOSIS:  He is up to date with follow up with mild stenosis noted in 2017.  No change in therapy.   DYSLIPIDEMIA:  He has been intolerant of statins. He would benefit because of his peripheral vascular disease from having PCSK9 inhibitors. I will prescribe these. He'll be followed in the lipid clinic. [facility].   HTN:  This is elevated today but this is quite unusual. I reviewed previous blood pressure readings and no  change in therapy is indicated.  COPD:  He has SOB.  This is baseline. He does not need stress perfusion imaging and would not be able to have a POET (Plain Old Exercise Treadmill) he will have continued risk reduction. He's no longer smoking..     Current medicines are reviewed at length with the patient today.  The patient does not have concerns regarding medicines.  The following changes have been made:  no change  Labs/ tests ordered today include: None No orders of the defined types were placed in this encounter.    Disposition:   FU with me in one year.     Signed,  Minus Breeding, MD  06/02/2016 1:37 PM    [place]

## 2016-06-02 ENCOUNTER — Ambulatory Visit (INDEPENDENT_AMBULATORY_CARE_PROVIDER_SITE_OTHER): Payer: Medicare Other | Admitting: Cardiology

## 2016-06-02 ENCOUNTER — Encounter: Payer: Self-pay | Admitting: Cardiology

## 2016-06-02 VITALS — BP 160/80 | HR 63 | Ht 69.0 in | Wt 181.0 lb

## 2016-06-02 DIAGNOSIS — I493 Ventricular premature depolarization: Secondary | ICD-10-CM

## 2016-06-02 DIAGNOSIS — I779 Disorder of arteries and arterioles, unspecified: Secondary | ICD-10-CM | POA: Diagnosis not present

## 2016-06-02 DIAGNOSIS — J449 Chronic obstructive pulmonary disease, unspecified: Secondary | ICD-10-CM | POA: Diagnosis not present

## 2016-06-02 DIAGNOSIS — E785 Hyperlipidemia, unspecified: Secondary | ICD-10-CM | POA: Diagnosis not present

## 2016-06-02 DIAGNOSIS — I739 Peripheral vascular disease, unspecified: Principal | ICD-10-CM

## 2016-06-02 DIAGNOSIS — I25119 Atherosclerotic heart disease of native coronary artery with unspecified angina pectoris: Secondary | ICD-10-CM

## 2016-06-02 NOTE — Patient Instructions (Signed)

## 2016-06-08 ENCOUNTER — Telehealth: Payer: Self-pay | Admitting: Pharmacist Clinician (PhC)/ Clinical Pharmacy Specialist

## 2016-06-08 DIAGNOSIS — E785 Hyperlipidemia, unspecified: Secondary | ICD-10-CM

## 2016-06-08 NOTE — Telephone Encounter (Signed)
Patient referred to our practice for lipid management.  Saw Dr. Percival Spanish on March 15.  Was originally seen by his family practice for PCSK-9 therapy, but was denied , as insurance required cardiology for approval of medication.    Patient history significant for carotid endarterectomy in August of 2007.   He has been unable to tolerate multiple statin medications (see below), but does continue to take ezetimibe 10 mg daily   Livalo 2mg  tablets - took for 4 weeks (01/2016 until 02/2016) then stopped due to myalgias  Atorvastatin 10mg  tablets - took qd from 05/2015 until 01/2016  Rosuvastatin 10mg  - took in 2014  Zocor / simvastatin - patient recalls taking due date and length of time unknown.  Family history is significant for myocardial infarction in his father (age 26, deceased) and 2 cousins (both deceased in their 59's)  [name] is a former smoker, having quit in 1998; he does not drink alcohol  Will submit information to his insurance company [employer]).

## 2016-06-13 ENCOUNTER — Telehealth: Payer: Self-pay | Admitting: Family Medicine

## 2016-06-13 NOTE — Telephone Encounter (Signed)
[employer] required that Repatha be prescribed by cardiologist.  I looks like clinical pharmacist from Dr [facility] office submitted paperwork to [place] on Wednesday 06/08/16. I recommend patient allow 7 days for review.  If he does not hear from them by Wednesday, March 28th then call back and I will try to call [employer] to check on status.

## 2016-06-14 MED ORDER — EVOLOCUMAB 140 MG/ML ~~LOC~~ SOAJ: 140.0000 mg | SUBCUTANEOUS | 3 refills | Status: DC

## 2016-06-14 NOTE — Addendum Note (Signed)
Addended by: Erskine Emery on: 06/14/2016 03:19 PM   Modules accepted: Orders

## 2016-06-14 NOTE — Telephone Encounter (Addendum)
Received notification that Repatha was approved. RX sent to [facility] per pt request. He will need samples to get through until arrives. He will come pick up samples tomorrow.

## 2016-06-16 ENCOUNTER — Other Ambulatory Visit: Payer: Self-pay | Admitting: Family Medicine

## 2016-06-16 NOTE — Telephone Encounter (Signed)
Pt called to report that medication arrived within 24 hours and he no longer needs sample medications. He will need follow up labs at the end of April. Order entered and mailed to patient to be drawn in [place].

## 2016-06-16 NOTE — Addendum Note (Signed)
Addended by: Erskine Emery on: 06/16/2016 09:02 AM   Modules accepted: Orders

## 2016-06-22 ENCOUNTER — Other Ambulatory Visit: Payer: Self-pay | Admitting: Family Medicine

## 2016-06-23 ENCOUNTER — Ambulatory Visit (INDEPENDENT_AMBULATORY_CARE_PROVIDER_SITE_OTHER): Payer: Medicare Other | Admitting: Family Medicine

## 2016-06-23 ENCOUNTER — Encounter: Payer: Self-pay | Admitting: Family Medicine

## 2016-06-23 VITALS — BP 134/76 | HR 65 | Temp 97.6°F | Ht 69.0 in | Wt 179.0 lb

## 2016-06-23 DIAGNOSIS — J441 Chronic obstructive pulmonary disease with (acute) exacerbation: Secondary | ICD-10-CM

## 2016-06-23 DIAGNOSIS — I25119 Atherosclerotic heart disease of native coronary artery with unspecified angina pectoris: Secondary | ICD-10-CM

## 2016-06-23 MED ORDER — UMECLIDINIUM BROMIDE 62.5 MCG/INH IN AEPB: 1.0000 | INHALATION_SPRAY | Freq: Every day | RESPIRATORY_TRACT | 2 refills | Status: DC

## 2016-06-23 NOTE — Progress Notes (Signed)
06/23/2016  CC: Increased SOB  Subjective: Patient reports and increase in shortness of breath over the past month. Patient states he believes his COPD is worsening, patient is using his rescue inhaler twice a day, but did not realize he could use it more often in one day. He does not feel like his rescue inhaler is working to make it improved as much as it normally is. Patient states he is unable to take trash out without becoming short of breath but denies shortness of breath at rest. Patient admits 40 pack year history but quit 23 years ago. Patient denies chest pain, dizziness, headache, or cough.  Allergies as of 06/23/2016   No Known Allergies     Medication List       Accurate as of 06/23/16 11:59 PM. Always use your most recent med list.          albuterol 108 (90 Base) MCG/ACT inhaler Commonly known as:  PROVENTIL HFA;VENTOLIN HFA Inhale 2 puffs into the lungs every 6 (six) hours as needed for wheezing or shortness of breath.   aspirin 81 MG tablet Take 81 mg by mouth 2 (two) times daily.   cetirizine 10 MG tablet Commonly known as:  ZYRTEC TAKE 1 TABLET DAILY   CoQ10 100 MG Caps Take 200 mg by mouth daily.   Evolocumab 140 MG/ML Soaj Commonly known as:  REPATHA SURECLICK Inject 101 mg into the skin every 14 (fourteen) days.   ezetimibe 10 MG tablet Commonly known as:  ZETIA Take 1 tablet (10 mg total) by mouth daily.   fenofibrate micronized 134 MG capsule Commonly known as:  LOFIBRA Take 1 capsule (134 mg total) by mouth daily before breakfast.   magnesium gluconate 500 MG tablet Commonly known as:  MAGONATE Take 500 mg by mouth 2 (two) times daily.   mirabegron ER 50 MG Tb24 tablet Commonly known as:  MYRBETRIQ Take 1 tablet (50 mg total) by mouth daily.   omeprazole 20 MG capsule Commonly known as:  PRILOSEC TAKE 1 CAPSULE DAILY   potassium gluconate 595 (99 K) MG Tabs tablet Take 595 mg by mouth daily.   ramipril 10 MG capsule Commonly known as:   ALTACE TAKE 1 CAPSULE DAILY   umeclidinium bromide 62.5 MCG/INH Aepb Commonly known as:  INCRUSE ELLIPTA Inhale 1 puff into the lungs daily.   Vitamin D (Ergocalciferol) 50000 units Caps capsule Commonly known as:  DRISDOL TAKE 1 CAPSULE WEEKLY       Objective: Blood pressure 134/76, pulse 65, temperature 97.6 F (36.4 C), temperature source Oral, height 5\' 9"  (1.753 m), weight 179 lb (81.2 kg), SpO2 97 %.  Physical Exam  Well-developed, well-nourished, caucasian male, NAD Head: Normocephalic, Atraumatic Eyes: EOMI, PERRLA Throat: non-erythematous, non-edematous Neck: normal ROM, Supple Pulmonary: Decreased breath sounds, no wheezing, no rhonchi, no rales, no use of accessory muscles. C/V: RRR, no murmurs, no rubs, no gallops GI: Soft, non-tender MSK: Normal ROM Extremities: Bilateral 1+ pitting edema to lower extremites Skin: Warm, dry, intact Psych: Normal mood and affect Neurologic: Alert and oriented x4, cranial nerves grossly intact  Problem List Items Addressed This Visit    None    Visit Diagnoses    COPD exacerbation ([place])    -  Primary   Relevant Medications   umeclidinium bromide (INCRUSE ELLIPTA) 62.5 MCG/INH AEPB    Patient seen and examined with Nuala Alpha, agree with assessment and plan an note above, will start patient on incruse ellipta. Have follow-up to see how he  is going in a few months.

## 2016-07-18 ENCOUNTER — Other Ambulatory Visit: Payer: Medicare Other

## 2016-07-18 ENCOUNTER — Telehealth: Payer: Self-pay | Admitting: Cardiology

## 2016-07-18 DIAGNOSIS — E7800 Pure hypercholesterolemia, unspecified: Secondary | ICD-10-CM | POA: Diagnosis not present

## 2016-07-18 LAB — LIPID PANEL
CHOL/HDL RATIO: 2.9 ratio (ref 0.0–5.0)
CHOLESTEROL TOTAL: 100 mg/dL (ref 100–199)
HDL: 35 mg/dL — ABNORMAL LOW (ref >39)
LDL Calculated: 33 mg/dL (ref 0–99)
Triglycerides: 162 mg/dL — ABNORMAL HIGH (ref 0–149)
VLDL Cholesterol Cal: 32 mg/dL (ref 5–40)

## 2016-07-18 NOTE — Telephone Encounter (Signed)
Order placed correctly in EP[facility].

## 2016-07-18 NOTE — Telephone Encounter (Signed)
New Message:    Pt needs a new order to [place] from [facility].Please fax to [phone].

## 2016-07-25 ENCOUNTER — Telehealth: Payer: Self-pay | Admitting: Family Medicine

## 2016-07-25 ENCOUNTER — Other Ambulatory Visit: Payer: Self-pay | Admitting: Family Medicine

## 2016-07-25 NOTE — Telephone Encounter (Signed)
Which meds does he need??? LM for pt to call back

## 2016-07-26 ENCOUNTER — Telehealth: Payer: Self-pay | Admitting: Family Medicine

## 2016-07-26 DIAGNOSIS — N3281 Overactive bladder: Secondary | ICD-10-CM

## 2016-07-27 MED ORDER — MIRABEGRON ER 50 MG PO TB24: 50.0000 mg | ORAL_TABLET | Freq: Every day | ORAL | 1 refills | Status: DC

## 2016-07-27 NOTE — Telephone Encounter (Signed)
Spoke with [facility] and they just needed a new rx sent in. Rx sent in to pharmacy

## 2016-08-04 ENCOUNTER — Other Ambulatory Visit: Payer: Self-pay

## 2016-08-04 DIAGNOSIS — J441 Chronic obstructive pulmonary disease with (acute) exacerbation: Secondary | ICD-10-CM

## 2016-08-04 MED ORDER — UMECLIDINIUM BROMIDE 62.5 MCG/INH IN AEPB: 1.0000 | INHALATION_SPRAY | Freq: Every day | RESPIRATORY_TRACT | 0 refills | Status: DC

## 2016-08-10 NOTE — Telephone Encounter (Signed)
Taken care of in different encounter

## 2016-09-20 ENCOUNTER — Other Ambulatory Visit: Payer: Self-pay | Admitting: Family Medicine

## 2016-09-22 ENCOUNTER — Ambulatory Visit: Payer: Medicare Other | Admitting: Family Medicine

## 2016-09-26 ENCOUNTER — Encounter: Payer: Self-pay | Admitting: Family Medicine

## 2016-09-26 ENCOUNTER — Ambulatory Visit (INDEPENDENT_AMBULATORY_CARE_PROVIDER_SITE_OTHER): Payer: Medicare Other | Admitting: Family Medicine

## 2016-09-26 VITALS — BP 120/66 | HR 56 | Temp 96.9°F | Ht 69.0 in | Wt 184.0 lb

## 2016-09-26 DIAGNOSIS — I10 Essential (primary) hypertension: Secondary | ICD-10-CM | POA: Diagnosis not present

## 2016-09-26 DIAGNOSIS — J439 Emphysema, unspecified: Secondary | ICD-10-CM | POA: Diagnosis not present

## 2016-09-26 DIAGNOSIS — I25119 Atherosclerotic heart disease of native coronary artery with unspecified angina pectoris: Secondary | ICD-10-CM | POA: Diagnosis not present

## 2016-09-26 DIAGNOSIS — E785 Hyperlipidemia, unspecified: Secondary | ICD-10-CM

## 2016-09-26 NOTE — Progress Notes (Signed)
BP 120/66   Pulse (!) 56   Temp (!) 96.9 F (36.1 C) (Oral)   Ht '5\' 9"'  (1.753 m)   Wt 184 lb (83.5 kg)   BMI 27.17 kg/m    Subjective:    Patient ID: [name], male    DOB: 1935-08-25, 81 y.o.   MRN: [id number]  HPI: [name] is a 81 y.o. male presenting on 09/26/2016 for Follow-up (pt here today for routine follow up of his chronic medical conditions.)   HPI Hyperlipidemia Patient is coming in for recheck of his hyperlipidemia. The patient is currently taking Zetia and fenofibrate and repatha, he gets myalgias the first 2 days that he takes his injection but then they go away. He says that he also gets some nasal congestion but it's not too severe. They deny any issues with myalgias or history of liver damage from it. They deny any focal numbness or weakness or chest pain.   Hypertension Patient is currently on Altase, and their blood pressure today is 120/66. Patient denies any lightheadedness or dizziness. Patient denies headaches, blurred vision, chest pains, shortness of breath, or weakness. Denies any side effects from medication and is content with current medication.   COPD recheck Patient is coming in for COPD recheck today. He is currently on albuterol and incruse ellipta. He says that he occasionally gets some symptoms but less than once a week and they're mostly on exertion and especially now that it's been warmer and humid he is getting some symptoms. He has rarely used his albuterol inhaler except on exertion  Relevant past medical, surgical, family and social history reviewed and updated as indicated. Interim medical history since our last visit reviewed. Allergies and medications reviewed and updated.  Review of Systems  Constitutional: Negative for chills and fever.  HENT: Positive for rhinorrhea. Negative for congestion.   Eyes: Negative for discharge.  Respiratory: Negative for cough, shortness of breath and wheezing.   Cardiovascular: Negative for  chest pain and leg swelling.  Musculoskeletal: Negative for back pain and gait problem.  Skin: Negative for rash.  Neurological: Negative for dizziness, weakness and numbness.  All other systems reviewed and are negative.   Per HPI unless specifically indicated above     Objective:    BP 120/66   Pulse (!) 56   Temp (!) 96.9 F (36.1 C) (Oral)   Ht '5\' 9"'  (1.753 m)   Wt 184 lb (83.5 kg)   BMI 27.17 kg/m   Wt Readings from Last 3 Encounters:  09/26/16 184 lb (83.5 kg)  06/23/16 179 lb (81.2 kg)  06/02/16 181 lb (82.1 kg)    Physical Exam  Constitutional: He is oriented to person, place, and time. He appears well-developed and well-nourished. No distress.  Eyes: Conjunctivae are normal. No scleral icterus.  Neck: Neck supple. No thyromegaly present.  Cardiovascular: Normal rate, regular rhythm, normal heart sounds and intact distal pulses.   No murmur heard. Pulmonary/Chest: Effort normal and breath sounds normal. No respiratory distress. He has no wheezes. He has no rales.  Musculoskeletal: Normal range of motion. He exhibits no edema.  Lymphadenopathy:    He has no cervical adenopathy.  Neurological: He is alert and oriented to person, place, and time. No cranial nerve deficit. Coordination normal.  Skin: Skin is warm and dry. No rash noted. He is not diaphoretic.  Psychiatric: He has a normal mood and affect. His behavior is normal.  Nursing note and vitals reviewed.  Assessment & Plan:   Problem List Items Addressed This Visit      Cardiovascular and Mediastinum   HTN (hypertension), benign   Relevant Orders   CMP14+EGFR     Respiratory   COPD (chronic obstructive pulmonary disease) ([place])   Relevant Orders   CBC with Differential/Platelet     Other   Hyperlipidemia LDL goal <100 - Primary   Relevant Orders   Lipid panel       Follow up plan: Return in about 6 months (around 03/29/2017), or if symptoms worsen or fail to improve, for Cholesterol and  hypertension recheck.  Counseling provided for all of the vaccine components Orders Placed This Encounter  Procedures  . CMP14+EGFR  . CBC with Differential/Platelet  . Lipid panel    Caryl Pina, MD [facility] 09/26/2016, 8:29 AM

## 2016-09-27 LAB — LIPID PANEL
CHOL/HDL RATIO: 2.1 ratio (ref 0.0–5.0)
CHOLESTEROL TOTAL: 78 mg/dL — AB (ref 100–199)
HDL: 38 mg/dL — ABNORMAL LOW (ref >39)
LDL CALC: 15 mg/dL (ref 0–99)
TRIGLYCERIDES: 127 mg/dL (ref 0–149)
VLDL CHOLESTEROL CAL: 25 mg/dL (ref 5–40)

## 2016-09-27 LAB — CMP14+EGFR
ALBUMIN: 4.2 g/dL (ref 3.5–4.8)
ALT: 20 IU/L (ref 0–44)
AST: 25 IU/L (ref 0–40)
Albumin/Globulin Ratio: 1.9 (ref 1.2–2.2)
Alkaline Phosphatase: 55 IU/L (ref 39–117)
BUN / CREAT RATIO: 10 (ref 10–24)
BUN: 13 mg/dL (ref 8–27)
Bilirubin Total: <0.2 mg/dL (ref 0.0–1.2)
CALCIUM: 9.4 mg/dL (ref 8.6–10.2)
CO2: 22 mmol/L (ref 20–29)
CREATININE: 1.27 mg/dL (ref 0.76–1.27)
Chloride: 105 mmol/L (ref 96–106)
GFR, EST AFRICAN AMERICAN: 62 mL/min/{1.73_m2} (ref >59)
GFR, EST NON AFRICAN AMERICAN: 54 mL/min/{1.73_m2} — AB (ref >59)
GLUCOSE: 108 mg/dL — AB (ref 65–99)
Globulin, Total: 2.2 g/dL (ref 1.5–4.5)
Potassium: 5.2 mmol/L (ref 3.5–5.2)
Sodium: 140 mmol/L (ref 134–144)
TOTAL PROTEIN: 6.4 g/dL (ref 6.0–8.5)

## 2016-09-27 LAB — CBC WITH DIFFERENTIAL/PLATELET
BASOS ABS: 0.1 10*3/uL (ref 0.0–0.2)
Basos: 1 %
EOS (ABSOLUTE): 0.5 10*3/uL — ABNORMAL HIGH (ref 0.0–0.4)
EOS: 8 %
HEMATOCRIT: 34.3 % — AB (ref 37.5–51.0)
HEMOGLOBIN: 11.1 g/dL — AB (ref 13.0–17.7)
IMMATURE GRANS (ABS): 0 10*3/uL (ref 0.0–0.1)
Immature Granulocytes: 1 %
LYMPHS ABS: 2.3 10*3/uL (ref 0.7–3.1)
LYMPHS: 37 %
MCH: 27.6 pg (ref 26.6–33.0)
MCHC: 32.4 g/dL (ref 31.5–35.7)
MCV: 85 fL (ref 79–97)
MONOCYTES: 7 %
Monocytes Absolute: 0.4 10*3/uL (ref 0.1–0.9)
NEUTROS ABS: 2.7 10*3/uL (ref 1.4–7.0)
Neutrophils: 46 %
Platelets: 240 10*3/uL (ref 150–379)
RBC: 4.02 x10E6/uL — ABNORMAL LOW (ref 4.14–5.80)
RDW: 14.9 % (ref 12.3–15.4)
WBC: 6 10*3/uL (ref 3.4–10.8)

## 2016-11-25 ENCOUNTER — Other Ambulatory Visit: Payer: Self-pay | Admitting: Family Medicine

## 2017-01-10 ENCOUNTER — Ambulatory Visit (INDEPENDENT_AMBULATORY_CARE_PROVIDER_SITE_OTHER): Payer: Medicare Other

## 2017-01-10 DIAGNOSIS — Z23 Encounter for immunization: Secondary | ICD-10-CM

## 2017-01-17 ENCOUNTER — Other Ambulatory Visit: Payer: Self-pay | Admitting: *Deleted

## 2017-01-17 DIAGNOSIS — N3281 Overactive bladder: Secondary | ICD-10-CM

## 2017-01-17 MED ORDER — MIRABEGRON ER 50 MG PO TB24: 50.0000 mg | ORAL_TABLET | Freq: Every day | ORAL | 0 refills | Status: DC

## 2017-01-17 NOTE — Telephone Encounter (Signed)
[facility] requesting refill on Ezetimibe 10 mg tabs #90 Medication is not on current med list Please advise

## 2017-01-18 NOTE — Telephone Encounter (Signed)
Call patient to verify but I am pretty sure we discontinued this medication.  If he is still taking it or his heart doctor wants him to still take it then go ahead and send the prescription refill

## 2017-01-18 NOTE — Telephone Encounter (Signed)
Per pt and cardiology, Zetia was d/ced  Pt currently takes Repatha

## 2017-01-22 ENCOUNTER — Other Ambulatory Visit: Payer: Self-pay | Admitting: Family Medicine

## 2017-01-24 ENCOUNTER — Encounter: Payer: Self-pay | Admitting: *Deleted

## 2017-01-24 ENCOUNTER — Ambulatory Visit (INDEPENDENT_AMBULATORY_CARE_PROVIDER_SITE_OTHER): Payer: Medicare Other | Admitting: *Deleted

## 2017-01-24 ENCOUNTER — Ambulatory Visit: Payer: Medicare Other | Admitting: *Deleted

## 2017-01-24 VITALS — BP 127/68 | HR 71 | Ht 69.0 in | Wt 188.0 lb

## 2017-01-24 DIAGNOSIS — Z0000 Encounter for general adult medical examination without abnormal findings: Secondary | ICD-10-CM

## 2017-01-24 NOTE — Patient Instructions (Signed)
  [name] , Thank you for taking time to come for your Medicare Wellness Visit. I appreciate your ongoing commitment to your health goals. Please review the following plan we discussed and let me know if I can assist you in the future.   These are the goals we discussed: Exercise for 150 min a week  This is a list of the screening recommended for you and due dates:  Health Maintenance  Topic Date Due  . Tetanus Vaccine  01/12/1957  . Pneumonia vaccines (1 of 2 - PCV13) 01/13/2003  . Colon Cancer Screening  04/12/2017  . Flu Shot  Completed

## 2017-01-24 NOTE — Progress Notes (Signed)
Subjective:   [name] is a 81 y.o. male who presents for an Initial Medicare Annual Wellness Visit. [name] lives at home with his wife. They have 2 adult daughters as well as grandchildren and 4 great grandsons. He still works part time and the hours can vary anywhere up to 40 hours a week. He is in Nurse, children's.  Review of Systems  Health is about the same a last year.  Cardiac Risk Factors include: advanced age (>23men, >66 women);hypertension;male gender  Other systems negative.    Objective:    Today's Vitals   01/24/17 1521  BP: 127/68  Pulse: 71  Weight: 188 lb (85.3 kg)  Height: 5\' 9"  (1.753 m)   Body mass index is 27.76 kg/m.  Current Medications (verified) Outpatient Encounter Medications as of 01/24/2017  Medication Sig  . albuterol (PROVENTIL HFA;VENTOLIN HFA) 108 (90 Base) MCG/ACT inhaler Inhale 2 puffs into the lungs every 6 (six) hours as needed for wheezing or shortness of breath.  . ALTACE 10 MG capsule TAKE 1 CAPSULE DAILY  . aspirin 81 MG tablet Take 81 mg by mouth 2 (two) times daily.   . cetirizine (ZYRTEC) 10 MG tablet TAKE 1 TABLET DAILY  . Coenzyme Q10 (COQ10) 100 MG CAPS Take 200 mg by mouth daily.  . Evolocumab (REPATHA SURECLICK) 998 MG/ML SOAJ Inject 140 mg into the skin every 14 (fourteen) days.  . fenofibrate micronized (LOFIBRA) 134 MG capsule Take 1 capsule (134 mg total) by mouth daily before breakfast.  . magnesium gluconate (MAGONATE) 500 MG tablet Take 500 mg by mouth 2 (two) times daily.  . mirabegron ER (MYRBETRIQ) 50 MG TB24 tablet Take 1 tablet (50 mg total) by mouth daily.  Marland Kitchen omeprazole (PRILOSEC) 20 MG capsule TAKE 1 CAPSULE DAILY  . potassium gluconate 595 (99 K) MG TABS tablet Take 595 mg by mouth daily.  Marland Kitchen umeclidinium bromide (INCRUSE ELLIPTA) 62.5 MCG/INH AEPB Inhale 1 puff into the lungs daily.  . Vitamin D, Ergocalciferol, (DRISDOL) 50000 units CAPS capsule TAKE 1 CAPSULE WEEKLY   No facility-administered  encounter medications on file as of 01/24/2017.     Allergies (verified) Patient has no known allergies.   History: Past Medical History:  Diagnosis Date  . Adenomatous colon polyp 11/1999  . Blood transfusion without reported diagnosis   . Cancer [facility])    Prostate  . Carotid artery occlusion   . Cataract   . Hyperlipidemia   . Hypertension   . Internal hemorrhoids    Past Surgical History:  Procedure Laterality Date  . APPENDECTOMY    . carotid surgery    . CATARACT EXTRACTION     both eyes  . COLONOSCOPY    . TRANSURETHRAL RESECTION OF PROSTATE    . UPPER GI ENDOSCOPY  Nov. 3, 2014   Upper endoscopy with diatation   Family History  Problem Relation Age of Onset  . Colon cancer Mother   . Cancer Mother        colon  . Heart disease Father        Heart Disease before age 38  . Heart attack Father 78  . Arthritis Sister   . Esophageal cancer Neg Hx   . Rectal cancer Neg Hx   . Stomach cancer Neg Hx    Social History   Occupational History  . Not on file  Tobacco Use  . Smoking status: Former Smoker    Years: 40.00    Types: Cigarettes    Last  attempt to quit: 01/19/1997    Years since quitting: 20.0  . Smokeless tobacco: Never Used  Substance and Sexual Activity  . Alcohol use: No  . Drug use: No  . Sexual activity: Not on file   Tobacco Counseling No tobacco use   Activities of Daily Living In your present state of health, do you have any difficulty performing the following activities: 01/24/2017  Hearing? Y  Comment has noticed some decline in hearing but is not ready to do anything about it yet. 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 N  Difficulty concentrating or making decisions? N  Walking or climbing stairs? N  Dressing or bathing? N  Doing errands, shopping? N  Preparing Food and eating ? N  Using the Toilet? N  In the past six months, have you accidently leaked urine? N  Do you have problems with loss of bowel control? N    Managing your Medications? N  Managing your Finances? N  Housekeeping or managing your Housekeeping? N  Some recent data might be hidden    Immunizations and Health Maintenance Immunization History  Administered Date(s) Administered  . Influenza, High Dose Seasonal PF 01/10/2017  . Influenza,inj,Quad PF,6+ Mos 12/17/2015   Health Maintenance Due  Topic Date Due  . TETANUS/TDAP  01/12/1957  . PNA vac Low Risk Adult (1 of 2 - PCV13) 01/13/2003    Patient Care Team: Dettinger, Fransisca Kaufmann, MD as PCP - General (Family Medicine) Minus Breeding, MD as Consulting Physician (Cardiology) Venetia Night, OD-Optometry  No hospitalizations, ER visits, or surgeries this past year.     Assessment:   This is a routine wellness examination for [name].   Hearing/Vision screen No deficits noted during visit. Eye exam is up to date   Dietary issues and exercise activities discussed: Current Exercise Habits: The patient does not participate in regular exercise at present, Exercise limited by: None identified  Goals Exercise for 150 minutes a week  Depression Screen PHQ 2/9 Scores 01/24/2017 09/26/2016 06/23/2016 05/10/2016  PHQ - 2 Score 0 0 0 0    Fall Risk Fall Risk  01/24/2017 09/26/2016 06/23/2016 05/10/2016 04/20/2016  Falls in the past year? No No No No No    Cognitive Function: MMSE - Mini Mental State Exam 01/24/2017  Orientation to time 5  Orientation to Place 5  Registration 3  Attention/ Calculation 5  Recall 2  Language- name 2 objects 2  Language- repeat 1  Language- follow 3 step command 3  Language- read & follow direction 1  Write a sentence 1  Copy design 1  Total score 29    normal    Screening Tests Health Maintenance  Topic Date Due  . TETANUS/TDAP  01/12/1957  . PNA vac Low Risk Adult (1 of 2 - PCV13) 01/13/2003  . COLONOSCOPY  04/12/2017  . INFLUENZA VACCINE  Completed   Had both pneumonia shots when seeing Dr Melina Copa. I will need to find those records.       Plan:  Exercise-aim for at least 150 min of moderate activity weekly Keep f/u with Dr Dettinger Recommended Tdap  I have personally reviewed and noted the following in the patient's chart:   . Medical and social history . Use of alcohol, tobacco or illicit drugs  . Current medications and supplements . Functional ability and status . Nutritional status . Physical activity . Advanced directives . List of other physicians . Hospitalizations, surgeries, and ER visits in previous 12 months .  Vitals . Screenings to include cognitive, depression, and falls . Referrals and appointments  In addition, I have reviewed and discussed with patient certain preventive protocols, quality metrics, and best practice recommendations. A written personalized care plan for preventive services as well as general preventive health recommendations were provided to patient.     Chong Sicilian, RN   01/24/2017     I have reviewed and agree with the above AWV documentation.   Evelina Dun, FNP

## 2017-01-31 ENCOUNTER — Other Ambulatory Visit: Payer: Self-pay | Admitting: Family Medicine

## 2017-02-02 ENCOUNTER — Ambulatory Visit (INDEPENDENT_AMBULATORY_CARE_PROVIDER_SITE_OTHER): Payer: Medicare Other | Admitting: Pediatrics

## 2017-02-02 ENCOUNTER — Encounter: Payer: Self-pay | Admitting: Pediatrics

## 2017-02-02 VITALS — BP 137/62 | HR 73 | Temp 96.9°F | Ht 69.0 in | Wt 171.0 lb

## 2017-02-02 DIAGNOSIS — S86302A Unspecified injury of muscle(s) and tendon(s) of peroneal muscle group at lower leg level, left leg, initial encounter: Secondary | ICD-10-CM

## 2017-02-02 DIAGNOSIS — I25119 Atherosclerotic heart disease of native coronary artery with unspecified angina pectoris: Secondary | ICD-10-CM

## 2017-02-02 NOTE — Progress Notes (Signed)
  Subjective:   Patient ID: [name], male    DOB: 04-07-1935, 81 y.o.   MRN: [id number] CC: Ankle Pain (Left, Worsened throughout the day, No injury)  HPI: [name] is a 81 y.o. male presenting for Ankle Pain (Left, Worsened throughout the day, No injury)  Woke up this morning with pain in his lateral left ankle Twisted his left foot slightly getting up from the floor after working yesterday No other injuries that he knows of Today has no pain unless he is standing and/or walking on the left foot Was not able to walk across the target this morning due to pain in the left ankle which is not normal for him Has taken Tylenol this morning which is helped some No redness, no swelling in either foot  Relevant past medical, surgical, family and social history reviewed. Allergies and medications reviewed and updated. Social History   Tobacco Use  Smoking Status Former Smoker  . Years: 40.00  . Types: Cigarettes  . Last attempt to quit: 01/19/1997  . Years since quitting: 20.0  Smokeless Tobacco Never Used   ROS: Per HPI   Objective:    BP 137/62   Pulse 73   Temp (!) 96.9 F (36.1 C) (Oral)   Ht 5\' 9"  (1.753 m)   Wt 171 lb (77.6 kg)   BMI 25.25 kg/m   Wt Readings from Last 3 Encounters:  02/02/17 171 lb (77.6 kg)  01/24/17 188 lb (85.3 kg)  09/26/16 184 lb (83.5 kg)    Gen: NAD, alert, cooperative with exam, NCAT EYES: EOMI, no conjunctival injection, or no icterus CV: WWP, DP pulses 2+ b/l Resp:  normal WOB Ext: Trace pitting edema present bilateral LE to mid shin Neuro: Alert and oriented, strength equal b/l ankle flex/ext, coordination grossly normal MSK: ttp over L peroneal prox tendon/muscle No pain with inversion/eversion L ankle  Assessment & Plan:  [name] was seen today for ankle pain.  Diagnoses and all orders for this visit:  Injury of peroneal tendon of left foot, initial encounter Rest, ice, topical Aspercreme as needed Return precautions  discussed  Follow up plan: Return if symptoms worsen or fail to improve. Assunta Found, MD [place]

## 2017-02-02 NOTE — Patient Instructions (Signed)
Rest, ice as needed Continue tylenol as needed Can try aspercream a couple times a day to sore area

## 2017-03-13 ENCOUNTER — Other Ambulatory Visit: Payer: Self-pay | Admitting: Family Medicine

## 2017-03-24 ENCOUNTER — Other Ambulatory Visit: Payer: Self-pay | Admitting: Cardiology

## 2017-03-27 ENCOUNTER — Encounter: Payer: Self-pay | Admitting: Gastroenterology

## 2017-03-29 ENCOUNTER — Encounter: Payer: Self-pay | Admitting: Family Medicine

## 2017-03-29 ENCOUNTER — Ambulatory Visit (INDEPENDENT_AMBULATORY_CARE_PROVIDER_SITE_OTHER): Payer: Medicare Other | Admitting: Family Medicine

## 2017-03-29 VITALS — BP 134/78 | HR 67 | Temp 97.6°F | Ht 69.0 in | Wt 189.0 lb

## 2017-03-29 DIAGNOSIS — G4700 Insomnia, unspecified: Secondary | ICD-10-CM

## 2017-03-29 DIAGNOSIS — E785 Hyperlipidemia, unspecified: Secondary | ICD-10-CM

## 2017-03-29 DIAGNOSIS — J439 Emphysema, unspecified: Secondary | ICD-10-CM

## 2017-03-29 DIAGNOSIS — D649 Anemia, unspecified: Secondary | ICD-10-CM

## 2017-03-29 DIAGNOSIS — I10 Essential (primary) hypertension: Secondary | ICD-10-CM | POA: Diagnosis not present

## 2017-03-29 DIAGNOSIS — R7309 Other abnormal glucose: Secondary | ICD-10-CM | POA: Diagnosis not present

## 2017-03-29 MED ORDER — TRAZODONE HCL 50 MG PO TABS: 25.0000 mg | ORAL_TABLET | Freq: Every evening | ORAL | 3 refills | Status: DC | PRN

## 2017-03-29 NOTE — Progress Notes (Signed)
BP 134/78   Pulse 67   Temp 97.6 F (36.4 C) (Oral)   Ht '5\' 9"'  (1.753 m)   Wt 189 lb (85.7 kg)   BMI 27.91 kg/m    Subjective:    Patient ID: [name], male    DOB: 09-14-1935, 82 y.o.   MRN: [id number]  HPI: [name] is a 82 y.o. male presenting on 03/29/2017 for Hyperlipidemia (6 mo) and Hypertension   HPI Hyperlipidemia Patient is coming in for recheck of his hyperlipidemia. The patient is currently taking Repatha and Zetia. They deny any issues with myalgias or history of liver damage from it. They deny any focal numbness or weakness or chest pain.   Hypertension Patient is currently on Altace, and their blood pressure today is 134/78. Patient denies any lightheadedness or dizziness. Patient denies headaches, blurred vision, chest pains, shortness of breath, or weakness. Denies any side effects from medication and is content with current medication.   COPD Patient is coming in for COPD recheck.  He says he is starting to gradually feel more short of breath when he is trying to do physical activities.  He denies any coughing spells or wheezing spells or any exacerbations but just says it is gradually been worsening with his breathing and feeling more tightness with his chest on physical activity.  He denies any shortness of breath with laying flat or sleeping.  He denies any fevers or chills or cough or congestion or wheezing currently.  Anemia Patient has a history of mild anemia and we are monitoring and will today and see where its at.  He denies any bleeding episodes or lightheadedness or dizziness.  Insomnia Patient says has been having difficulty with sleeping, he can fall asleep fine but he will wake up multiple times either 3 or 4 times a night.  Sometimes he has a little bit of trouble getting back to sleep when he wakes up.  He does not know what wakes him up.  He denies it being urinary issues or feeling short of breath or gasping for air when he wakes up.   He denies snoring but will ask his wife more about that.  He denies any restlessness or abnormalities of legs.  Relevant past medical, surgical, family and social history reviewed and updated as indicated. Interim medical history since our last visit reviewed. Allergies and medications reviewed and updated.  Review of Systems  Constitutional: Negative for chills and fever.  HENT: Negative for congestion, rhinorrhea, sinus pressure, sinus pain, sneezing and sore throat.   Eyes: Negative for discharge.  Respiratory: Positive for shortness of breath. Negative for cough and wheezing.   Cardiovascular: Negative for chest pain and leg swelling.  Musculoskeletal: Negative for back pain and gait problem.  Skin: Negative for rash.  All other systems reviewed and are negative.   Per HPI unless specifically indicated above     Objective:    BP 134/78   Pulse 67   Temp 97.6 F (36.4 C) (Oral)   Ht '5\' 9"'  (1.753 m)   Wt 189 lb (85.7 kg)   BMI 27.91 kg/m   Wt Readings from Last 3 Encounters:  03/29/17 189 lb (85.7 kg)  02/02/17 171 lb (77.6 kg)  01/24/17 188 lb (85.3 kg)    Physical Exam  Constitutional: He is oriented to person, place, and time. He appears well-developed and well-nourished. No distress.  Eyes: Conjunctivae are normal. No scleral icterus.  Neck: Neck supple. No thyromegaly present.  Cardiovascular: Normal  rate, regular rhythm, normal heart sounds and intact distal pulses.  No murmur heard. Pulmonary/Chest: Effort normal and breath sounds normal. No respiratory distress. He has no wheezes. He has no rales.  Musculoskeletal: Normal range of motion. He exhibits no edema.  Lymphadenopathy:    He has no cervical adenopathy.  Neurological: He is alert and oriented to person, place, and time. Coordination normal.  Skin: Skin is warm and dry. No rash noted. He is not diaphoretic.  Psychiatric: He has a normal mood and affect. His behavior is normal.  Nursing note and vitals  reviewed.      Assessment & Plan:   Problem List Items Addressed This Visit      Cardiovascular and Mediastinum   HTN (hypertension), benign - Primary   Relevant Orders   CMP14+EGFR     Respiratory   COPD (chronic obstructive pulmonary disease) ([place])     Other   Hyperlipidemia LDL goal <100   Relevant Orders   Lipid panel    Other Visit Diagnoses    Anemia, unspecified type       Relevant Orders   CBC with Differential/Platelet   Insomnia, unspecified type       Relevant Medications   traZODone (DESYREL) 50 MG tablet       Follow up plan: Return in about 6 months (around 09/26/2017), or if symptoms worsen or fail to improve, for Hyperlipidemia hypertension.  Counseling provided for all of the vaccine components Orders Placed This Encounter  Procedures  . CMP14+EGFR  . Lipid panel  . CBC with Differential/Platelet    Caryl Pina, MD [facility] 03/29/2017, 8:34 AM

## 2017-03-30 ENCOUNTER — Telehealth: Payer: Self-pay | Admitting: Family Medicine

## 2017-03-30 DIAGNOSIS — G4700 Insomnia, unspecified: Secondary | ICD-10-CM

## 2017-03-30 LAB — CMP14+EGFR
A/G RATIO: 2 (ref 1.2–2.2)
ALBUMIN: 4.3 g/dL (ref 3.5–4.8)
ALT: 12 IU/L (ref 0–44)
AST: 20 IU/L (ref 0–40)
Alkaline Phosphatase: 62 IU/L (ref 39–117)
BILIRUBIN TOTAL: 0.2 mg/dL (ref 0.0–1.2)
BUN / CREAT RATIO: 9 — AB (ref 10–24)
BUN: 11 mg/dL (ref 8–27)
CHLORIDE: 104 mmol/L (ref 96–106)
CO2: 22 mmol/L (ref 20–29)
Calcium: 9.4 mg/dL (ref 8.6–10.2)
Creatinine, Ser: 1.25 mg/dL (ref 0.76–1.27)
GFR, EST AFRICAN AMERICAN: 63 mL/min/{1.73_m2} (ref >59)
GFR, EST NON AFRICAN AMERICAN: 54 mL/min/{1.73_m2} — AB (ref >59)
GLOBULIN, TOTAL: 2.2 g/dL (ref 1.5–4.5)
Glucose: 118 mg/dL — ABNORMAL HIGH (ref 65–99)
POTASSIUM: 4.4 mmol/L (ref 3.5–5.2)
SODIUM: 139 mmol/L (ref 134–144)
Total Protein: 6.5 g/dL (ref 6.0–8.5)

## 2017-03-30 LAB — CBC WITH DIFFERENTIAL/PLATELET
BASOS ABS: 0.1 10*3/uL (ref 0.0–0.2)
Basos: 1 %
EOS (ABSOLUTE): 0.4 10*3/uL (ref 0.0–0.4)
Eos: 7 %
HEMATOCRIT: 34.4 % — AB (ref 37.5–51.0)
Hemoglobin: 10.8 g/dL — ABNORMAL LOW (ref 13.0–17.7)
Immature Grans (Abs): 0 10*3/uL (ref 0.0–0.1)
Immature Granulocytes: 1 %
Lymphocytes Absolute: 2.1 10*3/uL (ref 0.7–3.1)
Lymphs: 33 %
MCH: 27 pg (ref 26.6–33.0)
MCHC: 31.4 g/dL — AB (ref 31.5–35.7)
MCV: 86 fL (ref 79–97)
MONOS ABS: 0.5 10*3/uL (ref 0.1–0.9)
Monocytes: 8 %
Neutrophils Absolute: 3.3 10*3/uL (ref 1.4–7.0)
Neutrophils: 50 %
Platelets: 264 10*3/uL (ref 150–379)
RBC: 4 x10E6/uL — AB (ref 4.14–5.80)
RDW: 15.4 % (ref 12.3–15.4)
WBC: 6.5 10*3/uL (ref 3.4–10.8)

## 2017-03-30 LAB — LIPID PANEL
CHOL/HDL RATIO: 2.4 ratio (ref 0.0–5.0)
Cholesterol, Total: 83 mg/dL — ABNORMAL LOW (ref 100–199)
HDL: 35 mg/dL — AB (ref >39)
LDL CALC: 21 mg/dL (ref 0–99)
Triglycerides: 133 mg/dL (ref 0–149)
VLDL CHOLESTEROL CAL: 27 mg/dL (ref 5–40)

## 2017-03-30 MED ORDER — TRAZODONE HCL 50 MG PO TABS: 25.0000 mg | ORAL_TABLET | Freq: Every evening | ORAL | 3 refills | Status: DC | PRN

## 2017-03-30 NOTE — Telephone Encounter (Signed)
Spoke to the pharmacist at [facility] and they said the order was too far along to cancel but the pharmacist at [facility] could call ([phone]) and have it backed out. I spoke with [facility] and relayed information including above number and sent rx to [facility] and they will call to get order backed out. Pt is aware.

## 2017-04-01 LAB — HGB A1C W/O EAG: Hgb A1c MFr Bld: 6 % — ABNORMAL HIGH (ref 4.8–5.6)

## 2017-04-01 LAB — SPECIMEN STATUS REPORT

## 2017-04-17 ENCOUNTER — Other Ambulatory Visit: Payer: Self-pay | Admitting: Family Medicine

## 2017-04-17 DIAGNOSIS — N3281 Overactive bladder: Secondary | ICD-10-CM

## 2017-04-23 ENCOUNTER — Other Ambulatory Visit: Payer: Self-pay | Admitting: Family Medicine

## 2017-04-24 ENCOUNTER — Other Ambulatory Visit: Payer: Self-pay | Admitting: Family Medicine

## 2017-04-26 ENCOUNTER — Ambulatory Visit (AMBULATORY_SURGERY_CENTER): Payer: Self-pay | Admitting: *Deleted

## 2017-04-26 ENCOUNTER — Other Ambulatory Visit: Payer: Self-pay

## 2017-04-26 VITALS — Ht 70.0 in | Wt 191.0 lb

## 2017-04-26 DIAGNOSIS — Z86010 Personal history of colonic polyps: Secondary | ICD-10-CM

## 2017-04-26 DIAGNOSIS — Z800 Family history of malignant neoplasm of digestive organs: Secondary | ICD-10-CM

## 2017-04-26 MED ORDER — NA SULFATE-K SULFATE-MG SULF 17.5-3.13-1.6 GM/177ML PO SOLN: 1.0000 | Freq: Once | ORAL | 0 refills | Status: AC

## 2017-04-26 NOTE — Progress Notes (Signed)
No egg or soy allergy known to patient  No issues with past sedation with any surgeries  or procedures, no intubation problems  No diet pills per patient No home 02 use per patient  No blood thinners per patient  Pt denies issues with constipation  No A fib or A flutter  EMMI video sent to pt's e mail - pt declined e mail

## 2017-05-01 ENCOUNTER — Other Ambulatory Visit: Payer: Self-pay | Admitting: Family Medicine

## 2017-05-02 NOTE — Progress Notes (Signed)
Cardiology Office Note   Date:  05/03/2017   ID:  [name], DOB 07-13-1935, MRN [id number]  PCP:  Dettinger, Fransisca Kaufmann, MD  Cardiologist:   Minus Breeding, MD  Referring:  Dettinger, Fransisca Kaufmann, MD   Chief Complaint  Patient presents with  . PVCs     History of Present Illness: [name] is a 82 y.o. male who presents for evaluation of dyslipidemia.  He's had this for some time and has been intolerant of all statins. He gets diffuse muscle aches. He does have peripheral vascular disease.  He previously saw Dr. Mare Ferrari once in 2015 for bradycardia.  He had NSR with PVCs/bigeminy.  He apparently did no perfuse the PVCs.  He has had CEA in the past.   He's had otherwise no significant past cardiac history. He's had negative stress testing some years ago.  He did have an essentially normal echo in 2017. I saw him last year.    Since I last saw him he has done well.  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  The patient denies any new symptoms such as chest discomfort, neck or arm discomfort. There has been no new shortness of breath, PND or orthopnea. There have been no reported palpitations, presyncope or syncope.  He does not notices his PVCs.    Past Medical History:  Diagnosis Date  . Adenomatous colon polyp 11/1999  . Allergy   . Blood transfusion without reported diagnosis   . Cancer [facility])    Prostate  . Carotid artery occlusion   . Cataract   . COPD (chronic obstructive pulmonary disease) ([place])   . GERD (gastroesophageal reflux disease)   . Hyperlipidemia   . Hypertension   . Internal hemorrhoids   . PVC (premature ventricular contraction)     Past Surgical History:  Procedure Laterality Date  . APPENDECTOMY    . carotid surgery    . CATARACT EXTRACTION     both eyes  . COLONOSCOPY    . POLYPECTOMY    . TRANSURETHRAL RESECTION OF PROSTATE    . UPPER GASTROINTESTINAL  ENDOSCOPY    . UPPER GI ENDOSCOPY  Nov. 3, 2014   Upper endoscopy with diatation     Current Outpatient Medications  Medication Sig Dispense Refill  . albuterol (PROVENTIL HFA;VENTOLIN HFA) 108 (90 Base) MCG/ACT inhaler Inhale 2 puffs into the lungs every 6 (six) hours as needed for wheezing or shortness of breath. 3 Inhaler 3  . aspirin 81 MG tablet Take 81 mg by mouth 2 (two) times daily.     . cetirizine (ZYRTEC) 10 MG tablet TAKE 1 TABLET DAILY 90 tablet 2  . Evolocumab (REPATHA SURECLICK) 338 MG/ML SOAJ Inject 140 mg into the skin every 14 (fourteen) days. 6 mL 11  . fenofibrate micronized (LOFIBRA) 134 MG capsule TAKE 1 CAPSULE DAILY BEFORE BREAKFAST 90 capsule 0  . MYRBETRIQ 50 MG TB24 tablet TAKE 1 TABLET DAILY 90 tablet 1  . omeprazole (PRILOSEC) 20 MG capsule TAKE 1 CAPSULE DAILY 90 capsule 0  . ramipril (ALTACE) 10 MG capsule TAKE 1 CAPSULE DAILY 90 capsule 1  . umeclidinium bromide (INCRUSE ELLIPTA) 62.5 MCG/INH AEPB Inhale 1 puff into the lungs daily. 90 each 0  . Vitamin D, Ergocalciferol, (DRISDOL) 50000 units CAPS capsule TAKE 1 CAPSULE WEEKLY 12 capsule 3   No current facility-administered medications for this visit.  Allergies:   Lamisil [terbinafine hcl]    ROS:  Please see the history of present illness.   Otherwise, review of systems are positive for none.   All other systems are reviewed and negative.    PHYSICAL EXAM: VS:  BP 136/75   Pulse 77   Ht 5\' 10"  (1.778 m)   Wt 193 lb (87.5 kg)   BMI 27.69 kg/m  , BMI Body mass index is 27.69 kg/m.  GENERAL:  Well appearing NECK:  No jugular venous distention, waveform within normal limits, carotid upstroke brisk and symmetric, no bruits, no thyromegaly LUNGS:  Clear to auscultation bilaterally CHEST:  Unremarkable HEART:  PMI not displaced or sustained,S1 and S2 within normal limits, no S3, no S4, no clicks, no rubs, no murmurs ABD:  Flat, positive bowel sounds normal in frequency in pitch, no bruits, no  rebound, no guarding, no midline pulsatile mass, no hepatomegaly, no splenomegaly EXT:  2 plus pulses throughout, no edema, no cyanosis no clubbing    EKG:  EKG is  ordered today. The ekg ordered 04/20/16 demonstrates sinus rhythm, rate 77, low voltage in the limb leads, axis within normal limits, intervals within normal limits, premature ventricular contraction.  PVCs.    Recent Labs: 03/29/2017: ALT 12; BUN 11; Creatinine, Ser 1.25; Hemoglobin 10.8; Platelets 264; Potassium 4.4; Sodium 139    Lipid Panel    Component Value Date/Time   CHOL 83 (L) 03/29/2017 0839   TRIG 133 03/29/2017 0839   HDL 35 (L) 03/29/2017 0839   CHOLHDL 2.4 03/29/2017 0839   LDLCALC 21 03/29/2017 0839      Wt Readings from Last 3 Encounters:  05/03/17 193 lb (87.5 kg)  04/26/17 191 lb (86.6 kg)  03/29/17 189 lb (85.7 kg)      Other studies Reviewed: Additional studies/ records that were reviewed today include: Labs Review of the above records demonstrates:     ASSESSMENT AND PLAN:  CAROTID STENOSIS:  He is up to date with follow up and has an appt with [facility].   DYSLIPIDEMIA:  He has been intolerant of statins. He has done very well with Repatha and will continue with this.   HTN:  The blood pressure is at target. No change in medications is indicated. We will continue with therapeutic lifestyle changes (TLC).  PVCs:  He is not bothered by these.  No change in therapy or further testing is indicated.   Current medicines are reviewed at length with the patient today.  The patient does not have concerns regarding medicines.  The following changes have been made:  None  Labs/ tests ordered today include: None  Orders Placed This Encounter  Procedures  . EKG 12-Lead     Disposition:   FU with me in one year.    Signed, Minus Breeding, MD  05/03/2017 2:09 PM    [place]

## 2017-05-03 ENCOUNTER — Ambulatory Visit (INDEPENDENT_AMBULATORY_CARE_PROVIDER_SITE_OTHER): Payer: Medicare Other | Admitting: Cardiology

## 2017-05-03 ENCOUNTER — Encounter: Payer: Self-pay | Admitting: Cardiology

## 2017-05-03 VITALS — BP 136/75 | HR 77 | Ht 70.0 in | Wt 193.0 lb

## 2017-05-03 DIAGNOSIS — I493 Ventricular premature depolarization: Secondary | ICD-10-CM

## 2017-05-03 DIAGNOSIS — I10 Essential (primary) hypertension: Secondary | ICD-10-CM | POA: Diagnosis not present

## 2017-05-03 DIAGNOSIS — E785 Hyperlipidemia, unspecified: Secondary | ICD-10-CM

## 2017-05-03 MED ORDER — EVOLOCUMAB 140 MG/ML ~~LOC~~ SOAJ: 140.0000 mg | SUBCUTANEOUS | 11 refills | Status: DC

## 2017-05-03 NOTE — Patient Instructions (Signed)

## 2017-05-09 ENCOUNTER — Telehealth: Payer: Self-pay | Admitting: Gastroenterology

## 2017-05-09 NOTE — Telephone Encounter (Signed)
Do you want to charge? 

## 2017-05-09 NOTE — Telephone Encounter (Signed)
No charge. 

## 2017-05-10 ENCOUNTER — Encounter: Payer: Medicare Other | Admitting: Gastroenterology

## 2017-05-13 ENCOUNTER — Ambulatory Visit (INDEPENDENT_AMBULATORY_CARE_PROVIDER_SITE_OTHER): Payer: Medicare Other | Admitting: Physician Assistant

## 2017-05-13 ENCOUNTER — Encounter: Payer: Self-pay | Admitting: Physician Assistant

## 2017-05-13 VITALS — BP 151/85 | HR 81 | Temp 98.5°F | Resp 18 | Ht 70.0 in | Wt 189.6 lb

## 2017-05-13 DIAGNOSIS — J209 Acute bronchitis, unspecified: Secondary | ICD-10-CM | POA: Diagnosis not present

## 2017-05-13 DIAGNOSIS — J440 Chronic obstructive pulmonary disease with acute lower respiratory infection: Secondary | ICD-10-CM | POA: Diagnosis not present

## 2017-05-13 MED ORDER — GUAIFENESIN-CODEINE 100-10 MG/5ML PO SOLN: 5.0000 mL | Freq: Three times a day (TID) | ORAL | 0 refills | Status: DC | PRN

## 2017-05-13 MED ORDER — DOXYCYCLINE HYCLATE 100 MG PO TABS: 100.0000 mg | ORAL_TABLET | Freq: Two times a day (BID) | ORAL | 0 refills | Status: DC

## 2017-05-13 MED ORDER — METHYLPREDNISOLONE ACETATE 80 MG/ML IJ SUSP
80.0000 mg | Freq: Once | INTRAMUSCULAR | Status: AC
  Administered 2017-05-13: 80 mg via INTRAMUSCULAR

## 2017-05-13 MED ORDER — PSEUDOEPHEDRINE-CODEINE-GG 30-10-100 MG/5ML PO SOLN: 10.0000 mL | Freq: Four times a day (QID) | ORAL | 0 refills | Status: DC | PRN

## 2017-05-13 NOTE — Progress Notes (Signed)
BP (!) 151/85   Pulse 81   Temp 98.5 F (36.9 C) (Oral)   Resp 18   Ht 5\' 10"  (1.778 m)   Wt 189 lb 9.6 oz (86 kg)   SpO2 95%   BMI 27.20 kg/m    Subjective:    Patient ID: [name], male    DOB: 1935-06-21, 82 y.o.   MRN: [id number]  HPI: [name] is a 82 y.o. male presenting on 05/13/2017 for Cough and Trouble Sleeping  Patient with several days of progressing upper respiratory and bronchial symptoms. Initially there was more upper respiratory congestion. This progressed to having significant cough that is productive throughout the day and severe at night. There is occasional wheezing after coughing. Sometimes there is slight dyspnea on exertion. It is productive mucus that is yellow in color. Denies any blood.   Past Medical History:  Diagnosis Date  . Adenomatous colon polyp 11/1999  . Allergy   . Blood transfusion without reported diagnosis   . Cancer [facility])    Prostate  . Carotid artery occlusion   . Cataract   . COPD (chronic obstructive pulmonary disease) ([place])   . GERD (gastroesophageal reflux disease)   . Hyperlipidemia   . Hypertension   . Internal hemorrhoids   . PVC (premature ventricular contraction)    Relevant past medical, surgical, family and social history reviewed and updated as indicated. Interim medical history since our last visit reviewed. Allergies and medications reviewed and updated. DATA REVIEWED: CHART IN EPIC  Family History reviewed for pertinent findings.  Review of Systems  Constitutional: Positive for fatigue. Negative for appetite change.  HENT: Positive for sinus pressure and sore throat.   Eyes: Negative.  Negative for pain and visual disturbance.  Respiratory: Positive for shortness of breath and wheezing. Negative for cough and chest tightness.   Cardiovascular: Negative.  Negative for chest pain, palpitations and leg swelling.  Gastrointestinal: Negative.  Negative for abdominal pain, diarrhea, nausea and vomiting.   Endocrine: Negative.   Genitourinary: Negative.   Musculoskeletal: Positive for back pain and myalgias.  Skin: Negative.  Negative for color change and rash.  Neurological: Positive for headaches. Negative for weakness and numbness.  Psychiatric/Behavioral: Negative.     Allergies as of 05/13/2017      Reactions   Lamisil [terbinafine Hcl] Other (See Comments)   Loss of taste       Medication List        Accurate as of 05/13/17  9:43 AM. Always use your most recent med list.          albuterol 108 (90 Base) MCG/ACT inhaler Commonly known as:  PROVENTIL HFA;VENTOLIN HFA Inhale 2 puffs into the lungs every 6 (six) hours as needed for wheezing or shortness of breath.   aspirin 81 MG tablet Take 81 mg by mouth 2 (two) times daily.   cetirizine 10 MG tablet Commonly known as:  ZYRTEC TAKE 1 TABLET DAILY   doxycycline 100 MG tablet Commonly known as:  VIBRA-TABS Take 1 tablet (100 mg total) by mouth 2 (two) times daily.   Evolocumab 140 MG/ML Soaj Commonly known as:  REPATHA SURECLICK Inject 962 mg into the skin every 14 (fourteen) days.   fenofibrate micronized 134 MG capsule Commonly known as:  LOFIBRA TAKE 1 CAPSULE DAILY BEFORE BREAKFAST   guaiFENesin-codeine 100-10 MG/5ML syrup Take 5-10 mLs by mouth 3 (three) times daily as needed for cough.   MYRBETRIQ 50 MG Tb24 tablet Generic drug:  mirabegron ER TAKE  1 TABLET DAILY   omeprazole 20 MG capsule Commonly known as:  PRILOSEC TAKE 1 CAPSULE DAILY   ramipril 10 MG capsule Commonly known as:  ALTACE TAKE 1 CAPSULE DAILY   umeclidinium bromide 62.5 MCG/INH Aepb Commonly known as:  INCRUSE ELLIPTA Inhale 1 puff into the lungs daily.   Vitamin D (Ergocalciferol) 50000 units Caps capsule Commonly known as:  DRISDOL TAKE 1 CAPSULE WEEKLY          Objective:    BP (!) 151/85   Pulse 81   Temp 98.5 F (36.9 C) (Oral)   Resp 18   Ht 5\' 10"  (1.778 m)   Wt 189 lb 9.6 oz (86 kg)   SpO2 95%   BMI  27.20 kg/m   Allergies  Allergen Reactions  . Lamisil [Terbinafine Hcl] Other (See Comments)    Loss of taste     Wt Readings from Last 3 Encounters:  05/13/17 189 lb 9.6 oz (86 kg)  05/03/17 193 lb (87.5 kg)  04/26/17 191 lb (86.6 kg)    Physical Exam  Constitutional: He appears well-developed and well-nourished.  HENT:  Head: Normocephalic and atraumatic.  Right Ear: Hearing and tympanic membrane normal.  Left Ear: Hearing and tympanic membrane normal.  Nose: Mucosal edema and sinus tenderness present. No nasal deformity. Right sinus exhibits frontal sinus tenderness. Left sinus exhibits frontal sinus tenderness.  Mouth/Throat: Posterior oropharyngeal erythema present.  Eyes: Conjunctivae and EOM are normal. Pupils are equal, round, and reactive to light. Right eye exhibits no discharge. Left eye exhibits no discharge.  Neck: Normal range of motion. Neck supple.  Cardiovascular: Normal rate, regular rhythm and normal heart sounds.  Pulmonary/Chest: Effort normal. No respiratory distress. He has no decreased breath sounds. He has wheezes. He has no rhonchi. He has no rales.  Abdominal: Soft. Bowel sounds are normal.  Musculoskeletal: Normal range of motion.  Skin: Skin is warm and dry.        Assessment & Plan:   1. Acute bronchitis with COPD ([place]) - doxycycline (VIBRA-TABS) 100 MG tablet; Take 1 tablet (100 mg total) by mouth 2 (two) times daily.  Dispense: 20 tablet; Refill: 0 - methylPREDNISolone acetate (DEPO-MEDROL) injection 80 mg - guaiFENesin-codeine 100-10 MG/5ML syrup; Take 5-10 mLs by mouth 3 (three) times daily as needed for cough.  Dispense: 240 mL; Refill: 0   Continue all other maintenance medications as listed above.  Follow up plan: No Follow-up on file.  Educational handout given for [place] PA-C [place] [address]  [place], [place] [address] [phone]   05/13/2017, 9:43 AM

## 2017-06-07 ENCOUNTER — Ambulatory Visit: Payer: Medicare Other | Admitting: Cardiology

## 2017-06-11 ENCOUNTER — Other Ambulatory Visit: Payer: Self-pay | Admitting: Family Medicine

## 2017-06-12 ENCOUNTER — Encounter: Payer: Self-pay | Admitting: Family Medicine

## 2017-06-12 ENCOUNTER — Ambulatory Visit (INDEPENDENT_AMBULATORY_CARE_PROVIDER_SITE_OTHER): Payer: Medicare Other | Admitting: Family Medicine

## 2017-06-12 VITALS — BP 131/78 | HR 66 | Temp 97.0°F | Ht 70.0 in | Wt 187.0 lb

## 2017-06-12 DIAGNOSIS — M791 Myalgia, unspecified site: Secondary | ICD-10-CM

## 2017-06-12 DIAGNOSIS — R6889 Other general symptoms and signs: Secondary | ICD-10-CM | POA: Diagnosis not present

## 2017-06-12 MED ORDER — PREDNISONE 20 MG PO TABS: ORAL_TABLET | ORAL | 0 refills | Status: DC

## 2017-06-12 MED ORDER — BACLOFEN 10 MG PO TABS: 10.0000 mg | ORAL_TABLET | Freq: Three times a day (TID) | ORAL | 0 refills | Status: DC

## 2017-06-12 NOTE — Progress Notes (Signed)
BP 131/78   Pulse 66   Temp (!) 97 F (36.1 C) (Oral)   Ht 5\' 10"  (1.778 m)   Wt 187 lb (84.8 kg)   BMI 26.83 kg/m    Subjective:    Patient ID: [name], male    DOB: 1935-05-25, 82 y.o.   MRN: [id number]  HPI: [name] is a 82 y.o. male presenting on 06/12/2017 for Cramping in legs and fingers (x 6 days, hands happen during the day, legs during the night)   HPI Myalgias Patient has been having continued myalgias and cramping.  He says the cramping is in both of his lower legs but he gets some in his hands and his fingers.  He says this is been going on for over a year intermittently.  We initially thought it was from statins and he had an elevated CK we stopped the statins but now over the past few months the myalgias have been increasing again.  We have tested a lot of different lab values and have not found any clear answers and will test some more lab values and see where it goes from there.  We also discussed possibly going to rheumatology and seeing if they could have any further answers for [place].  He denies any urinary symptoms or abdominal pain or liver problems.  Relevant past medical, surgical, family and social history reviewed and updated as indicated. Interim medical history since our last visit reviewed. Allergies and medications reviewed and updated.  Review of Systems  Constitutional: Negative for chills and fever.  Respiratory: Negative for shortness of breath and wheezing.   Cardiovascular: Negative for chest pain and leg swelling.  Musculoskeletal: Positive for myalgias. Negative for back pain and gait problem.  Skin: Negative for rash.  All other systems reviewed and are negative.   Per HPI unless specifically indicated above   Allergies as of 06/12/2017      Reactions   Lamisil [terbinafine Hcl] Other (See Comments)   Loss of taste       Medication List        Accurate as of 06/12/17  4:54 PM. Always use your most recent med list.            albuterol 108 (90 Base) MCG/ACT inhaler Commonly known as:  PROVENTIL HFA;VENTOLIN HFA Inhale 2 puffs into the lungs every 6 (six) hours as needed for wheezing or shortness of breath.   aspirin 81 MG tablet Take 81 mg by mouth 2 (two) times daily.   baclofen 10 MG tablet Commonly known as:  LIORESAL Take 1 tablet (10 mg total) by mouth 3 (three) times daily.   cetirizine 10 MG tablet Commonly known as:  ZYRTEC TAKE 1 TABLET DAILY   Evolocumab 140 MG/ML Soaj Commonly known as:  REPATHA SURECLICK Inject 644 mg into the skin every 14 (fourteen) days.   MYRBETRIQ 50 MG Tb24 tablet Generic drug:  mirabegron ER TAKE 1 TABLET DAILY   omeprazole 20 MG capsule Commonly known as:  PRILOSEC TAKE 1 CAPSULE DAILY   predniSONE 20 MG tablet Commonly known as:  DELTASONE 2 po at same time daily for 5 days   ramipril 10 MG capsule Commonly known as:  ALTACE TAKE 1 CAPSULE DAILY   umeclidinium bromide 62.5 MCG/INH Aepb Commonly known as:  INCRUSE ELLIPTA Inhale 1 puff into the lungs daily.   Vitamin D (Ergocalciferol) 50000 units Caps capsule Commonly known as:  DRISDOL TAKE 1 CAPSULE WEEKLY  Objective:    BP 131/78   Pulse 66   Temp (!) 97 F (36.1 C) (Oral)   Ht 5\' 10"  (1.778 m)   Wt 187 lb (84.8 kg)   BMI 26.83 kg/m   Wt Readings from Last 3 Encounters:  06/12/17 187 lb (84.8 kg)  05/13/17 189 lb 9.6 oz (86 kg)  05/03/17 193 lb (87.5 kg)    Physical Exam  Constitutional: He is oriented to person, place, and time. He appears well-developed and well-nourished. No distress.  Eyes: Conjunctivae are normal. No scleral icterus.  Cardiovascular: Normal rate, regular rhythm, normal heart sounds and intact distal pulses.  No murmur heard. Pulmonary/Chest: Effort normal and breath sounds normal. No respiratory distress. He has no wheezes. He has no rales.  Musculoskeletal: Normal range of motion. He exhibits tenderness (Diffuse tenderness in arms and legs  and back musculature to palpation, nothing severe but diffuse mild tenderness). He exhibits no edema.  Neurological: He is alert and oriented to person, place, and time. Coordination normal.  Skin: Skin is warm and dry. No rash noted. He is not diaphoretic.  Psychiatric: He has a normal mood and affect. His behavior is normal.  Nursing note and vitals reviewed.       Assessment & Plan:   Problem List Items Addressed This Visit    None    Visit Diagnoses    Myalgia    -  Primary   Relevant Medications   baclofen (LIORESAL) 10 MG tablet   predniSONE (DELTASONE) 20 MG tablet   Other Relevant Orders   Ambulatory referral to Rheumatology   Thyroid Panel With TSH (Completed)   CBC with Differential/Platelet (Completed)   Sedimentation rate (Completed)   High sensitivity CRP (Completed)   Potassium (Completed)   Magnesium (Completed)   Vitamin B12 (Completed)   CK isoenzymes (brain, muscle injury) (Completed)       Follow up plan: Return if symptoms worsen or fail to improve.  Counseling provided for all of the vaccine components Orders Placed This Encounter  Procedures  . Thyroid Panel With TSH  . CBC with Differential/Platelet  . Sedimentation rate  . High sensitivity CRP  . Potassium  . Magnesium  . Vitamin B12  . CK isoenzymes (brain, muscle injury)  . Ambulatory referral to [place] Dre Gamino, MD [facility] 06/12/2017, 4:54 PM

## 2017-06-13 ENCOUNTER — Other Ambulatory Visit: Payer: Self-pay | Admitting: Pharmacist

## 2017-06-13 MED ORDER — EVOLOCUMAB 140 MG/ML ~~LOC~~ SOAJ: 140.0000 mg | SUBCUTANEOUS | 3 refills | Status: DC

## 2017-06-14 LAB — CBC WITH DIFFERENTIAL/PLATELET
Basophils Absolute: 0 10*3/uL (ref 0.0–0.2)
Basos: 1 %
EOS (ABSOLUTE): 0.3 10*3/uL (ref 0.0–0.4)
EOS: 5 %
HEMOGLOBIN: 10.7 g/dL — AB (ref 13.0–17.7)
Hematocrit: 33.5 % — ABNORMAL LOW (ref 37.5–51.0)
Immature Grans (Abs): 0 10*3/uL (ref 0.0–0.1)
Immature Granulocytes: 0 %
LYMPHS ABS: 1.7 10*3/uL (ref 0.7–3.1)
Lymphs: 26 %
MCH: 27.2 pg (ref 26.6–33.0)
MCHC: 31.9 g/dL (ref 31.5–35.7)
MCV: 85 fL (ref 79–97)
MONOCYTES: 6 %
MONOS ABS: 0.4 10*3/uL (ref 0.1–0.9)
NEUTROS ABS: 4.2 10*3/uL (ref 1.4–7.0)
Neutrophils: 62 %
Platelets: 250 10*3/uL (ref 150–379)
RBC: 3.93 x10E6/uL — AB (ref 4.14–5.80)
RDW: 15.9 % — AB (ref 12.3–15.4)
WBC: 6.7 10*3/uL (ref 3.4–10.8)

## 2017-06-14 LAB — SEDIMENTATION RATE: SED RATE: 3 mm/h (ref 0–30)

## 2017-06-14 LAB — THYROID PANEL WITH TSH
FREE THYROXINE INDEX: 1.5 (ref 1.2–4.9)
T3 Uptake Ratio: 26 % (ref 24–39)
T4 TOTAL: 5.8 ug/dL (ref 4.5–12.0)
TSH: 4.12 u[IU]/mL (ref 0.450–4.500)

## 2017-06-14 LAB — CK ISOENZYMES
CK-BB: 0 %
CK-MB: 0 % (ref 0–3)
CK-MM: 96 % — AB (ref 97–100)
MACRO TYPE 2: 0 %
Macro Type 1: 4 % — ABNORMAL HIGH
Total CK: 1321 U/L (ref 24–204)

## 2017-06-14 LAB — HIGH SENSITIVITY CRP: CRP, High Sensitivity: 1.32 mg/L (ref 0.00–3.00)

## 2017-06-14 LAB — VITAMIN B12: Vitamin B-12: 350 pg/mL (ref 232–1245)

## 2017-06-14 LAB — POTASSIUM: POTASSIUM: 4.8 mmol/L (ref 3.5–5.2)

## 2017-06-14 LAB — MAGNESIUM: MAGNESIUM: 2.4 mg/dL — AB (ref 1.6–2.3)

## 2017-06-17 ENCOUNTER — Other Ambulatory Visit: Payer: Self-pay | Admitting: Family Medicine

## 2017-06-20 ENCOUNTER — Telehealth: Payer: Self-pay | Admitting: Family Medicine

## 2017-06-20 NOTE — Telephone Encounter (Signed)
Patient's appt was declined and having to find another specialist.

## 2017-06-22 ENCOUNTER — Telehealth: Payer: Self-pay

## 2017-06-22 ENCOUNTER — Other Ambulatory Visit: Payer: Self-pay | Admitting: Family Medicine

## 2017-06-22 DIAGNOSIS — M791 Myalgia, unspecified site: Secondary | ICD-10-CM

## 2017-06-22 NOTE — Telephone Encounter (Signed)
Just let him know we are still trying

## 2017-06-22 NOTE — Telephone Encounter (Signed)
Wants to know Why Rheumatologist would not see him  My readings are 4 times what they should be   (he has been referred to Dr Amil Amen but have not gotten a response yet)

## 2017-06-23 NOTE — Telephone Encounter (Signed)
Last seen 06/12/16  Dr Dettinger

## 2017-06-29 ENCOUNTER — Telehealth: Payer: Self-pay | Admitting: Family Medicine

## 2017-06-29 NOTE — Telephone Encounter (Signed)
Pt wants a steroid sent back in with muscle cramps. Please advise. [employer]

## 2017-06-30 MED ORDER — PREDNISONE 20 MG PO TABS: ORAL_TABLET | ORAL | 0 refills | Status: DC

## 2017-06-30 NOTE — Telephone Encounter (Signed)
I sent in a steroid pack for him.  We are still trying to work on that referral

## 2017-06-30 NOTE — Telephone Encounter (Signed)
Pt aware - he did get a appt for 07/20/17 - [facility].

## 2017-07-04 ENCOUNTER — Other Ambulatory Visit: Payer: Self-pay | Admitting: *Deleted

## 2017-07-04 DIAGNOSIS — M791 Myalgia, unspecified site: Secondary | ICD-10-CM

## 2017-07-04 NOTE — Telephone Encounter (Signed)
Fax received [facility] Refill Baclofen Pt has appointment with specialist on 07/20/17 Would like refill to get him till appointment Please advise

## 2017-07-05 ENCOUNTER — Ambulatory Visit (AMBULATORY_SURGERY_CENTER): Payer: Medicare Other | Admitting: Gastroenterology

## 2017-07-05 ENCOUNTER — Encounter: Payer: Self-pay | Admitting: Gastroenterology

## 2017-07-05 ENCOUNTER — Other Ambulatory Visit: Payer: Self-pay

## 2017-07-05 VITALS — BP 148/67 | HR 48 | Temp 97.1°F | Resp 10 | Ht 70.0 in | Wt 191.0 lb

## 2017-07-05 DIAGNOSIS — I2510 Atherosclerotic heart disease of native coronary artery without angina pectoris: Secondary | ICD-10-CM | POA: Diagnosis not present

## 2017-07-05 DIAGNOSIS — D128 Benign neoplasm of rectum: Secondary | ICD-10-CM

## 2017-07-05 DIAGNOSIS — D123 Benign neoplasm of transverse colon: Secondary | ICD-10-CM

## 2017-07-05 DIAGNOSIS — Z86010 Personal history of colonic polyps: Secondary | ICD-10-CM

## 2017-07-05 DIAGNOSIS — J449 Chronic obstructive pulmonary disease, unspecified: Secondary | ICD-10-CM | POA: Diagnosis not present

## 2017-07-05 DIAGNOSIS — K621 Rectal polyp: Secondary | ICD-10-CM | POA: Diagnosis not present

## 2017-07-05 MED ORDER — BACLOFEN 10 MG PO TABS: ORAL_TABLET | ORAL | 0 refills | Status: DC

## 2017-07-05 MED ORDER — SODIUM CHLORIDE 0.9 % IV SOLN: 500.0000 mL | Freq: Once | INTRAVENOUS | Status: DC

## 2017-07-05 NOTE — Patient Instructions (Signed)
Polyps (handout given) Hemorhoids (handout given)  YOU HAD AN ENDOSCOPIC PROCEDURE TODAY AT [place]:   Refer to the procedure report that was given to you for any specific questions about what was found during the examination.  If the procedure report does not answer your questions, please call your gastroenterologist to clarify.  If you requested that your care partner not be given the details of your procedure findings, then the procedure report has been included in a sealed envelope for you to review at your convenience later.  YOU SHOULD EXPECT: Some feelings of bloating in the abdomen. Passage of more gas than usual.  Walking can help get rid of the air that was put into your GI tract during the procedure and reduce the bloating. If you had a lower endoscopy (such as a colonoscopy or flexible sigmoidoscopy) you may notice spotting of blood in your stool or on the toilet paper. If you underwent a bowel prep for your procedure, you may not have a normal bowel movement for a few days.  Please Note:  You might notice some irritation and congestion in your nose or some drainage.  This is from the oxygen used during your procedure.  There is no need for concern and it should clear up in a day or so.  SYMPTOMS TO REPORT IMMEDIATELY:   Following lower endoscopy (colonoscopy or flexible sigmoidoscopy):  Excessive amounts of blood in the stool  Significant tenderness or worsening of abdominal pains  Swelling of the abdomen that is new, acute  Fever of 100F or higher   For urgent or emergent issues, a gastroenterologist can be reached at any hour by calling [phone].   DIET:  We do recommend a small meal at first, but then you may proceed to your regular diet.  Drink plenty of fluids but you should avoid alcoholic beverages for 24 hours.  ACTIVITY:  You should plan to take it easy for the rest of today and you should NOT DRIVE or use heavy machinery until tomorrow  (because of the sedation medicines used during the test).    FOLLOW UP: Our staff will call the number listed on your records the next business day following your procedure to check on you and address any questions or concerns that you may have regarding the information given to you following your procedure. If we do not reach you, we will leave a message.  However, if you are feeling well and you are not experiencing any problems, there is no need to return our call.  We will assume that you have returned to your regular daily activities without incident.  If any biopsies were taken you will be contacted by phone or by letter within the next 1-3 weeks.  Please call us at [phone] if you have not heard about the biopsies in 3 weeks.    SIGNATURES/CONFIDENTIALITY: You and/or your care partner have signed paperwork which will be entered into your electronic medical record.  These signatures attest to the fact that that the information above on your After Visit Summary has been reviewed and is understood.  Full responsibility of the confidentiality of this discharge information lies with you and/or your care-partner.

## 2017-07-05 NOTE — Progress Notes (Signed)
Pt's states no medical or surgical changes since previsit or office visit. 

## 2017-07-05 NOTE — Progress Notes (Signed)
Report given to PACU, vss 

## 2017-07-05 NOTE — Op Note (Signed)
[place] Patient Name: [name] Procedure Date: 07/05/2017 10:04 AM MRN: [id number] Endoscopist: Ladene Artist , MD Age: 82 Referring MD:  Date of Birth: 07/14/1935 Gender: Male Account #: [id number] Procedure:                Colonoscopy Indications:              Surveillance: Personal history of adenomatous                            polyps on last colonoscopy 5 years ago Medicines:                Monitored Anesthesia Care Procedure:                Pre-Anesthesia Assessment:                           - Prior to the procedure, a History and Physical                            was performed, and patient medications and                            allergies were reviewed. The patient's tolerance of                            previous anesthesia was also reviewed. The risks                            and benefits of the procedure and the sedation                            options and risks were discussed with the patient.                            All questions were answered, and informed consent                            was obtained. Prior Anticoagulants: The patient has                            taken no previous anticoagulant or antiplatelet                            agents. ASA Grade Assessment: II - A patient with                            mild systemic disease. After reviewing the risks                            and benefits, the patient was deemed in                            satisfactory condition to undergo the procedure.  After obtaining informed consent, the colonoscope                            was passed under direct vision. Throughout the                            procedure, the patient's blood pressure, pulse, and                            oxygen saturations were monitored continuously. The                            Colonoscope was introduced through the anus and                            advanced to the the  cecum, identified by                            appendiceal orifice and ileocecal valve. The                            ileocecal valve, appendiceal orifice, and rectum                            were photographed. The quality of the bowel                            preparation was good. The colonoscopy was performed                            without difficulty. The patient tolerated the                            procedure well. Scope In: 10:08:31 AM Scope Out: 10:26:13 AM Scope Withdrawal Time: 0 hours 14 minutes 10 seconds  Total Procedure Duration: 0 hours 17 minutes 42 seconds  Findings:                 The perianal and digital rectal examinations were                            normal.                           A single medium-sized localized angiodysplastic                            lesion without bleeding was found in the cecum.                           Three sessile polyps were found in the rectum,                            transverse colon and hepatic flexure. The polyps  were 6 to 7 mm in size. These polyps were removed                            with a cold snare. Resection and retrieval were                            complete.                           Internal hemorrhoids were found during                            retroflexion. The hemorrhoids were medium-sized and                            Grade I (internal hemorrhoids that do not prolapse).                           The exam was otherwise without abnormality on                            direct and retroflexion views. Complications:            No immediate complications. Estimated blood loss:                            None. Estimated Blood Loss:     Estimated blood loss: none. Impression:               - A single non-bleeding colonic angiodysplastic                            lesion.                           - Three 6 to 7 mm polyps in the rectum, in the                             transverse colon and at the hepatic flexure,                            removed with a cold snare. Resected and retrieved.                           - Internal hemorrhoids.                           - The examination was otherwise normal on direct                            and retroflexion views. Recommendation:           - Patient has a contact number available for                            emergencies. The signs and symptoms of potential  delayed complications were discussed with the                            patient. Return to normal activities tomorrow.                            Written discharge instructions were provided to the                            patient.                           - Resume previous diet.                           - Continue present medications.                           - Await pathology results.                           - No repeat colonoscopy due to age. Ladene Artist, MD 07/05/2017 10:34:01 AM This report has been signed electronically.

## 2017-07-05 NOTE — Progress Notes (Signed)
Called to room to assist during endoscopic procedure.  Patient ID and intended procedure confirmed with present staff. Received instructions for my participation in the procedure from the performing physician.  

## 2017-07-06 ENCOUNTER — Telehealth: Payer: Self-pay

## 2017-07-06 ENCOUNTER — Telehealth: Payer: Self-pay | Admitting: *Deleted

## 2017-07-06 NOTE — Telephone Encounter (Signed)
  Follow up Call-  Call back number 07/05/2017  Post procedure Call Back phone  # [phone]  Permission to leave phone message Yes  Some recent data might be hidden     Patient questions:  Do you have a fever, pain , or abdominal swelling? No. Pain Score  0 *  Have you tolerated food without any problems? Yes.    Have you been able to return to your normal activities? yes  Do you have any questions about your discharge instructions: Diet   No. Medications  No. Follow up visit  No.  Do you have questions or concerns about your Care? Yes.    Actions: * If pain score is 4 or above: No action needed, pain <4.  Patient has concerns that his mother died in her 15's from Colon CA and may be interested in continuing with future colonoscopies

## 2017-07-06 NOTE — Telephone Encounter (Signed)
No voice mail.

## 2017-07-11 ENCOUNTER — Encounter: Payer: Self-pay | Admitting: Gastroenterology

## 2017-07-13 ENCOUNTER — Telehealth: Payer: Self-pay | Admitting: Gastroenterology

## 2017-07-13 NOTE — Telephone Encounter (Signed)
Pt called inquiring path results

## 2017-07-14 NOTE — Telephone Encounter (Signed)
I reviewed the pathology results with the patient and all questions answered.  He will call back for any GI concerns

## 2017-07-20 DIAGNOSIS — M791 Myalgia, unspecified site: Secondary | ICD-10-CM | POA: Diagnosis not present

## 2017-07-20 DIAGNOSIS — R748 Abnormal levels of other serum enzymes: Secondary | ICD-10-CM | POA: Diagnosis not present

## 2017-07-20 DIAGNOSIS — R252 Cramp and spasm: Secondary | ICD-10-CM | POA: Diagnosis not present

## 2017-07-20 DIAGNOSIS — E663 Overweight: Secondary | ICD-10-CM | POA: Diagnosis not present

## 2017-07-20 DIAGNOSIS — Z6827 Body mass index (BMI) 27.0-27.9, adult: Secondary | ICD-10-CM | POA: Diagnosis not present

## 2017-07-20 DIAGNOSIS — M6281 Muscle weakness (generalized): Secondary | ICD-10-CM | POA: Diagnosis not present

## 2017-07-30 ENCOUNTER — Other Ambulatory Visit: Payer: Self-pay | Admitting: Family Medicine

## 2017-08-01 MED ORDER — OMEPRAZOLE 20 MG PO CPDR: 20.0000 mg | DELAYED_RELEASE_CAPSULE | Freq: Every day | ORAL | 0 refills | Status: DC

## 2017-08-01 NOTE — Addendum Note (Signed)
Addended by: Antonietta Barcelona D on: 08/01/2017 08:48 AM   Modules accepted: Orders

## 2017-08-20 IMAGING — DX DG CHEST 2V
2 series · 2 of 2 positions shown · non-contrast
Comparison: Chest x-ray of 04/02/2008

CLINICAL DATA: Shortness of breath, weight gain

EXAM:
CHEST  2 VIEW

[chest pa]
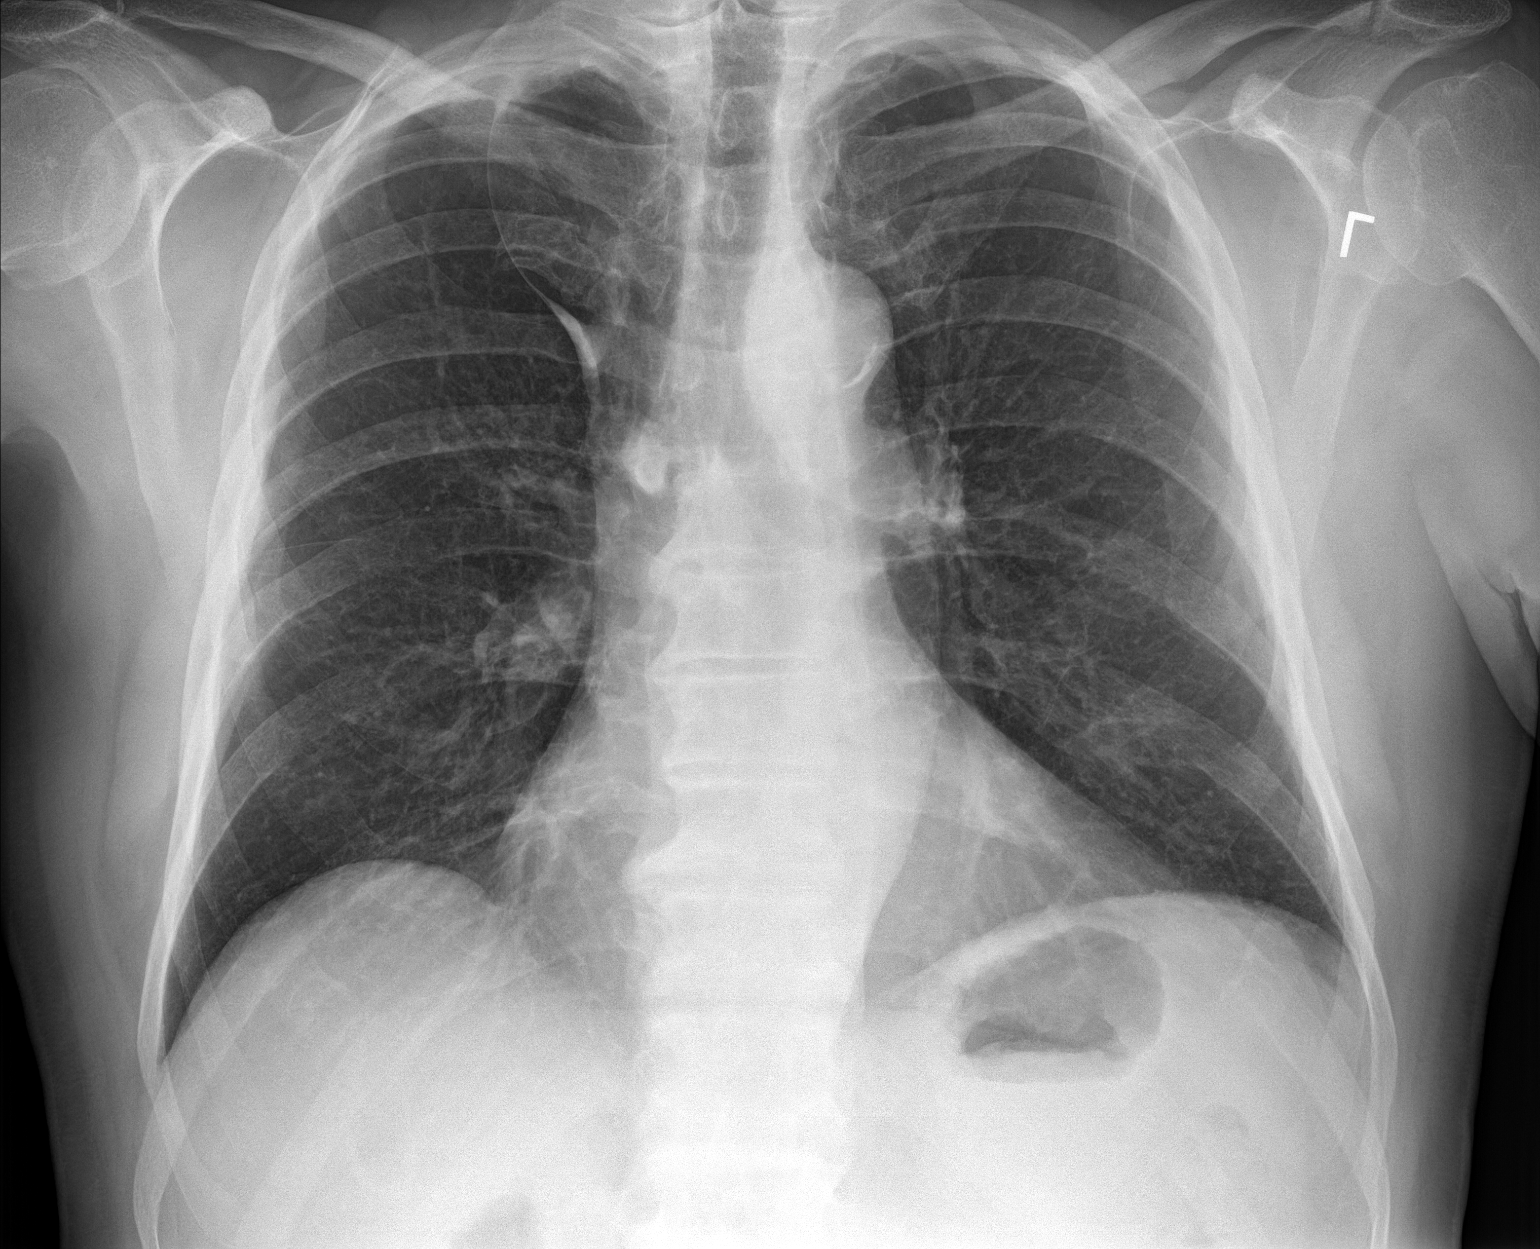

[chest lat]
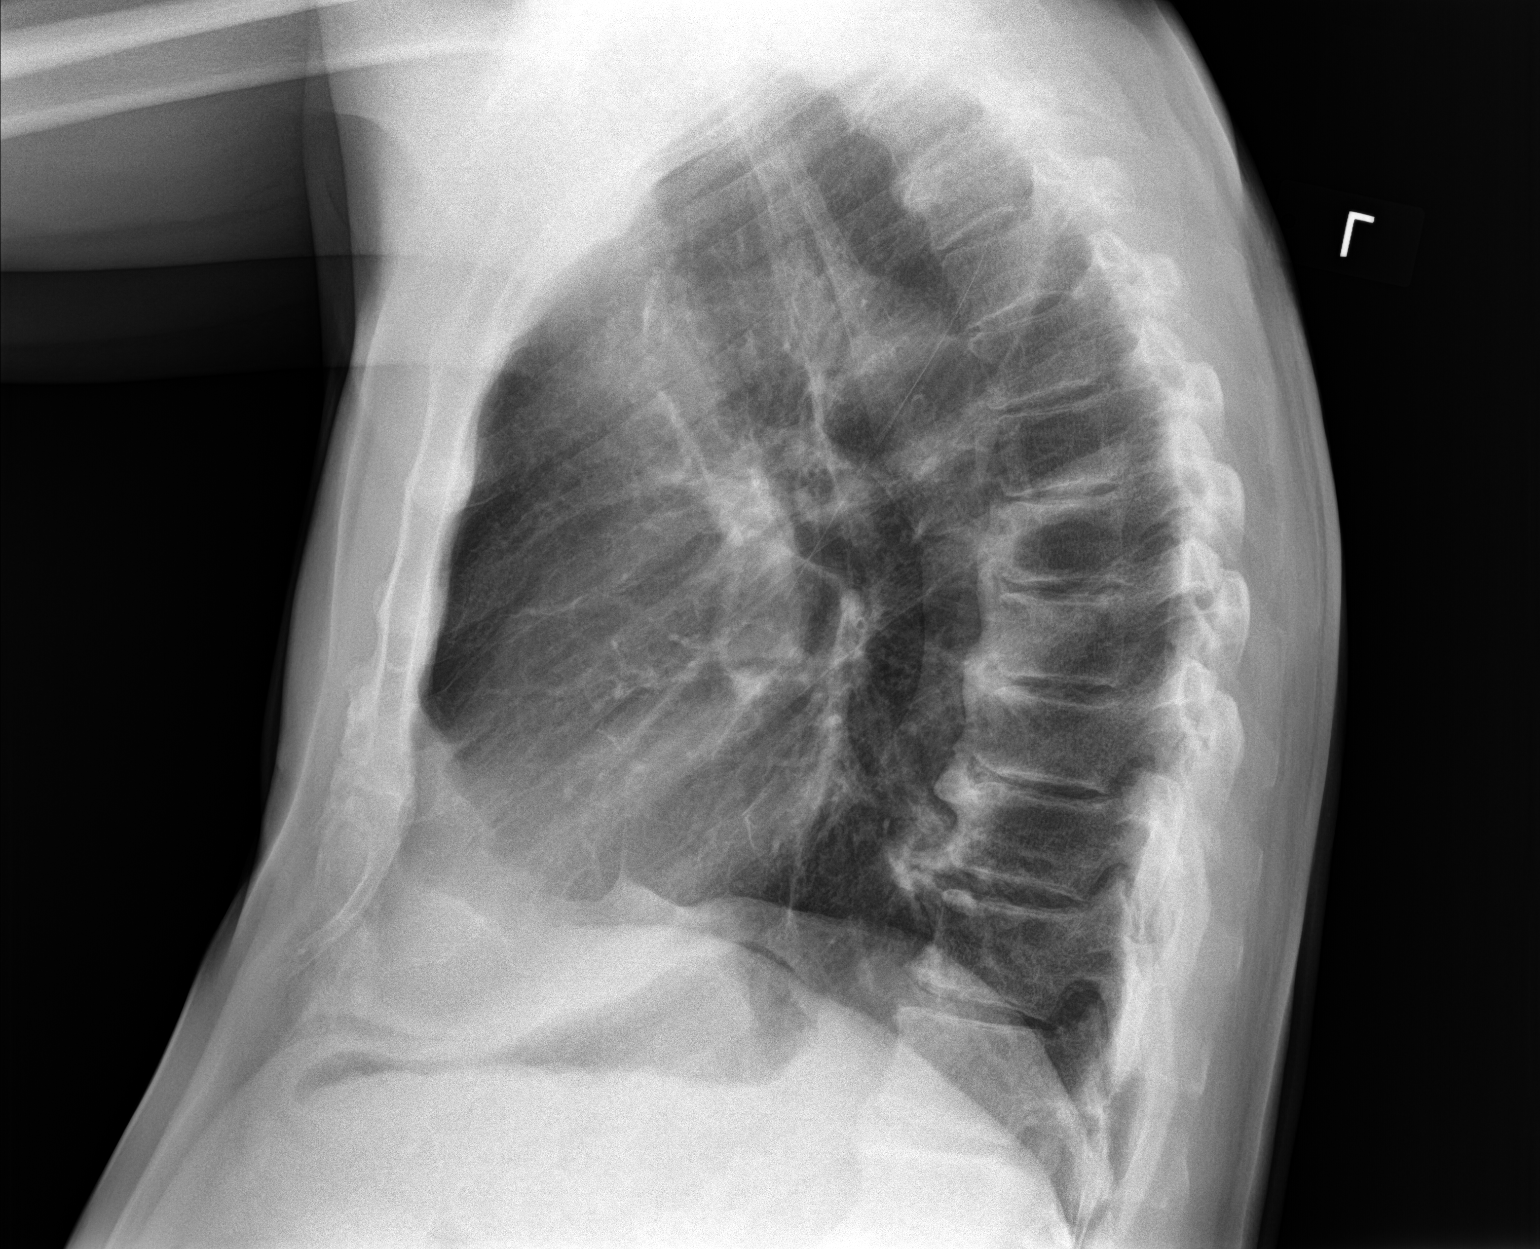

[2 of 2 positions shown; findings below may reference images not displayed]

FINDINGS: No active infiltrate or effusion is seen. On the frontal view there
is and oval opacity overlying the right suprahilar region medially.
This probably represents overlapping bony and vascular structures,
but attention to this area on followup chest x-ray is recommended. A
CT chest could be performed as well if further assessment is
warranted. The heart is within upper limits of normal. There are
degenerative changes throughout the mid to lower thoracic spine.
IMPRESSION: 1. No active lung disease.
2. Oval opacity overlying the right suprahilar region probably
superimposed bony and vascular structures. Consider followup chest
x-ray or CT of the chest to assess further.

## 2017-08-21 DIAGNOSIS — Z6827 Body mass index (BMI) 27.0-27.9, adult: Secondary | ICD-10-CM | POA: Diagnosis not present

## 2017-08-21 DIAGNOSIS — R748 Abnormal levels of other serum enzymes: Secondary | ICD-10-CM | POA: Diagnosis not present

## 2017-08-21 DIAGNOSIS — E663 Overweight: Secondary | ICD-10-CM | POA: Diagnosis not present

## 2017-08-21 DIAGNOSIS — M791 Myalgia, unspecified site: Secondary | ICD-10-CM | POA: Diagnosis not present

## 2017-08-21 DIAGNOSIS — M6281 Muscle weakness (generalized): Secondary | ICD-10-CM | POA: Diagnosis not present

## 2017-08-21 DIAGNOSIS — R252 Cramp and spasm: Secondary | ICD-10-CM | POA: Diagnosis not present

## 2017-08-26 IMAGING — CT CT CHEST W/ CM
2 of 8 series · 13 of 46 positions shown, 18 images · IV contrast (iopamidol)
Comparison: Chest radiograph 08/26/2015, 04/02/2008, and earlier.

CLINICAL DATA: 79-year-old male with questionable oval nodule
projecting at the superior right hilum on recent chest radiographs
performed for shortness of Breath. Subsequent encounter.

EXAM:
CT CHEST WITH CONTRAST
TECHNIQUE: Multidetector CT imaging of the chest was performed during
intravenous contrast administration.
CONTRAST:  75mL [id number] IOPAMIDOL (LM6FOP-B88) INJECTION 61%

[Series 2: routine chest w without · axial · non-contrast · 0.68mm/px · z∈[-318,-56]mm · 10 of 159 slices shown, 15 images]
[im 14/159  soft-tissue]
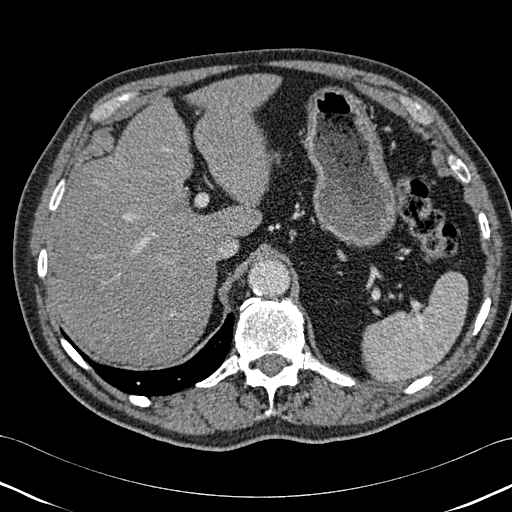
[im 14/159  bone]
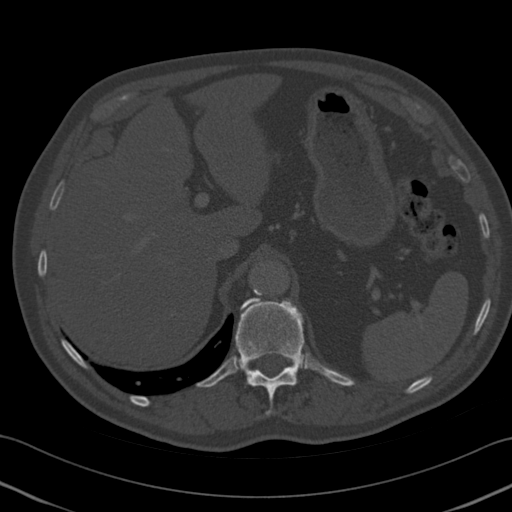
[im 27/159  soft-tissue]
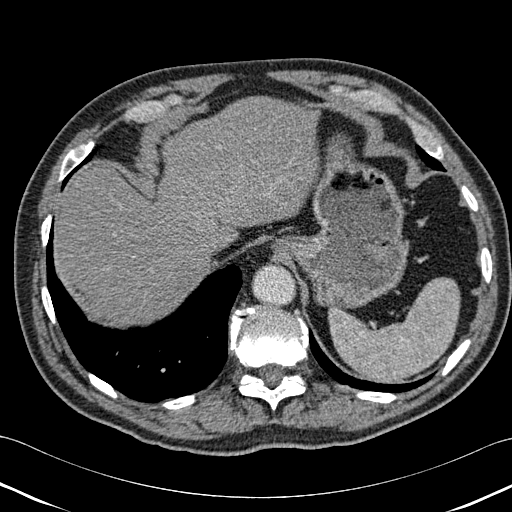
[im 53/159  soft-tissue]
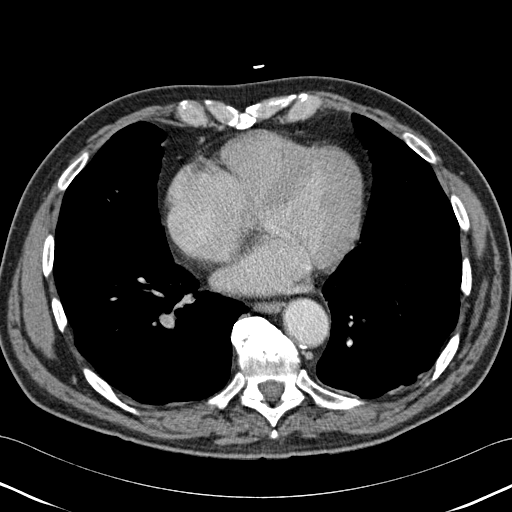
[im 66/159  soft-tissue]
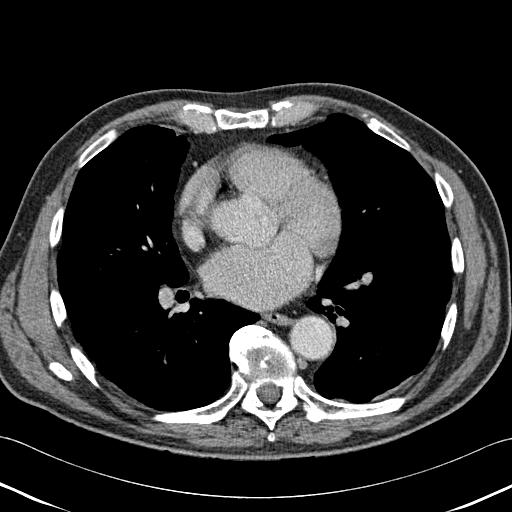
[im 80/159  soft-tissue]
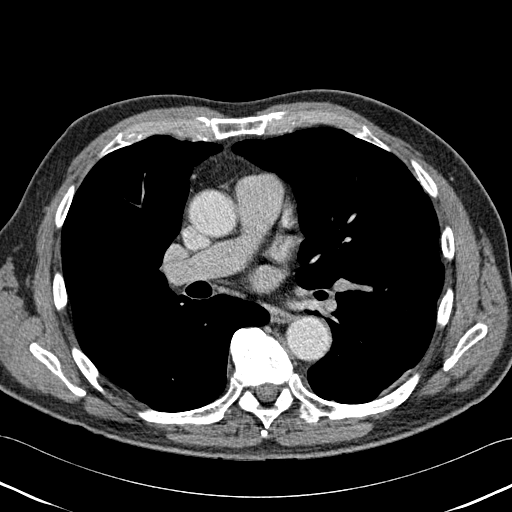
[im 93/159  soft-tissue]
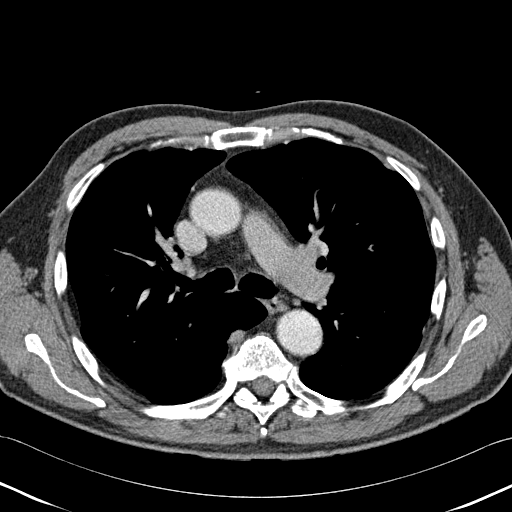
[im 106/159  soft-tissue]
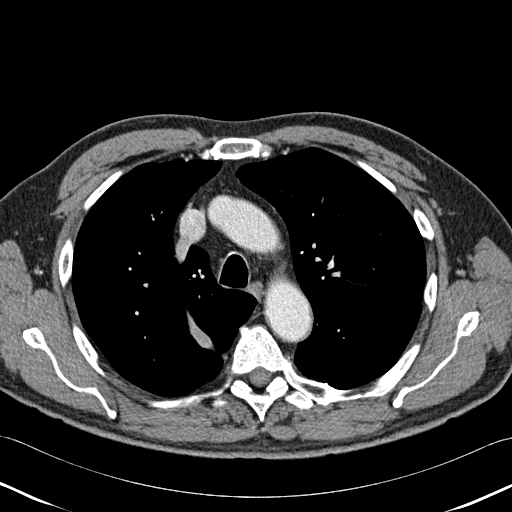
[im 106/159  lung]
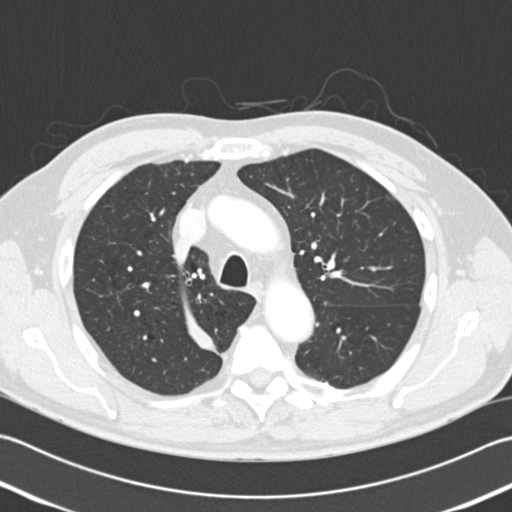
[im 119/159  lung]
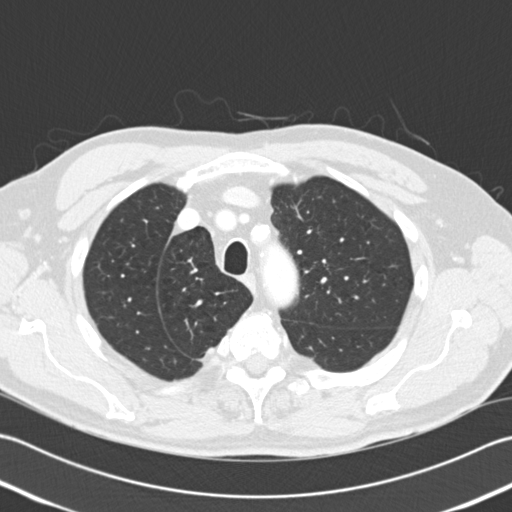
[im 132/159  soft-tissue]
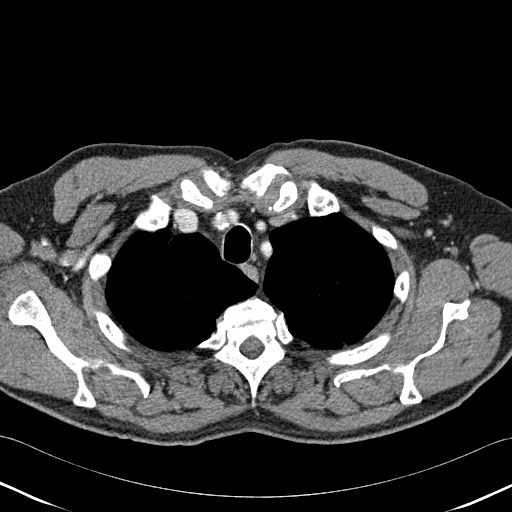
[im 132/159  lung]
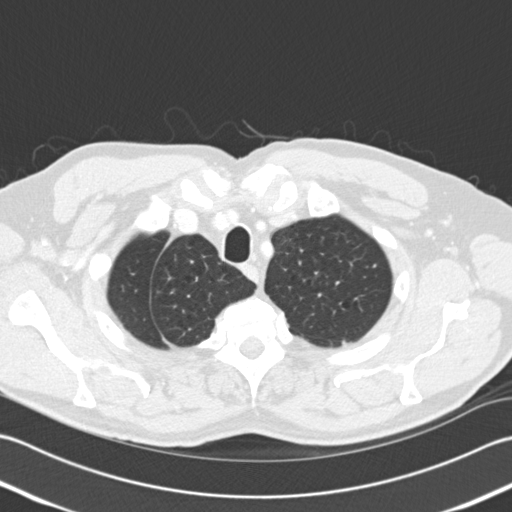
[im 145/159  soft-tissue]
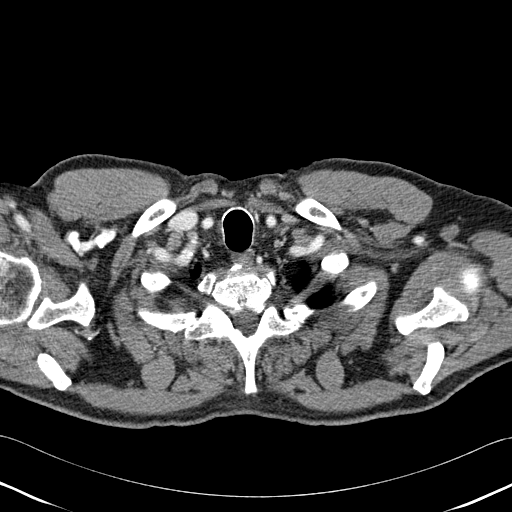
[im 145/159  lung]
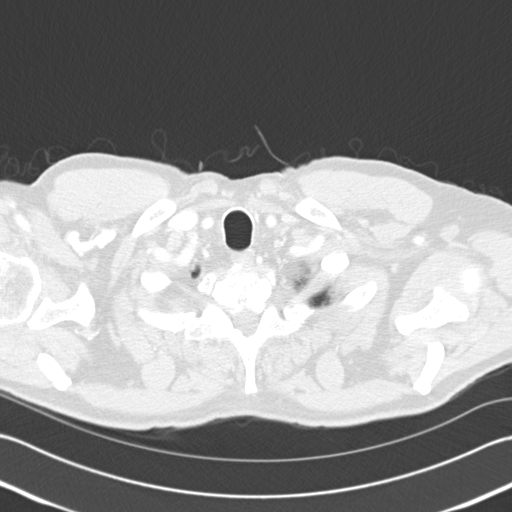
[im 145/159  bone]
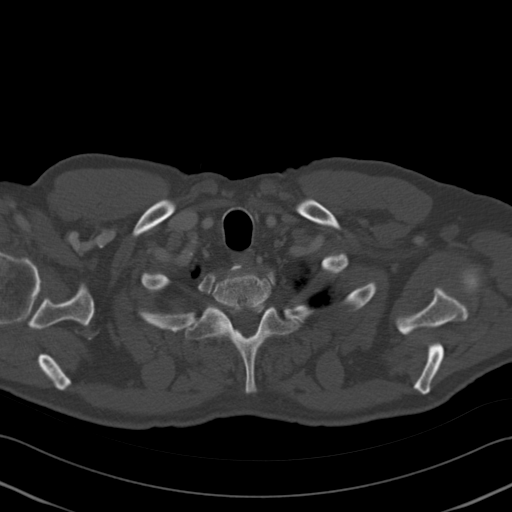

[Series 6: coronal · coronal · 0.64mm/px · 3 of 135 slices shown]
[im 34/135  soft-tissue]
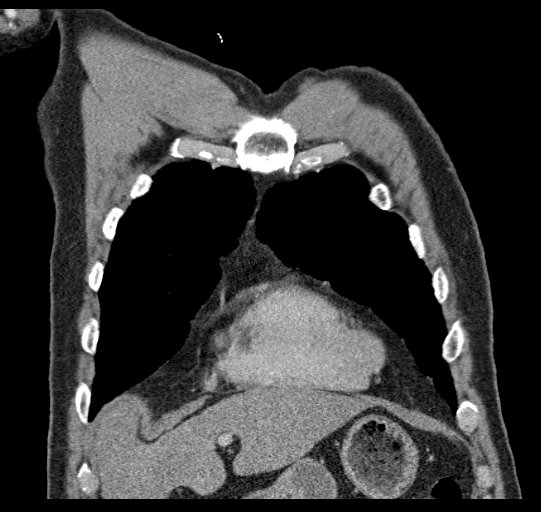
[im 68/135  soft-tissue]
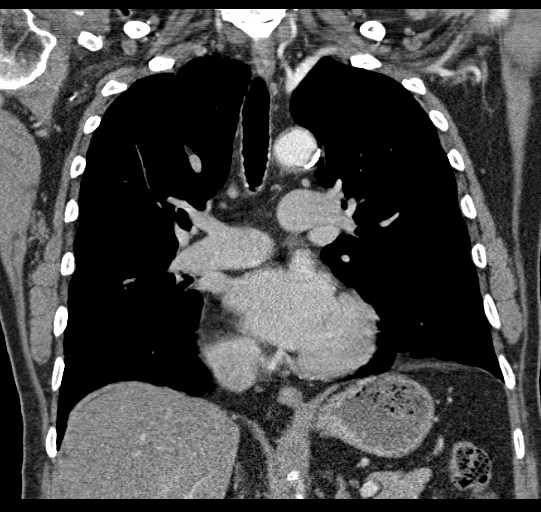
[im 101/135  soft-tissue]
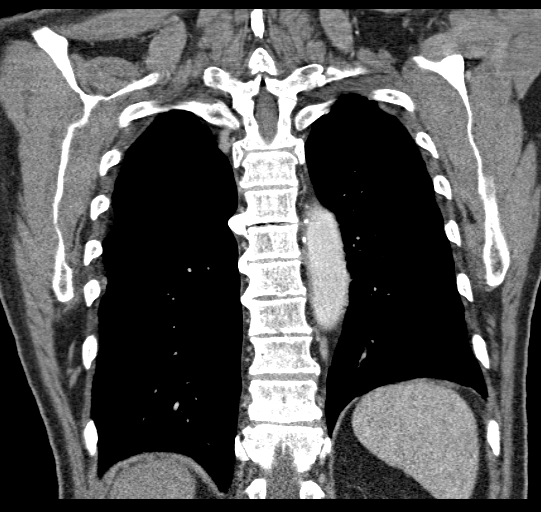

[13 of 46 positions shown; findings below may reference images not displayed]

FINDINGS: The oval density projecting over the superior right hilum is related
to prominent right lateral degenerative endplate osteophytosis at
T6-T7 as seen on coronal series 6, image 102 today. Degenerative
vacuum disc phenomena incidentally also noted at that level. Other
flowing right antral lateral thoracic spine endplate osteophytes
continue inferiorly to the thoracolumbar junction. No acute or
suspicious osseous lesion.

Major airways are patent. Incidental right lung azygos fissure
(normal variant). Mild apical lung scarring. Subtle centrilobular
emphysema mostly apparent in the right upper lobe. Mild scarring in
both costophrenic angles occasional 1-2 mm peripheral right lung
nodules are noted and appear to be postinflammatory. There is also
evidence of a small arterial venous malformation in the right
costophrenic angle (series 4, image 150) the significance of which
is doubtful. There is mild pleural thickening along the posterior
lungs, greater on the left. This is noncalcified and without
adjacent lung abnormality. No pleural effusion.

No pericardial effusion. Mild cardiomegaly. Calcified coronary
artery and to a lesser extent aortic atherosclerosis. No thoracic
lymphadenopathy. Negative thoracic inlet.

Visualized liver remarkable for us small 10 mm low-density area in
the central right lobe with the densitometry suggesting a benign
cyst (series 2, image 158). The gallbladder is contracted and
otherwise normal. Negative visualized spleen, pancreas, adrenal
glands, kidneys, and bowel in the upper abdomen.
IMPRESSION: 1. Right suprahilar oval density identified on recent chest
radiographs corresponds to degenerative endplate spurring of the
spine at T6-T7.
2. No acute pulmonary findings. Evidence of a small right
costophrenic angle pulmonary AVM, the significance of which is
doubtful. Mild emphysema and postinflammatory changes including mild
posterior pleural thickening.
3. Mild cardiomegaly. Calcified coronary artery and aortic
atherosclerosis. No thoracic lymphadenopathy.
4. Negative visualized upper abdominal viscera; small probable
benign cyst the central liver.

## 2017-09-08 ENCOUNTER — Ambulatory Visit (INDEPENDENT_AMBULATORY_CARE_PROVIDER_SITE_OTHER): Payer: Medicare Other | Admitting: Family

## 2017-09-08 ENCOUNTER — Encounter: Payer: Self-pay | Admitting: Family

## 2017-09-08 ENCOUNTER — Other Ambulatory Visit: Payer: Self-pay

## 2017-09-08 ENCOUNTER — Ambulatory Visit (HOSPITAL_COMMUNITY)
Admission: RE | Admit: 2017-09-08 | Discharge: 2017-09-08 | Disposition: A | Discharge status: Home / Self Care | Payer: Medicare Other | Source: Ambulatory Visit | Attending: Family | Admitting: Family

## 2017-09-08 VITALS — BP 146/83 | HR 70 | Resp 20 | Ht 70.0 in | Wt 193.0 lb

## 2017-09-08 DIAGNOSIS — I6529 Occlusion and stenosis of unspecified carotid artery: Secondary | ICD-10-CM | POA: Diagnosis not present

## 2017-09-08 DIAGNOSIS — Z87891 Personal history of nicotine dependence: Secondary | ICD-10-CM | POA: Diagnosis not present

## 2017-09-08 DIAGNOSIS — I6521 Occlusion and stenosis of right carotid artery: Secondary | ICD-10-CM

## 2017-09-08 DIAGNOSIS — I6523 Occlusion and stenosis of bilateral carotid arteries: Secondary | ICD-10-CM | POA: Insufficient documentation

## 2017-09-08 DIAGNOSIS — Z9889 Other specified postprocedural states: Secondary | ICD-10-CM

## 2017-09-08 NOTE — Patient Instructions (Signed)

## 2017-09-08 NOTE — Progress Notes (Signed)
Chief Complaint: Follow up Extracranial Carotid Artery Stenosis   History of Present Illness  [name] is a 82 y.o. male who is status post right CEA in 2007 by Dr. Kellie Simmering. His carotid stenosis was discovered by his PCP when he heard a bruit.  He has no history of TIA or stroke symptoms.Specifically the patient denies a history of amaurosis fugax or monocular blindness, unilateral facial drooping, hemiplegia, or receptive or expressive aphasia.  He denies claudication type symptoms with walking, denies non-healing wounds. He works part time and stays physically active.  He saw a cardiologist for PVC's, stopped drinking caffeine and he has had almost no PVC's since then.  He states both hips, left worse than right, are somewhat painful when he walks; pt states this has not yet been evaluated. He denies any known back problems. He has COPD and if he does too much dyspnea will slow him down.   Diabetic: No Tobacco use: former smoker, quit in 1998, smoked for 40 years  Pt meds include: Statin : no, had to stop the statin due to myalgias, took statins for 20 years, started [employer]; now taking Repatha; pt states this is working well for him ASA: Yes Other anticoagulants/antiplatelets: no    Past Medical History:  Diagnosis Date  . Adenomatous colon polyp 11/1999  . Allergy   . Blood transfusion without reported diagnosis   . Cancer [facility])    Prostate  . Carotid artery occlusion   . Cataract   . COPD (chronic obstructive pulmonary disease) ([place])   . GERD (gastroesophageal reflux disease)   . Hyperlipidemia   . Hypertension   . Internal hemorrhoids   . PVC (premature ventricular contraction)     Social History Social History   Tobacco Use  . Smoking status: Former Smoker    Years: 40.00    Types: Cigarettes    Last attempt to quit: 01/19/1997    Years since quitting: 20.6  . Smokeless tobacco: Never Used  Substance Use Topics  . Alcohol use: No  .  Drug use: No    Family History Family History  Problem Relation Age of Onset  . Colon cancer Mother   . Cancer Mother        colon  . Heart disease Father        Heart Disease before age 35  . Heart attack Father 49  . Arthritis Sister   . Esophageal cancer Neg Hx   . Rectal cancer Neg Hx   . Stomach cancer Neg Hx   . Colon polyps Neg Hx     Surgical History Past Surgical History:  Procedure Laterality Date  . APPENDECTOMY    . carotid surgery    . CATARACT EXTRACTION     both eyes  . COLONOSCOPY    . POLYPECTOMY    . TRANSURETHRAL RESECTION OF PROSTATE    . UPPER GASTROINTESTINAL ENDOSCOPY    . UPPER GI ENDOSCOPY  Nov. 3, 2014   Upper endoscopy with diatation    Allergies  Allergen Reactions  . Lamisil [Terbinafine Hcl] Other (See Comments)    Loss of taste     Current Outpatient Medications  Medication Sig Dispense Refill  . albuterol (PROVENTIL HFA;VENTOLIN HFA) 108 (90 Base) MCG/ACT inhaler Inhale 2 puffs into the lungs every 6 (six) hours as needed for wheezing or shortness of breath. 3 Inhaler 3  . aspirin 81 MG tablet Take 81 mg by mouth 2 (two) times daily.     Marland Kitchen  cetirizine (ZYRTEC) 10 MG tablet TAKE 1 TABLET DAILY 90 tablet 2  . Evolocumab (REPATHA SURECLICK) 073 MG/ML SOAJ Inject 140 mg into the skin every 14 (fourteen) days. 6 pen 3  . MYRBETRIQ 50 MG TB24 tablet TAKE 1 TABLET DAILY 90 tablet 1  . omeprazole (PRILOSEC) 20 MG capsule Take 1 capsule (20 mg total) by mouth daily. 90 capsule 0  . ramipril (ALTACE) 10 MG capsule TAKE 1 CAPSULE DAILY 90 capsule 1  . umeclidinium bromide (INCRUSE ELLIPTA) 62.5 MCG/INH AEPB Inhale 1 puff into the lungs daily. 90 each 0  . Vitamin D, Ergocalciferol, (DRISDOL) 50000 units CAPS capsule TAKE 1 CAPSULE WEEKLY 12 capsule 3  . baclofen (LIORESAL) 10 MG tablet TAKE  (1)  TABLET  THREE TIMES DAILY. (Patient not taking: Reported on 09/08/2017) 30 tablet 0  . predniSONE (DELTASONE) 20 MG tablet Take 3 tabs daily for 1 week,  then 2 tabs daily for week 2, then 1 tab daily for week 3. (Patient not taking: Reported on 09/08/2017) 42 tablet 0   Current Facility-Administered Medications  Medication Dose Route Frequency Provider Last Rate Last Dose  . 0.9 %  sodium chloride infusion  500 mL Intravenous Once Ladene Artist, MD        Review of Systems : See HPI for pertinent positives and negatives.  Physical Examination  Vitals:   09/08/17 0929 09/08/17 0932  BP: (!) 151/87 (!) 146/83  Pulse: 70   Resp: 20   SpO2: 98%   Weight: 193 lb (87.5 kg)   Height: 5\' 10"  (1.778 m)    Body mass index is 27.69 kg/m.  General: WDWN male in NAD GAIT: normal Eyes: PERRLA HENT: No gross abnormalities.  Pulmonary:  Respirations are non-labored, good air movement in al fields, CTAB, no rales, rhonchi, or wheezing. Cardiac: regular rhythm with occasional brief runs of irregular rhythm, no detected murmur.  VASCULAR EXAM Carotid Bruits Right Left   Negative Positive     Abdominal aortic pulse is not palpable. Radial pulses are 2+ palpable and equal.                                                                                                                            LE Pulses Right Left       POPLITEAL  not palpable   not palpable       POSTERIOR TIBIAL  2+ palpable   2+ palpable        DORSALIS PEDIS      ANTERIOR TIBIAL 2+ palpable  2+ palpable     Gastrointestinal: soft, nontender, BS WNL, no r/g, no palpable masses. Musculoskeletal: no muscle atrophy/wasting. M/S 5/5 throughout, extremities without ischemic changes. Skin: No rashes, no ulcers, no cellulitis.   Neurologic:  A&O X 3; appropriate affect, sensation is normal; speech is normal, CN 2-12 intact, pain and light touch intact in extremities, motor exam as listed above. Psychiatric: Normal thought content, mood appropriate to clinical  situation.    Assessment: [name] is a 82 y.o. male who isstatus post right CEA in 2007. He has no  history of stroke or TIA.   DATA  Carotid Duplex (09/08/17): Right ICA: CEA site with 1-39% stenosis Left ICA: 1-39% stenosis Bilateral vertebral artery flow is antegrade.  Bilateral subclavian artery waveforms are normal.  No significant change since the exams on 02-28-15 and 03-11-16.   Plan:  Follow-up in 18 months with Carotid Duplex scan.  I discussed in depth with the patient the nature of atherosclerosis, and emphasized the importance of maximal medical management including strict control of blood pressure, blood glucose, and lipid levels, obtaining regular exercise, and continued cessation of smoking.  The patient is aware that without maximal medical management the underlying atherosclerotic disease process will progress, limiting the benefit of any interventions. The patient was given information about stroke prevention and what symptoms should prompt the patient to seek immediate medical care. Thank you for allowing [place] to participate in this patient's care.  Clemon Chambers, RN, MSN, FNP-C [facility] Office: [phone]  Clinic Physician:   09/08/17 9:34 AM

## 2017-09-27 ENCOUNTER — Ambulatory Visit (INDEPENDENT_AMBULATORY_CARE_PROVIDER_SITE_OTHER): Payer: Medicare Other | Admitting: Family Medicine

## 2017-09-27 ENCOUNTER — Encounter: Payer: Self-pay | Admitting: Family Medicine

## 2017-09-27 VITALS — BP 139/81 | HR 75 | Temp 97.9°F | Ht 70.0 in | Wt 192.0 lb

## 2017-09-27 DIAGNOSIS — J439 Emphysema, unspecified: Secondary | ICD-10-CM

## 2017-09-27 DIAGNOSIS — E785 Hyperlipidemia, unspecified: Secondary | ICD-10-CM | POA: Diagnosis not present

## 2017-09-27 DIAGNOSIS — I10 Essential (primary) hypertension: Secondary | ICD-10-CM

## 2017-09-27 DIAGNOSIS — I6529 Occlusion and stenosis of unspecified carotid artery: Secondary | ICD-10-CM | POA: Diagnosis not present

## 2017-09-27 NOTE — Progress Notes (Signed)
BP 139/81   Pulse 75   Temp 97.9 F (36.6 C) (Oral)   Ht 5\' 10"  (1.778 m)   Wt 192 lb (87.1 kg)   BMI 27.55 kg/m    Subjective:    Patient ID: [name], male    DOB: 01-27-1936, 82 y.o.   MRN: [id number]  HPI: [name] is a 82 y.o. male presenting on 09/27/2017 for Hyperlipidemia; Hypertension; and COPD   HPI Hyperlipidemia Patient is coming in for recheck of his hyperlipidemia. The patient is currently taking Repatha, patient has been doing well on it. They deny any issues with myalgias or history of liver damage from it. They deny any focal numbness or weakness or chest pain.   Hypertension Patient is currently on ramipril, and their blood pressure today is 139/81. Patient denies any lightheadedness or dizziness. Patient denies headaches, blurred vision, chest pains, shortness of breath, or weakness. Denies any side effects from medication and is content with current medication.   COPD Patient is coming in for COPD recheck today.  He is currently on albuterol and Incruse.  He has a mild chronic cough but denies any major coughing spells or wheezing spells.  He has 0nighttime symptoms per week and 3daytime symptoms per week currently.   Relevant past medical, surgical, family and social history reviewed and updated as indicated. Interim medical history since our last visit reviewed. Allergies and medications reviewed and updated.  Review of Systems  Constitutional: Negative for chills and fever.  HENT: Negative for congestion.   Respiratory: Positive for cough and wheezing. Negative for shortness of breath.   Cardiovascular: Negative for chest pain and leg swelling.  Musculoskeletal: Negative for back pain and gait problem.  Skin: Negative for rash.  Neurological: Negative for dizziness, weakness, light-headedness and headaches.  All other systems reviewed and are negative.   Per HPI unless specifically indicated above   Allergies as of 09/27/2017    Reactions   Lamisil [terbinafine Hcl] Other (See Comments)   Loss of taste       Medication List        Accurate as of 09/27/17  8:30 AM. Always use your most recent med list.          albuterol 108 (90 Base) MCG/ACT inhaler Commonly known as:  PROVENTIL HFA;VENTOLIN HFA Inhale 2 puffs into the lungs every 6 (six) hours as needed for wheezing or shortness of breath.   aspirin 81 MG tablet Take 81 mg by mouth 2 (two) times daily.   cetirizine 10 MG tablet Commonly known as:  ZYRTEC TAKE 1 TABLET DAILY   Evolocumab 140 MG/ML Soaj Commonly known as:  REPATHA SURECLICK Inject 381 mg into the skin every 14 (fourteen) days.   MYRBETRIQ 50 MG Tb24 tablet Generic drug:  mirabegron ER TAKE 1 TABLET DAILY   omeprazole 20 MG capsule Commonly known as:  PRILOSEC Take 1 capsule (20 mg total) by mouth daily.   ramipril 10 MG capsule Commonly known as:  ALTACE TAKE 1 CAPSULE DAILY   umeclidinium bromide 62.5 MCG/INH Aepb Commonly known as:  INCRUSE ELLIPTA Inhale 1 puff into the lungs daily.   Vitamin D (Ergocalciferol) 50000 units Caps capsule Commonly known as:  DRISDOL TAKE 1 CAPSULE WEEKLY          Objective:    BP 139/81   Pulse 75   Temp 97.9 F (36.6 C) (Oral)   Ht 5\' 10"  (1.778 m)   Wt 192 lb (87.1 kg)   BMI  27.55 kg/m   Wt Readings from Last 3 Encounters:  09/27/17 192 lb (87.1 kg)  09/08/17 193 lb (87.5 kg)  07/05/17 191 lb (86.6 kg)    Physical Exam  Constitutional: He is oriented to person, place, and time. He appears well-developed and well-nourished. No distress.  Eyes: Conjunctivae are normal. No scleral icterus.  Neck: Neck supple. No thyromegaly present.  Cardiovascular: Normal rate, regular rhythm, normal heart sounds and intact distal pulses.  No murmur heard. Pulmonary/Chest: Effort normal and breath sounds normal. No respiratory distress. He has no wheezes. He has no rales.  Musculoskeletal: Normal range of motion. He exhibits no edema.    Lymphadenopathy:    He has no cervical adenopathy.  Neurological: He is alert and oriented to person, place, and time. Coordination normal.  Skin: Skin is warm and dry. No rash noted. He is not diaphoretic.  Psychiatric: He has a normal mood and affect. His behavior is normal.  Nursing note and vitals reviewed.       Assessment & Plan:   Problem List Items Addressed This Visit      Cardiovascular and Mediastinum   HTN (hypertension), benign - Primary     Respiratory   COPD (chronic obstructive pulmonary disease) ([place])     Other   Hyperlipidemia LDL goal <100      Gave sample of anoro  Myalgias have resolved since stopping fenofibrate and labs from rheumatologist showed that his CK improved greatly.   Follow up plan: Return in about 6 months (around 03/30/2018), or if symptoms worsen or fail to improve, for Hypertension hyperlipidemia.  Counseling provided for all of the vaccine components No orders of the defined types were placed in this encounter.   Caryl Pina, MD [facility] 09/27/2017, 8:30 AM

## 2017-10-05 ENCOUNTER — Telehealth: Payer: Self-pay | Admitting: Family Medicine

## 2017-10-05 MED ORDER — UMECLIDINIUM-VILANTEROL 62.5-25 MCG/INH IN AEPB: 1.0000 | INHALATION_SPRAY | Freq: Every day | RESPIRATORY_TRACT | 0 refills | Status: DC

## 2017-10-05 MED ORDER — UMECLIDINIUM-VILANTEROL 62.5-25 MCG/INH IN AEPB: 1.0000 | INHALATION_SPRAY | Freq: Every day | RESPIRATORY_TRACT | 3 refills | Status: DC

## 2017-10-05 NOTE — Telephone Encounter (Signed)
I sent the medication to his [facility], have him stop taking Incruse and have him call [facility] and tell them to stop sending him Incruse

## 2017-10-05 NOTE — Telephone Encounter (Signed)
Sent 1 month to [facility], pt aware

## 2017-10-20 ENCOUNTER — Other Ambulatory Visit: Payer: Self-pay | Admitting: Family Medicine

## 2017-10-30 ENCOUNTER — Other Ambulatory Visit: Payer: Self-pay | Admitting: Family Medicine

## 2017-10-30 DIAGNOSIS — N3281 Overactive bladder: Secondary | ICD-10-CM

## 2018-01-10 ENCOUNTER — Ambulatory Visit (INDEPENDENT_AMBULATORY_CARE_PROVIDER_SITE_OTHER): Payer: Medicare Other | Admitting: Family Medicine

## 2018-01-10 ENCOUNTER — Ambulatory Visit (INDEPENDENT_AMBULATORY_CARE_PROVIDER_SITE_OTHER): Payer: Medicare Other

## 2018-01-10 ENCOUNTER — Encounter: Payer: Self-pay | Admitting: Family Medicine

## 2018-01-10 VITALS — BP 130/69 | HR 70 | Temp 96.8°F | Ht 70.0 in | Wt 188.0 lb

## 2018-01-10 DIAGNOSIS — R590 Localized enlarged lymph nodes: Secondary | ICD-10-CM

## 2018-01-10 DIAGNOSIS — R630 Anorexia: Secondary | ICD-10-CM | POA: Diagnosis not present

## 2018-01-10 DIAGNOSIS — R634 Abnormal weight loss: Secondary | ICD-10-CM | POA: Diagnosis not present

## 2018-01-10 DIAGNOSIS — I6529 Occlusion and stenosis of unspecified carotid artery: Secondary | ICD-10-CM

## 2018-01-10 DIAGNOSIS — Z23 Encounter for immunization: Secondary | ICD-10-CM | POA: Diagnosis not present

## 2018-01-10 NOTE — Progress Notes (Signed)
BP 130/69   Pulse 70   Temp (!) 96.8 F (36 C) (Oral)   Ht 5\' 10"  (1.778 m)   Wt 188 lb (85.3 kg)   BMI 26.98 kg/m    Subjective:    Patient ID: [name], male    DOB: [birth year], 82 y.o.   MRN: [id number]  HPI: [name] is a 82 y.o. male presenting on 01/10/2018 for lump right axillary (Patient states it has been there a few months and has not grew in size)   HPI Right axillary lymphadenopathy and decreased appetite Lump under right arm and decreased appetite that has been going on over the past few months..  He noticed the lump initially a few months ago and it has not grown in size and actually feels like it has shrunk in size.  It was a little bit sore at first but not sore anymore.  He denies any drainage coming out of it.  He does complain of decreased appetite over the past couple months as well.  Patient denies any lumps anywhere else or any rashes or any other cold or upper respiratory symptoms currently.  Patient does have a history of smoking and has not had a recent chest x-ray  Relevant past medical, surgical, family and social history reviewed and updated as indicated. Interim medical history since our last visit reviewed. Allergies and medications reviewed and updated.  Review of Systems  Constitutional: Positive for appetite change. Negative for activity change, chills, fatigue and fever.  HENT: Negative for congestion, rhinorrhea, sinus pressure, sinus pain and sore throat.   Eyes: Negative for visual disturbance.  Respiratory: Negative for cough, shortness of breath and wheezing.   Cardiovascular: Negative for chest pain and leg swelling.  Musculoskeletal: Negative for back pain and gait problem.  Skin: Negative for rash.  Neurological: Negative for dizziness, light-headedness and headaches.  All other systems reviewed and are negative.   Per HPI unless specifically indicated above   Allergies as of 01/10/2018      Reactions   Lamisil  [terbinafine Hcl] Other (See Comments)   Loss of taste       Medication List        Accurate as of 01/10/18  8:24 AM. Always use your most recent med list.          albuterol 108 (90 Base) MCG/ACT inhaler Commonly known as:  PROVENTIL HFA;VENTOLIN HFA Inhale 2 puffs into the lungs every 6 (six) hours as needed for wheezing or shortness of breath.   aspirin 81 MG tablet Take 81 mg by mouth 2 (two) times daily.   cetirizine 10 MG tablet Commonly known as:  ZYRTEC TAKE 1 TABLET DAILY   Evolocumab 140 MG/ML Soaj Inject 140 mg into the skin every 14 (fourteen) days.   MYRBETRIQ 50 MG Tb24 tablet Generic drug:  mirabegron ER TAKE 1 TABLET DAILY   omeprazole 20 MG capsule Commonly known as:  PRILOSEC TAKE 1 CAPSULE DAILY   ramipril 10 MG capsule Commonly known as:  ALTACE TAKE 1 CAPSULE DAILY   umeclidinium bromide 62.5 MCG/INH Aepb Commonly known as:  INCRUSE ELLIPTA Inhale 1 puff into the lungs daily.   umeclidinium-vilanterol 62.5-25 MCG/INH Aepb Commonly known as:  ANORO ELLIPTA Inhale 1 puff into the lungs daily.   Vitamin D (Ergocalciferol) 50000 units Caps capsule Commonly known as:  DRISDOL TAKE 1 CAPSULE WEEKLY          Objective:    BP 130/69   Pulse 70  Temp (!) 96.8 F (36 C) (Oral)   Ht 5\' 10"  (1.778 m)   Wt 188 lb (85.3 kg)   BMI 26.98 kg/m   Wt Readings from Last 3 Encounters:  01/10/18 188 lb (85.3 kg)  09/27/17 192 lb (87.1 kg)  09/08/17 193 lb (87.5 kg)    Physical Exam  Constitutional: He is oriented to person, place, and time. He appears well-developed and well-nourished. No distress.  Eyes: Conjunctivae are normal. No scleral icterus.  Neck: Neck supple. No thyromegaly present.  Cardiovascular: Normal rate, regular rhythm, normal heart sounds and intact distal pulses.  No murmur heard. Pulmonary/Chest: Effort normal and breath sounds normal. No respiratory distress. He has no wheezes.  Lymphadenopathy:       Head (right  side): No submental, no submandibular, no preauricular, no posterior auricular and no occipital adenopathy present.       Head (left side): No submental, no submandibular, no preauricular, no posterior auricular and no occipital adenopathy present.    He has no cervical adenopathy.       Right cervical: No superficial cervical, no deep cervical and no posterior cervical adenopathy present.      Left cervical: No superficial cervical, no deep cervical and no posterior cervical adenopathy present.    He has axillary adenopathy.       Right axillary: Lateral (Right lateral axillary lymph node, 0.5 cm, mobile, nontender) adenopathy present. No pectoral adenopathy present.       Left axillary: No pectoral and no lateral adenopathy present.      Right: No inguinal and no supraclavicular adenopathy present.       Left: No inguinal and no supraclavicular adenopathy present.  Neurological: He is alert and oriented to person, place, and time. Coordination normal.  Skin: Skin is warm and dry. No rash noted. He is not diaphoretic.  Psychiatric: He has a normal mood and affect. His behavior is normal.  Nursing note and vitals reviewed.   Chest x-ray: No acute abnormalities noted, await final read from radiology    Assessment & Plan:   Problem List Items Addressed This Visit    None    Visit Diagnoses    Axillary lymphadenopathy    -  Primary   Right axilla, mobile and nontender, will watch for a couple more months   Relevant Orders   DG Chest 2 View (Completed)   Decreased appetite       Encounter for immunization       Relevant Orders   Flu vaccine HIGH DOSE PF (Completed)      Nothing on chest x-ray, will watch lymph nodes for a couple more months but if still there then may send for further imaging or biopsy Follow up plan: Return if symptoms worsen or fail to improve.  Counseling provided for all of the vaccine components No orders of the defined types were placed in this  encounter.   Caryl Pina, MD [facility] 01/10/2018, 8:24 AM

## 2018-01-31 ENCOUNTER — Ambulatory Visit (INDEPENDENT_AMBULATORY_CARE_PROVIDER_SITE_OTHER): Payer: Medicare Other | Admitting: Orthopaedic Surgery

## 2018-01-31 ENCOUNTER — Encounter: Payer: Self-pay | Admitting: Orthopaedic Surgery

## 2018-01-31 ENCOUNTER — Ambulatory Visit (INDEPENDENT_AMBULATORY_CARE_PROVIDER_SITE_OTHER): Payer: Medicare Other

## 2018-01-31 VITALS — BP 130/83 | HR 55 | Ht 70.0 in | Wt 190.0 lb

## 2018-01-31 DIAGNOSIS — I6529 Occlusion and stenosis of unspecified carotid artery: Secondary | ICD-10-CM

## 2018-01-31 DIAGNOSIS — M25552 Pain in left hip: Secondary | ICD-10-CM | POA: Diagnosis not present

## 2018-01-31 NOTE — Progress Notes (Signed)
Subjective:    Patient ID: [name], male    DOB: 06/26/1935, 82 y.o.   MRN: [id number]  HPI He has left hip pain that has been getting worse over the last year.  He has more pain after sitting a while and then trying to stand.  He has problems putting on his shoes and socks now for the left foot.  He has pain after walking a distance. He has no trauma, no redness, no numbness. He has not taken anything for it including Tylenol.  He says it is related to aging.  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  He wants to be able to do what he has to do.   Review of Systems  Constitutional: Positive for activity change.  Respiratory: Positive for shortness of breath.   Musculoskeletal: Positive for arthralgias and gait problem.  Allergic/Immunologic: Positive for environmental allergies.  All other systems reviewed and are negative.  For Review of Systems, all other systems reviewed and are negative.  The following is a summary of the past history medically, past history surgically, known current medicines, social history and family history.  This information is gathered electronically by the computer from prior information and documentation.  I review this each visit and have found including this information at this point in the chart is beneficial and informative.   Past Medical History:  Diagnosis Date  . Adenomatous colon polyp 11/1999  . Allergy   . Blood transfusion without reported diagnosis   . Cancer [facility])    Prostate  . Carotid artery occlusion   . Cataract   . COPD (chronic obstructive pulmonary disease) ([place])   . GERD (gastroesophageal reflux disease)   . Hyperlipidemia   . Hypertension   . Internal hemorrhoids   . PVC (premature ventricular contraction)     Past Surgical History:  Procedure Laterality Date  . APPENDECTOMY    . carotid surgery    . CATARACT EXTRACTION     both eyes  . COLONOSCOPY    . POLYPECTOMY    .  TRANSURETHRAL RESECTION OF PROSTATE    . UPPER GASTROINTESTINAL ENDOSCOPY    . UPPER GI ENDOSCOPY  Nov. 3, 2014   Upper endoscopy with diatation    Current Outpatient Medications on File Prior to Visit  Medication Sig Dispense Refill  . albuterol (PROVENTIL HFA;VENTOLIN HFA) 108 (90 Base) MCG/ACT inhaler Inhale 2 puffs into the lungs every 6 (six) hours as needed for wheezing or shortness of breath. 3 Inhaler 3  . aspirin 81 MG tablet Take 81 mg by mouth 2 (two) times daily.     . cetirizine (ZYRTEC) 10 MG tablet TAKE 1 TABLET DAILY 90 tablet 2  . Evolocumab (REPATHA SURECLICK) 458 MG/ML SOAJ Inject 140 mg into the skin every 14 (fourteen) days. 6 pen 3  . MYRBETRIQ 50 MG TB24 tablet TAKE 1 TABLET DAILY 90 tablet 1  . omeprazole (PRILOSEC) 20 MG capsule TAKE 1 CAPSULE DAILY 90 capsule 1  . ramipril (ALTACE) 10 MG capsule TAKE 1 CAPSULE DAILY 90 capsule 1  . umeclidinium bromide (INCRUSE ELLIPTA) 62.5 MCG/INH AEPB Inhale 1 puff into the lungs daily. 90 each 0  . umeclidinium-vilanterol (ANORO ELLIPTA) 62.5-25 MCG/INH AEPB Inhale 1 puff into the lungs daily. 30 each 0  . Vitamin D, Ergocalciferol, (DRISDOL) 50000 units CAPS capsule TAKE 1 CAPSULE WEEKLY 12 capsule 3   Current Facility-Administered Medications on File Prior to Visit  Medication Dose Route  Frequency Provider Last Rate Last Dose  . 0.9 %  sodium chloride infusion  500 mL Intravenous Once Ladene Artist, MD        Social History   Socioeconomic History  . Marital status: Married    Spouse name: Not on file  . Number of children: 2  . Years of education: Not on file  . Highest education level: Not on file  Occupational History  . Not on file  Social Needs  . Financial resource strain: Not on file  . Food insecurity:    Worry: Not on file    Inability: Not on file  . Transportation needs:    Medical: Not on file    Non-medical: Not on file  Tobacco Use  . Smoking status: Former Smoker    Years: 40.00    Types:  Cigarettes    Last attempt to quit: 01/19/1997    Years since quitting: 21.0  . Smokeless tobacco: Never Used  Substance and Sexual Activity  . Alcohol use: No  . Drug use: No  . Sexual activity: Not on file  Lifestyle  . Physical activity:    Days per week: Not on file    Minutes per session: Not on file  . Stress: Not on file  Relationships  . Social connections:    Talks on phone: Not on file    Gets together: Not on file    Attends religious service: Not on file    Active member of club or organization: Not on file    Attends meetings of clubs or organizations: Not on file    Relationship status: Not on file  . Intimate partner violence:    Fear of current or ex partner: Not on file    Emotionally abused: Not on file    Physically abused: Not on file    Forced sexual activity: Not on file  Other Topics Concern  . Not on file  Social History Narrative   Lives with wife.      Family History  Problem Relation Age of Onset  . Colon cancer Mother   . Cancer Mother        colon  . Heart disease Father        Heart Disease before age 27  . Heart attack Father 52  . Arthritis Sister   . Esophageal cancer Neg Hx   . Rectal cancer Neg Hx   . Stomach cancer Neg Hx   . Colon polyps Neg Hx     BP 130/83   Pulse (!) 55   Ht 5\' 10"  (1.778 m)   Wt 190 lb (86.2 kg)   BMI 27.26 kg/m   Body mass index is 27.26 kg/m.     Objective:   Physical Exam  Constitutional: He is oriented to person, place, and time. He appears well-developed and well-nourished.  HENT:  Head: Normocephalic and atraumatic.  Eyes: Pupils are equal, round, and reactive to light. Conjunctivae and EOM are normal.  Neck: Normal range of motion. Neck supple.  Cardiovascular: Normal rate, regular rhythm and intact distal pulses.  Pulmonary/Chest: Effort normal.  Abdominal: Soft.  Musculoskeletal:       Left hip: He exhibits decreased range of motion.       Legs: Neurological: He is alert and  oriented to person, place, and time. He has normal reflexes. He displays normal reflexes. No cranial nerve deficit. He exhibits normal muscle tone. Coordination normal.  Skin: Skin is warm and dry.  Psychiatric: He has a normal mood and affect. His behavior is normal. Judgment and thought content normal.     X-rays were done of the left hip, reported separately.     Assessment & Plan:   Encounter Diagnosis  Name Primary?  . Pain of left hip joint Yes   I have asked that he begin Aleve one bid pc for a week and then to increase to two bid pc. Samples given.  I will see him in one month.  Precautions given. Do not take medicine with his baby aspirin.  Call if any problem.  Precautions discussed.   Electronically Signed Sanjuana Kava, MD 11/13/201910:24 AM

## 2018-02-28 ENCOUNTER — Encounter: Payer: Self-pay | Admitting: Orthopaedic Surgery

## 2018-02-28 ENCOUNTER — Ambulatory Visit (INDEPENDENT_AMBULATORY_CARE_PROVIDER_SITE_OTHER): Payer: Medicare Other | Admitting: Orthopaedic Surgery

## 2018-02-28 VITALS — BP 143/55 | HR 42 | Ht 70.0 in | Wt 190.0 lb

## 2018-02-28 DIAGNOSIS — M25552 Pain in left hip: Secondary | ICD-10-CM

## 2018-02-28 DIAGNOSIS — I6529 Occlusion and stenosis of unspecified carotid artery: Secondary | ICD-10-CM

## 2018-02-28 NOTE — Progress Notes (Signed)
Patient [name], male DOB:June 15, 1935, 82 y.o. PYP:[id number]  Chief Complaint  Patient presents with  . Hip Pain    left    HPI  [name] is a 82 y.o. male who has pain of the left hip from DJD.  He has been taking the Aleve and has also tried Advil.  He likes the Advil better.  He says it does help.  He knows he may need a total hip in the future.  He says the cold rainy days make it worse.  He is able to do most things he wants to do.  He has no new trauma.   Body mass index is 27.26 kg/m.  ROS  Review of Systems  Constitutional: Positive for activity change.  Respiratory: Positive for shortness of breath.   Musculoskeletal: Positive for arthralgias and gait problem.  Allergic/Immunologic: Positive for environmental allergies.  All other systems reviewed and are negative.   All other systems reviewed and are negative.  The following is a summary of the past history medically, past history surgically, known current medicines, social history and family history.  This information is gathered electronically by the computer from prior information and documentation.  I review this each visit and have found including this information at this point in the chart is beneficial and informative.    Past Medical History:  Diagnosis Date  . Adenomatous colon polyp 11/1999  . Allergy   . Blood transfusion without reported diagnosis   . Cancer [facility])    Prostate  . Carotid artery occlusion   . Cataract   . COPD (chronic obstructive pulmonary disease) ([place])   . GERD (gastroesophageal reflux disease)   . Hyperlipidemia   . Hypertension   . Internal hemorrhoids   . PVC (premature ventricular contraction)     Past Surgical History:  Procedure Laterality Date  . APPENDECTOMY    . carotid surgery    . CATARACT EXTRACTION     both eyes  . COLONOSCOPY    . POLYPECTOMY    . TRANSURETHRAL RESECTION OF PROSTATE    . UPPER GASTROINTESTINAL ENDOSCOPY    . UPPER GI  ENDOSCOPY  Nov. 3, 2014   Upper endoscopy with diatation    Family History  Problem Relation Age of Onset  . Colon cancer Mother   . Cancer Mother        colon  . Heart disease Father        Heart Disease before age 72  . Heart attack Father 23  . Arthritis Sister   . Esophageal cancer Neg Hx   . Rectal cancer Neg Hx   . Stomach cancer Neg Hx   . Colon polyps Neg Hx     Social History Social History   Tobacco Use  . Smoking status: Former Smoker    Years: 40.00    Types: Cigarettes    Last attempt to quit: 01/19/1997    Years since quitting: 21.1  . Smokeless tobacco: Never Used  Substance Use Topics  . Alcohol use: No  . Drug use: No    Allergies  Allergen Reactions  . Lamisil [Terbinafine Hcl] Other (See Comments)    Loss of taste     Current Outpatient Medications  Medication Sig Dispense Refill  . albuterol (PROVENTIL HFA;VENTOLIN HFA) 108 (90 Base) MCG/ACT inhaler Inhale 2 puffs into the lungs every 6 (six) hours as needed for wheezing or shortness of breath. 3 Inhaler 3  . aspirin 81 MG tablet Take 81 mg by  mouth 2 (two) times daily.     . cetirizine (ZYRTEC) 10 MG tablet TAKE 1 TABLET DAILY 90 tablet 2  . Evolocumab (REPATHA SURECLICK) 938 MG/ML SOAJ Inject 140 mg into the skin every 14 (fourteen) days. 6 pen 3  . MYRBETRIQ 50 MG TB24 tablet TAKE 1 TABLET DAILY 90 tablet 1  . omeprazole (PRILOSEC) 20 MG capsule TAKE 1 CAPSULE DAILY 90 capsule 1  . ramipril (ALTACE) 10 MG capsule TAKE 1 CAPSULE DAILY 90 capsule 1  . umeclidinium bromide (INCRUSE ELLIPTA) 62.5 MCG/INH AEPB Inhale 1 puff into the lungs daily. 90 each 0  . umeclidinium-vilanterol (ANORO ELLIPTA) 62.5-25 MCG/INH AEPB Inhale 1 puff into the lungs daily. 30 each 0  . Vitamin D, Ergocalciferol, (DRISDOL) 50000 units CAPS capsule TAKE 1 CAPSULE WEEKLY 12 capsule 3   Current Facility-Administered Medications  Medication Dose Route Frequency Provider Last Rate Last Dose  . 0.9 %  sodium chloride  infusion  500 mL Intravenous Once Ladene Artist, MD         Physical Exam  Blood pressure (!) 143/55, pulse (!) 42, height 5\' 10"  (1.778 m), weight 190 lb (86.2 kg).  Constitutional: overall normal hygiene, normal nutrition, well developed, normal grooming, normal body habitus. Assistive device:none  Musculoskeletal: gait and station Limp none, muscle tone and strength are normal, no tremors or atrophy is present.  .  Neurological: coordination overall normal.  Deep tendon reflex/nerve stretch intact.  Sensation normal.  Cranial nerves II-XII intact.   Skin:   Normal overall no scars, lesions, ulcers or rashes. No psoriasis.  Psychiatric: Alert and oriented x 3.  Recent memory intact, remote memory unclear.  Normal mood and affect. Well groomed.  Good eye contact.  Cardiovascular: overall no swelling, no varicosities, no edema bilaterally, normal temperatures of the legs and arms, no clubbing, cyanosis and good capillary refill.  Lymphatic: palpation is normal.  Left hip has decreased internal and external rotation.  NV intact.  He has no limp this morning.  All other systems reviewed and are negative   The patient has been educated about the nature of the problem(s) and counseled on treatment options.  The patient appeared to understand what I have discussed and is in agreement with it.  Encounter Diagnosis  Name Primary?  . Pain of left hip joint Yes    PLAN Call if any problems.  Precautions discussed.  Continue current medications.   Return to clinic as needed.   Electronically Signed Sanjuana Kava, MD 12/11/20198:21 AM

## 2018-03-14 ENCOUNTER — Other Ambulatory Visit: Payer: Self-pay | Admitting: Family Medicine

## 2018-03-26 ENCOUNTER — Other Ambulatory Visit: Payer: Self-pay | Admitting: Family Medicine

## 2018-04-02 ENCOUNTER — Encounter: Payer: Self-pay | Admitting: Family Medicine

## 2018-04-02 ENCOUNTER — Other Ambulatory Visit: Payer: Self-pay

## 2018-04-02 ENCOUNTER — Ambulatory Visit (INDEPENDENT_AMBULATORY_CARE_PROVIDER_SITE_OTHER): Payer: Medicare Other | Admitting: Family Medicine

## 2018-04-02 VITALS — BP 160/68 | HR 50 | Temp 97.5°F | Ht 70.0 in | Wt 189.0 lb

## 2018-04-02 DIAGNOSIS — M48062 Spinal stenosis, lumbar region with neurogenic claudication: Secondary | ICD-10-CM | POA: Diagnosis not present

## 2018-04-02 DIAGNOSIS — N3281 Overactive bladder: Secondary | ICD-10-CM | POA: Diagnosis not present

## 2018-04-02 DIAGNOSIS — I739 Peripheral vascular disease, unspecified: Secondary | ICD-10-CM | POA: Diagnosis not present

## 2018-04-02 DIAGNOSIS — R0602 Shortness of breath: Secondary | ICD-10-CM

## 2018-04-02 DIAGNOSIS — R0609 Other forms of dyspnea: Secondary | ICD-10-CM | POA: Diagnosis not present

## 2018-04-02 DIAGNOSIS — I10 Essential (primary) hypertension: Secondary | ICD-10-CM | POA: Diagnosis not present

## 2018-04-02 DIAGNOSIS — Z8546 Personal history of malignant neoplasm of prostate: Secondary | ICD-10-CM

## 2018-04-02 DIAGNOSIS — E785 Hyperlipidemia, unspecified: Secondary | ICD-10-CM | POA: Diagnosis not present

## 2018-04-02 DIAGNOSIS — J439 Emphysema, unspecified: Secondary | ICD-10-CM | POA: Diagnosis not present

## 2018-04-02 MED ORDER — MIRABEGRON ER 50 MG PO TB24: 50.0000 mg | ORAL_TABLET | Freq: Every day | ORAL | 3 refills | Status: DC

## 2018-04-02 MED ORDER — OMEPRAZOLE 20 MG PO CPDR: 20.0000 mg | DELAYED_RELEASE_CAPSULE | Freq: Every day | ORAL | 3 refills | Status: DC

## 2018-04-02 MED ORDER — RAMIPRIL 10 MG PO CAPS: 10.0000 mg | ORAL_CAPSULE | Freq: Every day | ORAL | 3 refills | Status: DC

## 2018-04-02 NOTE — Progress Notes (Signed)
BP (!) 160/68   Pulse (!) 50   Temp (!) 97.5 F (36.4 C) (Oral)   Ht _0  (1.778 m)   Wt 189 lb (85.7 kg)   SpO2 93%   BMI 27.12 kg/m    Subjective:    Patient ID: [name], male    DOB: February 27, 1936, 83 y.o.   MRN: [id number]  HPI: [name] is a 83 y.o. male presenting on 04/02/2018 for Hypertension (6 month follow up); Hyperlipidemia; Shortness of Breath (x 6 month and patient states it has gotten worse. Patient states any kind of mild exersize he bilateral leg aching.); and Fatigue   HPI Hyperlipidemia Patient is coming in for recheck of his hyperlipidemia. The patient is currently taking Repatha. They deny any issues with myalgias or history of liver damage from it. They deny any focal numbness or weakness or chest pain.   Hypertension Patient is currently on ramipril, and their blood pressure today is 160/68. Patient denies any lightheadedness or dizziness. Patient denies headaches, blurred vision, chest pains, shortness of breath, or weakness. Denies any side effects from medication and is content with current medication.   COPD Patient is coming in for COPD recheck today.  He is currently on Anoro and has not noticed that has helped significantly.  He has a mild chronic cough but denies any major coughing spells or wheezing spells.  He has 0nighttime symptoms per week and 7daytime symptoms per week currently.   Muscle aches and pains, more on exertion and also shortness of breath on exertion have been increasing over the past 6 months.  We had increased his COPD medications to see if they would help with that and his was to get with a cardiologist which she has still not had the opportunity to do yet we will get him in with an appointment through the cardiologist to see if we can help with that.  With the muscle aches and pains we have also done CK previous and had positive CK.  Relevant past medical, surgical, family and social history reviewed and updated as  indicated. Interim medical history since our last visit reviewed. Allergies and medications reviewed and updated.  Review of Systems  Constitutional: Negative for chills and fever.  Eyes: Negative for visual disturbance.  Respiratory: Positive for shortness of breath. Negative for wheezing.   Cardiovascular: Positive for palpitations. Negative for chest pain and leg swelling.  Musculoskeletal: Positive for myalgias. Negative for back pain and gait problem.  Skin: Negative for rash.  All other systems reviewed and are negative.   Per HPI unless specifically indicated above   Allergies as of 04/02/2018      Reactions   Lamisil [terbinafine Hcl] Other (See Comments)   Loss of taste       Medication List       Accurate as of April 02, 2018  8:40 AM. Always use your most recent med list.        albuterol 108 (90 Base) MCG/ACT inhaler Commonly known as:  PROVENTIL HFA;VENTOLIN HFA Inhale 2 puffs into the lungs every 6 (six) hours as needed for wheezing or shortness of breath.   aspirin 81 MG tablet Take 81 mg by mouth 2 (two) times daily.   cetirizine 10 MG tablet Commonly known as:  ZYRTEC TAKE 1 TABLET DAILY   Evolocumab 140 MG/ML Soaj Commonly known as:  REPATHA SURECLICK Inject 599 mg into the skin every 14 (fourteen) days.   mirabegron ER 50 MG Tb24 tablet Commonly  known as:  MYRBETRIQ Take 1 tablet (50 mg total) by mouth daily.   omeprazole 20 MG capsule Commonly known as:  PRILOSEC Take 1 capsule (20 mg total) by mouth daily.   ramipril 10 MG capsule Commonly known as:  ALTACE Take 1 capsule (10 mg total) by mouth daily.   umeclidinium-vilanterol 62.5-25 MCG/INH Aepb Commonly known as:  ANORO ELLIPTA Inhale 1 puff into the lungs daily.   Vitamin D (Ergocalciferol) 1.25 MG (50000 UT) Caps capsule Commonly known as:  DRISDOL TAKE 1 CAPSULE WEEKLY          Objective:    BP (!) 160/68   Pulse (!) 50   Temp (!) 97.5 F (36.4 C) (Oral)   Ht _0   (1.778 m)   Wt 189 lb (85.7 kg)   SpO2 93%   BMI 27.12 kg/m   Wt Readings from Last 3 Encounters:  04/02/18 189 lb (85.7 kg)  02/28/18 190 lb (86.2 kg)  01/31/18 190 lb (86.2 kg)    Physical Exam Vitals signs and nursing note reviewed.  Constitutional:      General: He is not in acute distress.    Appearance: He is well-developed. He is not diaphoretic.  Eyes:     General: No scleral icterus.       Right eye: No discharge.     Conjunctiva/sclera: Conjunctivae normal.     Pupils: Pupils are equal, round, and reactive to light.  Neck:     Musculoskeletal: Neck supple.     Thyroid: No thyromegaly.  Cardiovascular:     Rate and Rhythm: Normal rate and regular rhythm.     Heart sounds: Normal heart sounds. No murmur.  Pulmonary:     Effort: Pulmonary effort is normal. No respiratory distress.     Breath sounds: Normal breath sounds. No wheezing.  Musculoskeletal: Normal range of motion.  Lymphadenopathy:     Cervical: No cervical adenopathy.  Skin:    General: Skin is warm and dry.     Findings: No rash.  Neurological:     Mental Status: He is alert and oriented to person, place, and time.     Coordination: Coordination normal.  Psychiatric:        Behavior: Behavior normal.         Assessment & Plan:   Problem List Items Addressed This Visit      Cardiovascular and Mediastinum   HTN (hypertension), benign   Relevant Medications   ramipril (ALTACE) 10 MG capsule   Other Relevant Orders   CMP14+EGFR     Respiratory   COPD (chronic obstructive pulmonary disease) ([place])   Relevant Orders   CBC with Differential/Platelet     Genitourinary   Overactive bladder   Relevant Medications   mirabegron ER (MYRBETRIQ) 50 MG TB24 tablet   Other Relevant Orders   PSA, total and free     Other   History of prostate cancer   Relevant Orders   PSA, total and free   Hyperlipidemia LDL goal <100 - Primary   Relevant Medications   ramipril (ALTACE) 10 MG capsule    Other Relevant Orders   Lipid panel   DOE (dyspnea on exertion)   Relevant Orders   CBC with Differential/Platelet    Other Visit Diagnoses    Intermittent claudication ([place])       Relevant Orders   MR LUMBAR SPINE WO CONTRAST   Pseudoclaudication syndrome       Relevant Medications   ramipril (ALTACE) 10  MG capsule   Other Relevant Orders   MR LUMBAR SPINE WO CONTRAST       Follow up plan: Return in about 3 months (around 07/02/2018), or if symptoms worsen or fail to improve, for Recheck hypertension and shortness of breath.  Counseling provided for all of the vaccine components Orders Placed This Encounter  Procedures  . MR LUMBAR SPINE WO CONTRAST  . CMP14+EGFR  . CBC with Differential/Platelet  . Lipid panel  . PSA, total and free    Caryl Pina, MD [facility] 04/02/2018, 8:40 AM

## 2018-04-03 LAB — CMP14+EGFR
A/G RATIO: 1.9 (ref 1.2–2.2)
ALT: 16 IU/L (ref 0–44)
AST: 14 IU/L (ref 0–40)
Albumin: 4.1 g/dL (ref 3.5–4.7)
Alkaline Phosphatase: 86 IU/L (ref 39–117)
BUN / CREAT RATIO: 9 — AB (ref 10–24)
BUN: 11 mg/dL (ref 8–27)
Bilirubin Total: 0.2 mg/dL (ref 0.0–1.2)
CALCIUM: 9.3 mg/dL (ref 8.6–10.2)
CO2: 22 mmol/L (ref 20–29)
Chloride: 106 mmol/L (ref 96–106)
Creatinine, Ser: 1.17 mg/dL (ref 0.76–1.27)
GFR calc Af Amer: 68 mL/min/{1.73_m2} (ref >59)
GFR calc non Af Amer: 59 mL/min/{1.73_m2} — ABNORMAL LOW (ref >59)
Globulin, Total: 2.2 g/dL (ref 1.5–4.5)
Glucose: 119 mg/dL — ABNORMAL HIGH (ref 65–99)
Potassium: 4.6 mmol/L (ref 3.5–5.2)
SODIUM: 141 mmol/L (ref 134–144)
TOTAL PROTEIN: 6.3 g/dL (ref 6.0–8.5)

## 2018-04-03 LAB — CBC WITH DIFFERENTIAL/PLATELET
Basophils Absolute: 0.1 10*3/uL (ref 0.0–0.2)
Basos: 1 %
EOS (ABSOLUTE): 0.4 10*3/uL (ref 0.0–0.4)
EOS: 5 %
HEMATOCRIT: 34.6 % — AB (ref 37.5–51.0)
Hemoglobin: 10.1 g/dL — ABNORMAL LOW (ref 13.0–17.7)
Immature Grans (Abs): 0 10*3/uL (ref 0.0–0.1)
Immature Granulocytes: 0 %
LYMPHS ABS: 2 10*3/uL (ref 0.7–3.1)
Lymphs: 26 %
MCH: 23.3 pg — AB (ref 26.6–33.0)
MCHC: 29.2 g/dL — AB (ref 31.5–35.7)
MCV: 80 fL (ref 79–97)
MONOS ABS: 0.7 10*3/uL (ref 0.1–0.9)
Monocytes: 9 %
NEUTROS ABS: 4.5 10*3/uL (ref 1.4–7.0)
Neutrophils: 59 %
Platelets: 260 10*3/uL (ref 150–450)
RBC: 4.34 x10E6/uL (ref 4.14–5.80)
RDW: 15.6 % — AB (ref 11.6–15.4)
WBC: 7.7 10*3/uL (ref 3.4–10.8)

## 2018-04-03 LAB — LIPID PANEL
CHOL/HDL RATIO: 2.4 ratio (ref 0.0–5.0)
Cholesterol, Total: 87 mg/dL — ABNORMAL LOW (ref 100–199)
HDL: 37 mg/dL — ABNORMAL LOW (ref >39)
LDL Calculated: 22 mg/dL (ref 0–99)
TRIGLYCERIDES: 139 mg/dL (ref 0–149)
VLDL Cholesterol Cal: 28 mg/dL (ref 5–40)

## 2018-04-03 LAB — PSA, TOTAL AND FREE

## 2018-04-04 ENCOUNTER — Encounter: Payer: Self-pay | Admitting: Cardiology

## 2018-04-04 NOTE — Progress Notes (Addendum)
Cardiology Office Note   Date:  04/05/2018   ID:  [name], DOB 16-Aug-1935, MRN [id number]  PCP:  Dettinger, Fransisca Kaufmann, MD  Cardiologist:   Minus Breeding, MD  Referring:  Dettinger, Fransisca Kaufmann, MD   Chief Complaint  Patient presents with  . Shortness of Breath     History of Present Illness: [name] is a 83 y.o. male who presents for evaluation of dyslipidemia.  He's had this for some time and has been intolerant of all statins. He gets diffuse muscle aches. He does have peripheral vascular disease.  He previously saw Dr. Mare Ferrari once in 2015 for bradycardia.  He had NSR with PVCs/bigeminy.  He apparently did no perfuse the PVCs.  He has had CEA in the past.   He's had otherwise no significant past cardiac history. He's had negative stress testing some years ago.  He did have an essentially normal echo in 2017. He returns for one year follow up.   Recently he has developed leg pain with walking.  This happens when he walks 35 feet up is inclined yard.  His legs will hurt from his buttocks and into his calves.  He gets short of breath with this.  He had not been having this before.  He stops what he is doing and he will improve over a few minutes.  He does not describe chest pressure, neck or arm discomfort.  He does not report any palpitations, presyncope or syncope.  Of note he was not taking Lasix before but he started taking this which was prescribed previously.  He does not know that that is helped.  He did see his primary provider and is going to have an MRI for evaluation of right leg pain.   Past Medical History:  Diagnosis Date  . Adenomatous colon polyp 11/1999  . Allergy   . Blood transfusion without reported diagnosis   . Cancer [facility])    Prostate  . Carotid artery occlusion   . Cataract   . COPD (chronic obstructive pulmonary disease) ([place])   . GERD (gastroesophageal reflux disease)   . Hyperlipidemia   . Hypertension   . Internal hemorrhoids   . PVC  (premature ventricular contraction)     Past Surgical History:  Procedure Laterality Date  . APPENDECTOMY    . carotid surgery    . CATARACT EXTRACTION     both eyes  . COLONOSCOPY    . POLYPECTOMY    . TRANSURETHRAL RESECTION OF PROSTATE    . UPPER GASTROINTESTINAL ENDOSCOPY    . UPPER GI ENDOSCOPY  Nov. 3, 2014   Upper endoscopy with diatation     Current Outpatient Medications  Medication Sig Dispense Refill  . albuterol (PROVENTIL HFA;VENTOLIN HFA) 108 (90 Base) MCG/ACT inhaler Inhale 2 puffs into the lungs every 6 (six) hours as needed for wheezing or shortness of breath. 3 Inhaler 3  . aspirin 81 MG tablet Take 81 mg by mouth 2 (two) times daily.     . cetirizine (ZYRTEC) 10 MG tablet TAKE 1 TABLET DAILY 90 tablet 1  . Evolocumab (REPATHA SURECLICK) 950 MG/ML SOAJ Inject 140 mg into the skin every 14 (fourteen) days. 6 pen 3  . furosemide (LASIX) 20 MG tablet Take 20 mg by mouth.    . mirabegron ER (MYRBETRIQ) 50 MG TB24 tablet Take 1 tablet (50 mg total) by mouth daily. 90 tablet 3  . omeprazole (PRILOSEC) 20 MG capsule Take 1 capsule (20 mg total) by mouth  daily. 90 capsule 3  . ramipril (ALTACE) 10 MG capsule Take 1 capsule (10 mg total) by mouth daily. 90 capsule 3  . umeclidinium-vilanterol (ANORO ELLIPTA) 62.5-25 MCG/INH AEPB Inhale 1 puff into the lungs daily. 30 each 0  . Vitamin D, Ergocalciferol, (DRISDOL) 50000 units CAPS capsule TAKE 1 CAPSULE WEEKLY 12 capsule 3   Current Facility-Administered Medications  Medication Dose Route Frequency Provider Last Rate Last Dose  . 0.9 %  sodium chloride infusion  500 mL Intravenous Once Ladene Artist, MD        Allergies:   Joen Laura hcl]    ROS:  Please see the history of present illness.   Otherwise, review of systems are positive for none.   All other systems are reviewed and negative.    PHYSICAL EXAM: VS:  BP (!) 150/56   Pulse 81   Ht 5\' 10"  (1.778 m)   Wt 189 lb 6.4 oz (85.9 kg)   BMI 27.18  kg/m  , BMI Body mass index is 27.18 kg/m.  GENERAL:  Well appearing NECK:  No jugular venous distention, waveform within normal limits, carotid upstroke brisk and symmetric, no bruits, no thyromegaly LUNGS:  Clear to auscultation bilaterally CHEST:  Unremarkable HEART:  PMI not displaced or sustained,S1 and S2 within normal limits, no S3, no S4, no clicks, no rubs, no murmurs ABD:  Flat, positive bowel sounds normal in frequency in pitch, no bruits, no rebound, no guarding, no midline pulsatile mass, no hepatomegaly, no splenomegaly EXT:  2 plus pulses throughout, no edema, no cyanosis no clubbing   EKG:  EKG is  ordered today. The ekg ordered 04/05/17 demonstrates sinus rhythm, rate 81axis within normal limits, intervals within normal limits, premature ventricular contraction.  PVCs.    Recent Labs: 06/12/2017: Magnesium 2.4; TSH 4.120 04/02/2018: ALT 16; BUN 11; Creatinine, Ser 1.17; Hemoglobin 10.1; Platelets 260; Potassium 4.6; Sodium 141    Lipid Panel    Component Value Date/Time   CHOL 87 (L) 04/02/2018 0846   TRIG 139 04/02/2018 0846   HDL 37 (L) 04/02/2018 0846   CHOLHDL 2.4 04/02/2018 0846   LDLCALC 22 04/02/2018 0846      Wt Readings from Last 3 Encounters:  04/05/18 189 lb 6.4 oz (85.9 kg)  04/02/18 189 lb (85.7 kg)  02/28/18 190 lb (86.2 kg)      Other studies Reviewed: Additional studies/ records that were reviewed today include: None Review of the above records demonstrates:     ASSESSMENT AND PLAN:  SOB: Shortness of breath certainly could be an anginal equivalent.  I am going to start with stress testing.  He would not be able walk on a treadmill.  He will have a [employer].  CAROTID STENOSIS:   This is followed by [facility].  he is up to date with follow up and has an appt with [facility].   DYSLIPIDEMIA:   He is done well with Repatha.  He is intolerant of statins.  HTN:  The blood pressure is elevated.  He needs to keep a blood pressure diary.  He will  likely need titration of his medications and I like to review his blood pressures when he comes back.   PVCs: He is not bothered by these.  These have been chronic.  No change in therapy.  LEG PAIN: He has normal peripheral pulses.  I agree with attempts at MRI.  He said he is comfortable phobic.  I do not suspect that this is vascular.  Current medicines are reviewed at length with the patient today.  The patient does not have concerns regarding medicines.  The following changes have been made:  None  Labs/ tests ordered today include:   Orders Placed This Encounter  Procedures  . MYOCARDIAL PERFUSION IMAGING  . EKG 12-Lead     Disposition:   FU with me in after the above testing.   Signed, Minus Breeding, MD  04/05/2018 10:55 PM    [place]

## 2018-04-05 ENCOUNTER — Encounter: Payer: Self-pay | Admitting: Cardiology

## 2018-04-05 ENCOUNTER — Ambulatory Visit (INDEPENDENT_AMBULATORY_CARE_PROVIDER_SITE_OTHER): Payer: Medicare Other | Admitting: Cardiology

## 2018-04-05 VITALS — BP 150/56 | HR 81 | Ht 70.0 in | Wt 189.4 lb

## 2018-04-05 DIAGNOSIS — I10 Essential (primary) hypertension: Secondary | ICD-10-CM

## 2018-04-05 DIAGNOSIS — R0602 Shortness of breath: Secondary | ICD-10-CM | POA: Diagnosis not present

## 2018-04-05 DIAGNOSIS — E785 Hyperlipidemia, unspecified: Secondary | ICD-10-CM

## 2018-04-05 DIAGNOSIS — M79606 Pain in leg, unspecified: Secondary | ICD-10-CM | POA: Diagnosis not present

## 2018-04-05 NOTE — Patient Instructions (Addendum)
Medication Instructions:  Continue current medications  If you need a refill on your cardiac medications before your next appointment, please call your pharmacy.  Labwork: None Ordered   Take the provided lab slips with you to the lab for your blood draw.  When you have your labs (blood work) drawn today and your tests are completely normal, you will receive your results only by MyChart Message (if you have MyChart) -OR-  A paper copy in the mail.  If you have any lab test that is abnormal or we need to change your treatment, we will call you to review these results.  Testing/Procedures: Your physician has requested that you have a lexiscan myoview. For further information please visit [email or web address]. Please follow instruction sheet, as given.  Follow-Up: You will need a follow up appointment in 6 months.  Please call our office  2 months in advance to schedule this appointment.  You may see Dr Percival Spanish  or one of the following Advanced Practice Providers on your designated Care Team:   Rosaria Ferries, PA-C . Jory Sims, DNP, ANP    At [facility], you and your health needs are our priority.  As part of our continuing mission to provide you with exceptional heart care, we have created designated Provider Care Teams.  These Care Teams include your primary Cardiologist (physician) and Advanced Practice Providers (APPs -  Physician Assistants and Nurse Practitioners) who all work together to provide you with the care you need, when you need it.  Thank you for choosing [facility]!!

## 2018-04-06 ENCOUNTER — Other Ambulatory Visit: Payer: Self-pay | Admitting: *Deleted

## 2018-04-06 MED ORDER — FERROUS SULFATE 324 (65 FE) MG PO TBEC: 1.0000 | DELAYED_RELEASE_TABLET | Freq: Every day | ORAL | 5 refills | Status: DC

## 2018-04-10 ENCOUNTER — Ambulatory Visit (HOSPITAL_COMMUNITY)
Admission: RE | Admit: 2018-04-10 | Discharge: 2018-04-10 | Disposition: A | Discharge status: Home / Self Care | Payer: Medicare Other | Source: Ambulatory Visit | Attending: Family Medicine | Admitting: Family Medicine

## 2018-04-10 ENCOUNTER — Other Ambulatory Visit: Payer: Self-pay | Admitting: Family Medicine

## 2018-04-10 DIAGNOSIS — I739 Peripheral vascular disease, unspecified: Secondary | ICD-10-CM | POA: Insufficient documentation

## 2018-04-10 DIAGNOSIS — M4726 Other spondylosis with radiculopathy, lumbar region: Secondary | ICD-10-CM | POA: Diagnosis not present

## 2018-04-10 DIAGNOSIS — M48062 Spinal stenosis, lumbar region with neurogenic claudication: Secondary | ICD-10-CM | POA: Diagnosis not present

## 2018-04-10 DIAGNOSIS — M5116 Intervertebral disc disorders with radiculopathy, lumbar region: Secondary | ICD-10-CM | POA: Diagnosis not present

## 2018-04-10 DIAGNOSIS — N3281 Overactive bladder: Secondary | ICD-10-CM

## 2018-04-10 MED ORDER — RAMIPRIL 10 MG PO CAPS: 10.0000 mg | ORAL_CAPSULE | Freq: Every day | ORAL | 3 refills | Status: DC

## 2018-04-10 MED ORDER — MIRABEGRON ER 50 MG PO TB24
50.0000 mg | ORAL_TABLET | Freq: Every day | ORAL | 3 refills | Status: DC
Start: 2018-04-10 — End: 2019-04-05

## 2018-04-10 NOTE — Telephone Encounter (Signed)
RXs cancelled at [facility] Resubmitted to [place] per Dr Warrick Parisian

## 2018-04-10 NOTE — Telephone Encounter (Signed)
[facility]

## 2018-04-11 ENCOUNTER — Other Ambulatory Visit: Payer: Self-pay

## 2018-04-11 DIAGNOSIS — I739 Peripheral vascular disease, unspecified: Secondary | ICD-10-CM

## 2018-04-12 ENCOUNTER — Telehealth (HOSPITAL_COMMUNITY): Payer: Self-pay

## 2018-04-12 NOTE — Telephone Encounter (Signed)
Encounter complete. 

## 2018-04-17 ENCOUNTER — Ambulatory Visit (HOSPITAL_COMMUNITY)
Admission: RE | Admit: 2018-04-17 | Discharge: 2018-04-17 | Disposition: A | Discharge status: Home / Self Care | Payer: Medicare Other | Source: Ambulatory Visit | Attending: Internal Medicine | Admitting: Internal Medicine

## 2018-04-17 DIAGNOSIS — R0602 Shortness of breath: Secondary | ICD-10-CM | POA: Diagnosis not present

## 2018-04-17 LAB — MYOCARDIAL PERFUSION IMAGING
CHL CUP NUCLEAR SRS: 3
CHL CUP NUCLEAR SSS: 7
NUC STRESS TID: 1.01
Peak HR: 77 {beats}/min
Rest HR: 73 {beats}/min
SDS: 4

## 2018-04-17 MED ORDER — REGADENOSON 0.4 MG/5ML IV SOLN
0.4000 mg | Freq: Once | INTRAVENOUS | Status: AC
Start: 2018-04-17 — End: 2018-04-17
  Administered 2018-04-17: 0.4 mg via INTRAVENOUS

## 2018-04-17 MED ORDER — TECHNETIUM TC 99M TETROFOSMIN IV KIT
30.4000 | PACK | Freq: Once | INTRAVENOUS | Status: AC | PRN
  Administered 2018-04-17: 30.4 via INTRAVENOUS
  Filled 2018-04-17: qty 31

## 2018-04-17 MED ORDER — TECHNETIUM TC 99M TETROFOSMIN IV KIT
9.7000 | PACK | Freq: Once | INTRAVENOUS | Status: AC | PRN
  Administered 2018-04-17: 9.7 via INTRAVENOUS
  Filled 2018-04-17: qty 10

## 2018-04-18 DIAGNOSIS — Z6827 Body mass index (BMI) 27.0-27.9, adult: Secondary | ICD-10-CM | POA: Diagnosis not present

## 2018-04-18 DIAGNOSIS — I10 Essential (primary) hypertension: Secondary | ICD-10-CM | POA: Diagnosis not present

## 2018-04-18 DIAGNOSIS — M48062 Spinal stenosis, lumbar region with neurogenic claudication: Secondary | ICD-10-CM | POA: Diagnosis not present

## 2018-04-29 ENCOUNTER — Other Ambulatory Visit: Payer: Self-pay | Admitting: Family Medicine

## 2018-05-03 ENCOUNTER — Ambulatory Visit: Payer: Medicare Other | Admitting: Cardiology

## 2018-05-03 ENCOUNTER — Encounter

## 2018-05-19 ENCOUNTER — Ambulatory Visit (INDEPENDENT_AMBULATORY_CARE_PROVIDER_SITE_OTHER): Payer: Medicare Other | Admitting: Family Medicine

## 2018-05-19 VITALS — BP 140/71 | HR 63 | Temp 97.1°F | Ht 70.0 in | Wt 190.0 lb

## 2018-05-19 DIAGNOSIS — R2231 Localized swelling, mass and lump, right upper limb: Secondary | ICD-10-CM | POA: Diagnosis not present

## 2018-05-19 NOTE — Progress Notes (Signed)
BP 140/71 (BP Location: Right Arm, Patient Position: Sitting, Cuff Size: Large)   Pulse 63   Temp (!) 97.1 F (36.2 C) (Oral)   Ht 5\' 10"  (1.778 m)   Wt 190 lb (86.2 kg)   BMI 27.26 kg/m    Subjective:    Patient ID: [name], male    DOB: 20-May-1935, 83 y.o.   MRN: [id number]  HPI: [name] is a 83 y.o. male presenting on 05/19/2018 for Mass (lump under right arm. No pain or drainage. Has had it for about 6 months with no major change. )   HPI Right axillary nodule/lump Patient comes in complaining of right axillary lump/nodule that has been there for about 6 months.  He denies any overlying skin changes or redness or warmth.  The nodule has come and gone in size but is always been there.  It has become firmer at times and then softer but it is still always palpable.  He denies any nodules anywhere else or shortness of breath or wheezing.  Relevant past medical, surgical, family and social history reviewed and updated as indicated. Interim medical history since our last visit reviewed. Allergies and medications reviewed and updated.  Review of Systems  Constitutional: Negative for chills and fever.  Respiratory: Negative for shortness of breath and wheezing.   Cardiovascular: Negative for chest pain and leg swelling.  Musculoskeletal: Negative for back pain and gait problem.  Skin: Negative for rash.  All other systems reviewed and are negative.   Per HPI unless specifically indicated above   Allergies as of 05/19/2018      Reactions   Lamisil [terbinafine Hcl] Other (See Comments)   Loss of taste       Medication List       Accurate as of May 19, 2018  8:45 AM. Always use your most recent med list.        albuterol 108 (90 Base) MCG/ACT inhaler Commonly known as:  PROVENTIL HFA;VENTOLIN HFA Inhale 2 puffs into the lungs every 6 (six) hours as needed for wheezing or shortness of breath.   aspirin 81 MG tablet Take 81 mg by mouth 2 (two) times  daily.   cetirizine 10 MG tablet Commonly known as:  ZYRTEC TAKE 1 TABLET DAILY   Evolocumab 140 MG/ML Soaj Commonly known as:  REPATHA SURECLICK Inject 786 mg into the skin every 14 (fourteen) days.   ferrous sulfate 324 (65 Fe) MG Tbec Take 1 tablet (325 mg total) by mouth daily for 30 days.   furosemide 20 MG tablet Commonly known as:  LASIX Take 20 mg by mouth.   mirabegron ER 50 MG Tb24 tablet Commonly known as:  MYRBETRIQ Take 1 tablet (50 mg total) by mouth daily.   omeprazole 20 MG capsule Commonly known as:  PRILOSEC TAKE 1 CAPSULE DAILY   ramipril 10 MG capsule Commonly known as:  ALTACE Take 1 capsule (10 mg total) by mouth daily.   umeclidinium-vilanterol 62.5-25 MCG/INH Aepb Commonly known as:  ANORO ELLIPTA Inhale 1 puff into the lungs daily.   Vitamin D (Ergocalciferol) 1.25 MG (50000 UT) Caps capsule Commonly known as:  DRISDOL TAKE 1 CAPSULE WEEKLY          Objective:    BP 140/71 (BP Location: Right Arm, Patient Position: Sitting, Cuff Size: Large)   Pulse 63   Temp (!) 97.1 F (36.2 C) (Oral)   Ht 5\' 10"  (1.778 m)   Wt 190 lb (86.2 kg)   BMI  27.26 kg/m   Wt Readings from Last 3 Encounters:  05/19/18 190 lb (86.2 kg)  04/17/18 189 lb (85.7 kg)  04/05/18 189 lb 6.4 oz (85.9 kg)    Physical Exam Vitals signs and nursing note reviewed.  Constitutional:      General: He is not in acute distress.    Appearance: He is well-developed. He is not diaphoretic.  Eyes:     General: No scleral icterus.    Conjunctiva/sclera: Conjunctivae normal.  Neck:     Thyroid: No thyromegaly.  Lymphadenopathy:     Upper Body:     Right upper body: Axillary adenopathy (Small mobile right axillary lymph node, softer, could be fatty in nature, mobile, nontender) present.  Skin:    General: Skin is warm and dry.     Findings: No erythema or rash.  Neurological:     Mental Status: He is alert and oriented to person, place, and time.     Coordination:  Coordination normal.  Psychiatric:        Behavior: Behavior normal.         Assessment & Plan:   Problem List Items Addressed This Visit    None    Visit Diagnoses    Axillary lump, right    -  Primary   Relevant Orders   [place] AXILLA RIGHT       Follow up plan: Return if symptoms worsen or fail to improve.  Counseling provided for all of the vaccine components Orders Placed This Encounter  Procedures  . [place] AXILLA RIGHT    Hernan Turnage, MD [facility] 05/19/2018, 8:45 AM

## 2018-05-21 ENCOUNTER — Ambulatory Visit: Payer: Medicare Other | Admitting: Family Medicine

## 2018-05-28 ENCOUNTER — Ambulatory Visit (HOSPITAL_COMMUNITY)
Admission: RE | Admit: 2018-05-28 | Discharge: 2018-05-28 | Disposition: A | Discharge status: Home / Self Care | Payer: Medicare Other | Source: Ambulatory Visit | Attending: Family Medicine | Admitting: Family Medicine

## 2018-05-28 DIAGNOSIS — N6331 Unspecified lump in axillary tail of the right breast: Secondary | ICD-10-CM | POA: Diagnosis not present

## 2018-05-28 DIAGNOSIS — R2231 Localized swelling, mass and lump, right upper limb: Secondary | ICD-10-CM | POA: Diagnosis not present

## 2018-06-06 DIAGNOSIS — L820 Inflamed seborrheic keratosis: Secondary | ICD-10-CM | POA: Diagnosis not present

## 2018-06-06 DIAGNOSIS — L570 Actinic keratosis: Secondary | ICD-10-CM | POA: Diagnosis not present

## 2018-06-06 DIAGNOSIS — X32XXXD Exposure to sunlight, subsequent encounter: Secondary | ICD-10-CM | POA: Diagnosis not present

## 2018-07-02 ENCOUNTER — Ambulatory Visit: Payer: Medicare Other | Admitting: Family Medicine

## 2018-07-16 ENCOUNTER — Other Ambulatory Visit: Payer: Self-pay | Admitting: Family Medicine

## 2018-08-07 ENCOUNTER — Telehealth: Payer: Self-pay | Admitting: Cardiology

## 2018-08-20 ENCOUNTER — Ambulatory Visit: Payer: Medicare Other | Admitting: Family Medicine

## 2018-08-22 NOTE — Telephone Encounter (Signed)
Patient is calling about this refill stating he need this completed and sent to his mail order pharmacy.   REPATHA SURECLICK 419 MG/ML SOAJ

## 2018-08-23 NOTE — Telephone Encounter (Signed)
PA expired 4/3.  [employer] for Life.  He's been getting 90 day supply and has 2 syringes left.  Labs drawn in January

## 2018-08-24 ENCOUNTER — Ambulatory Visit: Payer: Medicare Other | Admitting: Family Medicine

## 2018-08-27 ENCOUNTER — Other Ambulatory Visit: Payer: Self-pay | Admitting: Pharmacist

## 2018-08-27 MED ORDER — EVOLOCUMAB 140 MG/ML ~~LOC~~ SOAJ: 140.0000 mg | SUBCUTANEOUS | 3 refills | Status: DC

## 2018-09-06 ENCOUNTER — Other Ambulatory Visit: Payer: Self-pay

## 2018-09-07 ENCOUNTER — Encounter: Payer: Self-pay | Admitting: Family Medicine

## 2018-09-07 ENCOUNTER — Ambulatory Visit (INDEPENDENT_AMBULATORY_CARE_PROVIDER_SITE_OTHER): Payer: Medicare Other | Admitting: Family Medicine

## 2018-09-07 VITALS — BP 132/60 | HR 50 | Temp 97.2°F | Ht 70.0 in | Wt 190.0 lb

## 2018-09-07 DIAGNOSIS — E785 Hyperlipidemia, unspecified: Secondary | ICD-10-CM

## 2018-09-07 DIAGNOSIS — I10 Essential (primary) hypertension: Secondary | ICD-10-CM | POA: Diagnosis not present

## 2018-09-07 DIAGNOSIS — J439 Emphysema, unspecified: Secondary | ICD-10-CM | POA: Diagnosis not present

## 2018-09-07 MED ORDER — VITAMIN D (ERGOCALCIFEROL) 1.25 MG (50000 UNIT) PO CAPS: 50000.0000 [IU] | ORAL_CAPSULE | ORAL | 3 refills | Status: DC

## 2018-09-07 MED ORDER — FUROSEMIDE 20 MG PO TABS: 20.0000 mg | ORAL_TABLET | Freq: Every day | ORAL | 3 refills | Status: DC

## 2018-09-07 MED ORDER — FERROUS SULFATE 325 (65 FE) MG PO TABS: 325.0000 mg | ORAL_TABLET | Freq: Every day | ORAL | 3 refills | Status: DC

## 2018-09-07 NOTE — Progress Notes (Signed)
 BP 132/60   Pulse (!) 50   Temp (!) 97.2 F (36.2 C) (Oral)   Ht 5' 10" (1.778 m)   Wt 190 lb (86.2 kg)   BMI 27.26 kg/m    Subjective:   Patient ID: [name], male    DOB: 07/30/1935, 82 y.o.   MRN: [id number]  HPI: [name] is a 82 y.o. male presenting on 09/07/2018 for Hyperlipidemia (3 month follow up), Hypertension, and Anorexia   HPI Hypertension Patient is currently on ramipril, and their blood pressure today is 132/60. Patient denies any lightheadedness or dizziness. Patient denies headaches, blurred vision, chest pains, shortness of breath, or weakness. Denies any side effects from medication and is content with current medication.   Hyperlipidemia Patient is coming in for recheck of his hyperlipidemia. The patient is currently taking Repatha. They deny any issues with myalgias or history of liver damage from it. They deny any focal numbness or weakness or chest pain.   COPD Patient is coming in for COPD recheck today.  He is currently on Anoro.  He has a mild chronic cough but denies any major coughing spells or wheezing spells.  He has 0nighttime symptoms per week and 0daytime symptoms per week currently.   Patient is still having decreased appetite and thinks it is likely because he is taking care of his wife who is fighting cancer and not doing well and waning and he has a lot of stress related to that and also because she is not eating he is not cooking as much and she is not cooking either.  Relevant past medical, surgical, family and social history reviewed and updated as indicated. Interim medical history since our last visit reviewed. Allergies and medications reviewed and updated.  Review of Systems  Constitutional: Negative for chills and fever.  Eyes: Negative for visual disturbance.  Respiratory: Negative for shortness of breath and wheezing.   Cardiovascular: Negative for chest pain and leg swelling.  Musculoskeletal: Negative for back pain  and gait problem.  Skin: Negative for rash.  Neurological: Negative for dizziness, weakness and numbness.  All other systems reviewed and are negative.   Per HPI unless specifically indicated above   Allergies as of 09/07/2018      Reactions   Lamisil [terbinafine Hcl] Other (See Comments)   Loss of taste       Medication List       Accurate as of September 07, 2018  1:53 PM. If you have any questions, ask your nurse or doctor.        albuterol 108 (90 Base) MCG/ACT inhaler Commonly known as: VENTOLIN HFA Inhale 2 puffs into the lungs every 6 (six) hours as needed for wheezing or shortness of breath.   aspirin 81 MG tablet Take 81 mg by mouth 2 (two) times daily.   cetirizine 10 MG tablet Commonly known as: ZYRTEC TAKE 1 TABLET DAILY   Evolocumab 140 MG/ML Soaj Commonly known as: Repatha SureClick Inject 140 mg into the skin every 14 (fourteen) days.   ferrous sulfate 325 (65 FE) MG tablet Commonly known as: FeroSul Take 1 tablet (325 mg total) by mouth daily. What changed:   medication strength  how much to take Changed by: Joshua A Dettinger, MD   furosemide 20 MG tablet Commonly known as: LASIX Take 1 tablet (20 mg total) by mouth daily. What changed: when to take this Changed by: Joshua A Dettinger, MD   mirabegron ER 50 MG Tb24 tablet Commonly known as:   Myrbetriq Take 1 tablet (50 mg total) by mouth daily.   omeprazole 20 MG capsule Commonly known as: PRILOSEC TAKE 1 CAPSULE DAILY   ramipril 10 MG capsule Commonly known as: ALTACE Take 1 capsule (10 mg total) by mouth daily.   umeclidinium-vilanterol 62.5-25 MCG/INH Aepb Commonly known as: ANORO ELLIPTA Inhale 1 puff into the lungs daily.   Vitamin D (Ergocalciferol) 1.25 MG (50000 UT) Caps capsule Commonly known as: DRISDOL Take 1 capsule (50,000 Units total) by mouth once a week.        Objective:   BP 132/60   Pulse (!) 50   Temp (!) 97.2 F (36.2 C) (Oral)   Ht 5' 10" (1.778 m)    Wt 190 lb (86.2 kg)   BMI 27.26 kg/m   Wt Readings from Last 3 Encounters:  09/07/18 190 lb (86.2 kg)  05/19/18 190 lb (86.2 kg)  04/17/18 189 lb (85.7 kg)    Physical Exam Vitals signs and nursing note reviewed.  Constitutional:      General: He is not in acute distress.    Appearance: He is well-developed. He is not diaphoretic.  Eyes:     General: No scleral icterus.    Conjunctiva/sclera: Conjunctivae normal.  Neck:     Musculoskeletal: Neck supple.     Thyroid: No thyromegaly.  Cardiovascular:     Rate and Rhythm: Normal rate and regular rhythm.     Heart sounds: Normal heart sounds. No murmur.  Pulmonary:     Effort: Pulmonary effort is normal. No respiratory distress.     Breath sounds: Normal breath sounds. No wheezing.  Musculoskeletal: Normal range of motion.  Lymphadenopathy:     Cervical: No cervical adenopathy.  Skin:    General: Skin is warm and dry.     Findings: No rash.  Neurological:     Mental Status: He is alert and oriented to person, place, and time.     Coordination: Coordination normal.  Psychiatric:        Behavior: Behavior normal.       Assessment & Plan:   Problem List Items Addressed This Visit      Cardiovascular and Mediastinum   HTN (hypertension), benign   Relevant Medications   furosemide (LASIX) 20 MG tablet   Other Relevant Orders   CBC with Differential/Platelet   Essential hypertension   Relevant Medications   furosemide (LASIX) 20 MG tablet   Other Relevant Orders   CMP14+EGFR     Respiratory   COPD (chronic obstructive pulmonary disease) (HCC)   Relevant Orders   CBC with Differential/Platelet     Other   Hyperlipidemia LDL goal <100 - Primary   Relevant Medications   furosemide (LASIX) 20 MG tablet   Other Relevant Orders   CBC with Differential/Platelet   Lipid panel      Continue current medication Follow up plan: Return in about 6 months (around 03/09/2019), or if symptoms worsen or fail to improve,  for Hypertension and cholesterol.  Counseling provided for all of the vaccine components Orders Placed This Encounter  Procedures  . CBC with Differential/Platelet  . CMP14+EGFR  . Lipid panel    Joshua Dettinger, MD [facility] 09/07/2018, 1:53 PM     

## 2018-09-11 ENCOUNTER — Other Ambulatory Visit: Payer: Medicare Other

## 2018-09-11 ENCOUNTER — Other Ambulatory Visit: Payer: Self-pay | Admitting: Family Medicine

## 2018-09-11 ENCOUNTER — Other Ambulatory Visit: Payer: Self-pay

## 2018-09-11 DIAGNOSIS — I10 Essential (primary) hypertension: Secondary | ICD-10-CM | POA: Diagnosis not present

## 2018-09-11 DIAGNOSIS — E785 Hyperlipidemia, unspecified: Secondary | ICD-10-CM | POA: Diagnosis not present

## 2018-09-11 DIAGNOSIS — J439 Emphysema, unspecified: Secondary | ICD-10-CM | POA: Diagnosis not present

## 2018-09-11 LAB — LIPID PANEL

## 2018-09-12 LAB — LIPID PANEL
Chol/HDL Ratio: 3 ratio (ref 0.0–5.0)
Cholesterol, Total: 102 mg/dL (ref 100–199)
HDL: 34 mg/dL — ABNORMAL LOW (ref >39)
LDL Calculated: 11 mg/dL (ref 0–99)
Triglycerides: 287 mg/dL — ABNORMAL HIGH (ref 0–149)
VLDL Cholesterol Cal: 57 mg/dL — ABNORMAL HIGH (ref 5–40)

## 2018-09-12 LAB — CBC WITH DIFFERENTIAL/PLATELET
Basophils Absolute: 0.1 10*3/uL (ref 0.0–0.2)
Basos: 1 %
EOS (ABSOLUTE): 0.6 10*3/uL — ABNORMAL HIGH (ref 0.0–0.4)
Eos: 7 %
Hematocrit: 42.8 % (ref 37.5–51.0)
Hemoglobin: 14.4 g/dL (ref 13.0–17.7)
Immature Grans (Abs): 0.1 10*3/uL (ref 0.0–0.1)
Immature Granulocytes: 1 %
Lymphocytes Absolute: 2.6 10*3/uL (ref 0.7–3.1)
Lymphs: 32 %
MCH: 30.8 pg (ref 26.6–33.0)
MCHC: 33.6 g/dL (ref 31.5–35.7)
MCV: 92 fL (ref 79–97)
Monocytes Absolute: 0.7 10*3/uL (ref 0.1–0.9)
Monocytes: 9 %
Neutrophils Absolute: 4 10*3/uL (ref 1.4–7.0)
Neutrophils: 50 %
Platelets: 181 10*3/uL (ref 150–450)
RBC: 4.68 x10E6/uL (ref 4.14–5.80)
RDW: 14.2 % (ref 11.6–15.4)
WBC: 8 10*3/uL (ref 3.4–10.8)

## 2018-09-12 LAB — CMP14+EGFR
ALT: 20 IU/L (ref 0–44)
AST: 21 IU/L (ref 0–40)
Albumin/Globulin Ratio: 2.2 (ref 1.2–2.2)
Albumin: 4.2 g/dL (ref 3.7–4.7)
Alkaline Phosphatase: 74 IU/L (ref 39–117)
BUN/Creatinine Ratio: 12 (ref 10–24)
BUN: 13 mg/dL (ref 8–27)
Bilirubin Total: 0.3 mg/dL (ref 0.0–1.2)
CO2: 20 mmol/L (ref 20–29)
Calcium: 9.4 mg/dL (ref 8.6–10.2)
Chloride: 107 mmol/L — ABNORMAL HIGH (ref 96–106)
Creatinine, Ser: 1.1 mg/dL (ref 0.76–1.27)
GFR calc Af Amer: 73 mL/min/{1.73_m2} (ref >59)
GFR calc non Af Amer: 63 mL/min/{1.73_m2} (ref >59)
Globulin, Total: 1.9 g/dL (ref 1.5–4.5)
Glucose: 115 mg/dL — ABNORMAL HIGH (ref 65–99)
Potassium: 4.1 mmol/L (ref 3.5–5.2)
Sodium: 142 mmol/L (ref 134–144)
Total Protein: 6.1 g/dL (ref 6.0–8.5)

## 2018-10-31 ENCOUNTER — Other Ambulatory Visit: Payer: Self-pay | Admitting: Family Medicine

## 2018-11-29 ENCOUNTER — Encounter: Payer: Self-pay | Admitting: Family Medicine

## 2018-11-29 ENCOUNTER — Ambulatory Visit (INDEPENDENT_AMBULATORY_CARE_PROVIDER_SITE_OTHER): Payer: Medicare Other | Admitting: Family Medicine

## 2018-11-29 DIAGNOSIS — F5104 Psychophysiologic insomnia: Secondary | ICD-10-CM | POA: Diagnosis not present

## 2018-11-29 MED ORDER — MIRTAZAPINE 30 MG PO TABS: 30.0000 mg | ORAL_TABLET | Freq: Every day | ORAL | 1 refills | Status: DC

## 2018-11-29 NOTE — Progress Notes (Signed)
Virtual Visit via telephone Note  I connected with [name] on 11/29/18 at 0835 by telephone and verified that I am speaking with the correct person using two identifiers. [name] is is frequently currently located at home and no other people are currently with her during visit. The provider, Fransisca Kaufmann Dettinger, MD is located in their office at time of visit.  Call ended at [phone]  I discussed the limitations, risks, security and privacy concerns of performing an evaluation and management service by telephone and the availability of in person appointments. I also discussed with the patient that there may be a patient responsible charge related to this service. The patient expressed understanding and agreed to proceed.   History and Present Illness: Patient is calling in with insomnia and waking up multiple times per night and has worsened and he just wakes up and is sometimes able to fall asleep bu not stay asleep. He is able to fall asleep but just can't stay asleep.  He has tried melatonin without success. 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   No diagnosis found.  Outpatient Encounter Medications as of 11/29/2018  Medication Sig  . albuterol (PROVENTIL HFA;VENTOLIN HFA) 108 (90 Base) MCG/ACT inhaler Inhale 2 puffs into the lungs every 6 (six) hours as needed for wheezing or shortness of breath.  Jearl Klinefelter ELLIPTA 62.5-25 MCG/INH AEPB USE 1 INHALATION DAILY  . aspirin 81 MG tablet Take 81 mg by mouth 2 (two) times daily.   . cetirizine (ZYRTEC) 10 MG tablet TAKE 1 TABLET DAILY  . Evolocumab (REPATHA SURECLICK) XX123456 MG/ML SOAJ Inject 140 mg into the skin every 14 (fourteen) days.  . ferrous sulfate (FEROSUL) 325 (65 FE) MG tablet Take 1 tablet (325 mg total) by mouth daily.  . furosemide (LASIX) 20 MG tablet Take 1 tablet (20 mg total) by mouth daily.  . mirabegron ER (MYRBETRIQ) 50 MG TB24 tablet Take 1 tablet (50 mg  total) by mouth daily.  Marland Kitchen omeprazole (PRILOSEC) 20 MG capsule TAKE 1 CAPSULE DAILY  . ramipril (ALTACE) 10 MG capsule Take 1 capsule (10 mg total) by mouth daily.  . Vitamin D, Ergocalciferol, (DRISDOL) 1.25 MG (50000 UT) CAPS capsule Take 1 capsule (50,000 Units total) by mouth once a week.   Facility-Administered Encounter Medications as of 11/29/2018  Medication  . 0.9 %  sodium chloride infusion    Review of Systems  Constitutional: Negative for chills and fever.  Respiratory: Negative for shortness of breath and wheezing.   Cardiovascular: Negative for chest pain and leg swelling.  Skin: Negative for rash.  Psychiatric/Behavioral: Positive for dysphoric mood and sleep disturbance. Negative for decreased concentration, self-injury and suicidal ideas. The patient is nervous/anxious.   All other systems reviewed and are negative.   Observations/Objective: Patient sounds comfortable and in no acute distress  Assessment and Plan: Problem List Items Addressed This Visit    None    Visit Diagnoses    Psychophysiological insomnia    -  Primary   Relevant Medications   mirtazapine (REMERON) 30 MG tablet       Follow Up Instructions: Follow up in 3 months    I discussed the assessment and treatment plan with the patient. The patient was provided an opportunity to ask questions and all were answered. The patient agreed with the plan and demonstrated an understanding of the instructions.   The patient was advised to call back  or seek an in-person evaluation if the symptoms worsen or if the condition fails to improve as anticipated.  The above assessment and management plan was discussed with the patient. The patient verbalized understanding of and has agreed to the management plan. Patient is aware to call the clinic if symptoms persist or worsen. Patient is aware when to return to the clinic for a follow-up visit. Patient educated on when it is appropriate to go to the emergency  department.    I provided 21 minutes of non-face-to-face time during this encounter.    Worthy Rancher, MD

## 2018-12-21 ENCOUNTER — Encounter: Payer: Self-pay | Admitting: Family Medicine

## 2018-12-21 ENCOUNTER — Ambulatory Visit (INDEPENDENT_AMBULATORY_CARE_PROVIDER_SITE_OTHER): Payer: Medicare Other | Admitting: Family Medicine

## 2018-12-21 DIAGNOSIS — F5104 Psychophysiologic insomnia: Secondary | ICD-10-CM | POA: Diagnosis not present

## 2018-12-21 MED ORDER — QUETIAPINE FUMARATE 50 MG PO TABS: 50.0000 mg | ORAL_TABLET | Freq: Every day | ORAL | 1 refills | Status: DC

## 2018-12-21 NOTE — Progress Notes (Signed)
Virtual Visit via telephone Note  I connected with [name] on 12/21/18 at 0800 by telephone and verified that I am speaking with the correct person using two identifiers. [name] is currently located at home and no other people are currently with her during visit. The provider, Fransisca Kaufmann Ezekiah Massie, MD is located in their office at time of visit.  Call ended at 0810  I discussed the limitations, risks, security and privacy concerns of performing an evaluation and management service by telephone and the availability of in person appointments. I also discussed with the patient that there may be a patient responsible charge related to this service. The patient expressed understanding and agreed to proceed.   History and Present Illness: Patient is calling in for insomnia and he tried Remeron and it didn't work at all, he did not have any side effects but it just did not really work at all.  He denies any suicidal ideations.  A lot of his insomnia and anxiety come from the recent passing of his beloved spouse  No diagnosis found.  Outpatient Encounter Medications as of 12/21/2018  Medication Sig  . albuterol (PROVENTIL HFA;VENTOLIN HFA) 108 (90 Base) MCG/ACT inhaler Inhale 2 puffs into the lungs every 6 (six) hours as needed for wheezing or shortness of breath.  Jearl Klinefelter ELLIPTA 62.5-25 MCG/INH AEPB USE 1 INHALATION DAILY  . aspirin 81 MG tablet Take 81 mg by mouth 2 (two) times daily.   . cetirizine (ZYRTEC) 10 MG tablet TAKE 1 TABLET DAILY  . Evolocumab (REPATHA SURECLICK) XX123456 MG/ML SOAJ Inject 140 mg into the skin every 14 (fourteen) days.  . ferrous sulfate (FEROSUL) 325 (65 FE) MG tablet Take 1 tablet (325 mg total) by mouth daily.  . furosemide (LASIX) 20 MG tablet Take 1 tablet (20 mg total) by mouth daily.  . mirabegron ER (MYRBETRIQ) 50 MG TB24 tablet Take 1 tablet (50 mg total) by mouth daily.  . mirtazapine (REMERON) 30 MG tablet Take 1 tablet (30 mg total) by mouth at  bedtime.  Marland Kitchen omeprazole (PRILOSEC) 20 MG capsule TAKE 1 CAPSULE DAILY  . ramipril (ALTACE) 10 MG capsule Take 1 capsule (10 mg total) by mouth daily.  . Vitamin D, Ergocalciferol, (DRISDOL) 1.25 MG (50000 UT) CAPS capsule Take 1 capsule (50,000 Units total) by mouth once a week.   Facility-Administered Encounter Medications as of 12/21/2018  Medication  . 0.9 %  sodium chloride infusion    Review of Systems  Constitutional: Negative for chills and fever.  Respiratory: Negative for shortness of breath and wheezing.   Cardiovascular: Negative for chest pain and leg swelling.  Musculoskeletal: Negative for back pain and gait problem.  Skin: Negative for rash.  Psychiatric/Behavioral: Positive for dysphoric mood and sleep disturbance. The patient is nervous/anxious.   All other systems reviewed and are negative.   Observations/Objective: Patient sounds comfortable and in no acute distress  Assessment and Plan: Problem List Items Addressed This Visit    None    Visit Diagnoses    Psychophysiological insomnia    -  Primary   Relevant Medications   QUEtiapine (SEROQUEL) 50 MG tablet       Follow Up Instructions: Follow up in 4 weeks for insomnia Stop the Remeron and will try Seroquel to see if it helps calm his nerves and help with sleep.  Recommended counseling as well.   I discussed the assessment and treatment plan with the patient. The patient was provided an opportunity to ask questions  and all were answered. The patient agreed with the plan and demonstrated an understanding of the instructions.   The patient was advised to call back or seek an in-person evaluation if the symptoms worsen or if the condition fails to improve as anticipated.  The above assessment and management plan was discussed with the patient. The patient verbalized understanding of and has agreed to the management plan. Patient is aware to call the clinic if symptoms persist or worsen. Patient is aware when  to return to the clinic for a follow-up visit. Patient educated on when it is appropriate to go to the emergency department.    I provided 10 minutes of non-face-to-face time during this encounter.    Worthy Rancher, MD

## 2019-01-16 ENCOUNTER — Other Ambulatory Visit: Payer: Self-pay

## 2019-01-17 ENCOUNTER — Ambulatory Visit (INDEPENDENT_AMBULATORY_CARE_PROVIDER_SITE_OTHER): Payer: Medicare Other

## 2019-01-17 DIAGNOSIS — Z23 Encounter for immunization: Secondary | ICD-10-CM | POA: Diagnosis not present

## 2019-01-22 NOTE — Progress Notes (Signed)
Cardiology Office Note   Date:  01/23/2019   ID:  [name], DOB Jul 06, 1935, MRN [id number]  PCP:  Dettinger, Fransisca Kaufmann, MD  Cardiologist:   Minus Breeding, MD    Chief Complaint  Patient presents with  . Shortness of Breath     History of Present Illness: [name] is a 83 y.o. male who presents for evaluation of dyslipidemia and bradycardia.    He's intolerant of all statins. He gets diffuse muscle aches. He does have peripheral vascular disease.  He previously saw Dr. Mare Ferrari once in 2015 for bradycardia.  He had NSR with PVCs/bigeminy.  He apparently did no perfuse the PVCs.  He has had CEA in the past.  He's had negative stress testing some years ago.  He did have an essentially normal echo in 2017.  At the last visit he had SOB and he had negative Lexiscan Myoview.     Since I last saw him his wife of 59 years died from leukemia.  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  He has not had any new cardiovascular complaints.  He has had some arthritis in his hands.  He still has premature beats but he only notices these extremities taking his blood pressure his heart rate.  He does not really feel these.  He was having some pain in his back and legs and he did get an MRI as described below.  He thinks this is somewhat better.  He is not required any surgery and he has some spondylosis.   Past Medical History:  Diagnosis Date  . Adenomatous colon polyp 11/1999  . Allergy   . Blood transfusion without reported diagnosis   . Cancer [facility])    Prostate  . Carotid artery occlusion   . Cataract   . COPD (chronic obstructive pulmonary disease) ([place])   . GERD (gastroesophageal reflux disease)   . Hyperlipidemia   . Hypertension   . Internal hemorrhoids   . PVC (premature ventricular contraction)     Past Surgical History:  Procedure Laterality Date  . APPENDECTOMY    . carotid surgery    . CATARACT EXTRACTION     both eyes  . COLONOSCOPY    .  POLYPECTOMY    . TRANSURETHRAL RESECTION OF PROSTATE    . UPPER GASTROINTESTINAL ENDOSCOPY    . UPPER GI ENDOSCOPY  Nov. 3, 2014   Upper endoscopy with diatation     Current Outpatient Medications  Medication Sig Dispense Refill  . albuterol (PROVENTIL HFA;VENTOLIN HFA) 108 (90 Base) MCG/ACT inhaler Inhale 2 puffs into the lungs every 6 (six) hours as needed for wheezing or shortness of breath. 3 Inhaler 3  . ANORO ELLIPTA 62.5-25 MCG/INH AEPB USE 1 INHALATION DAILY 180 each 3  . aspirin 81 MG tablet Take 81 mg by mouth 2 (two) times daily.     . cetirizine (ZYRTEC) 10 MG tablet TAKE 1 TABLET DAILY 90 tablet 3  . Evolocumab (REPATHA SURECLICK) [id number] MG/ML SOAJ Inject 140 mg into the skin every 14 (fourteen) days. 6 mL 3  . ferrous sulfate (FEROSUL) 325 (65 FE) MG tablet Take 1 tablet (325 mg total) by mouth daily. 90 tablet 3  . furosemide (LASIX) 20 MG tablet Take 1 tablet (20 mg total) by mouth daily. 90 tablet 3  . mirabegron ER (MYRBETRIQ) 50 MG TB24 tablet Take 1 tablet (50 mg total) by mouth daily. 90 tablet 3  . omeprazole (PRILOSEC) 20 MG capsule  TAKE 1 CAPSULE DAILY 90 capsule 1  . QUEtiapine (SEROQUEL) 50 MG tablet Take 1 tablet (50 mg total) by mouth at bedtime. 30 tablet 1  . ramipril (ALTACE) 10 MG capsule Take 1 capsule (10 mg total) by mouth daily. 90 capsule 3  . Vitamin D, Ergocalciferol, (DRISDOL) 1.25 MG (50000 UT) CAPS capsule Take 1 capsule (50,000 Units total) by mouth once a week. 12 capsule 3   Current Facility-Administered Medications  Medication Dose Route Frequency Provider Last Rate Last Dose  . 0.9 %  sodium chloride infusion  500 mL Intravenous Once Ladene Artist, MD        Allergies:   Joen Laura hcl]    ROS:  Please see the history of present illness.   Otherwise, review of systems are positive for none.   All other systems are reviewed and negative.    PHYSICAL EXAM: VS:  BP 120/70   Pulse 72   Ht 5\' 10"  (1.778 m)   Wt 192 lb (87.1 kg)    BMI 27.55 kg/m  , BMI Body mass index is 27.55 kg/m.  GENERAL:  Well appearing NECK:  No jugular venous distention, waveform within normal limits, carotid upstroke brisk and symmetric, no bruits, no thyromegaly LUNGS:  Clear to auscultation bilaterally CHEST:  Unremarkable HEART:  PMI not displaced or sustained,S1 and S2 within normal limits, no S3, no S4, no clicks, no rubs, no murmurs ABD:  Flat, positive bowel sounds normal in frequency in pitch, no bruits, no rebound, no guarding, no midline pulsatile mass, no hepatomegaly, no splenomegaly EXT:  2 plus pulses throughout, no edema, no cyanosis no clubbing   EKG:  EKG is not ordered today.    Recent Labs: 09/11/2018: ALT 20; BUN 13; Creatinine, Ser 1.10; Hemoglobin 14.4; Platelets 181; Potassium 4.1; Sodium 142    Lipid Panel    Component Value Date/Time   CHOL 102 09/11/2018 0811   TRIG 287 (H) 09/11/2018 0811   HDL 34 (L) 09/11/2018 0811   CHOLHDL 3.0 09/11/2018 0811   LDLCALC 11 09/11/2018 0811      Wt Readings from Last 3 Encounters:  01/23/19 192 lb (87.1 kg)  09/07/18 190 lb (86.2 kg)  05/19/18 190 lb (86.2 kg)      Other studies Reviewed: Additional studies/ records that were reviewed today include: None Review of the above records demonstrates:  NA   ASSESSMENT AND PLAN:  SOB:   He shortness of breath is baseline.  He does not think it is limiting.  He had a negative perfusion study.  I am not planning any further cardiovascular work-up at this point.   CAROTID STENOSIS:    He has is followed by [facility].   DYSLIPIDEMIA:    He is on Repatha.  He is done very well with this with his LDL of 11.  HTN:  The blood pressure is at target.  No change in therapy.   PVCs:   Lesion not bothering him.  No change in therapy.   LEG PAIN:    He has lumbar spondylosis.   I do not strongly suspect peripheral vascular disease and is not reporting this to be as symptomatic as previous.   Current medicines are reviewed at  length with the patient today.  The patient does not have concerns regarding medicines.  The following changes have been made:  None  Labs/ tests ordered today include: None  No orders of the defined types were placed in this encounter.  Disposition:   FU with me in one year.   Signed, Minus Breeding, MD  01/23/2019 12:40 PM    [place]

## 2019-01-23 ENCOUNTER — Ambulatory Visit (INDEPENDENT_AMBULATORY_CARE_PROVIDER_SITE_OTHER): Payer: Medicare Other | Admitting: Cardiology

## 2019-01-23 ENCOUNTER — Encounter: Payer: Self-pay | Admitting: Cardiology

## 2019-01-23 ENCOUNTER — Other Ambulatory Visit: Payer: Self-pay

## 2019-01-23 VITALS — BP 120/70 | HR 72 | Ht 70.0 in | Wt 192.0 lb

## 2019-01-23 DIAGNOSIS — E785 Hyperlipidemia, unspecified: Secondary | ICD-10-CM

## 2019-01-23 DIAGNOSIS — I493 Ventricular premature depolarization: Secondary | ICD-10-CM | POA: Diagnosis not present

## 2019-01-23 DIAGNOSIS — I10 Essential (primary) hypertension: Secondary | ICD-10-CM

## 2019-01-23 DIAGNOSIS — R0602 Shortness of breath: Secondary | ICD-10-CM

## 2019-01-23 NOTE — Patient Instructions (Signed)
Medication Instructions:  The current medical regimen is effective;  continue present plan and medications.  If you need a refill on your cardiac medications before your next appointment, please call your pharmacy.   Follow-Up: Follow up in 1 year with Dr. Hochrein in [place].  You will receive a letter in the mail 2 months before you are due.  Please call us when you receive this letter to schedule your follow up appointment.  Thank you for choosing [facility]!!     

## 2019-01-24 ENCOUNTER — Ambulatory Visit: Payer: Self-pay | Admitting: Family Medicine

## 2019-01-26 ENCOUNTER — Other Ambulatory Visit: Payer: Self-pay | Admitting: Family Medicine

## 2019-02-01 ENCOUNTER — Other Ambulatory Visit: Payer: Self-pay

## 2019-02-04 ENCOUNTER — Encounter: Payer: Self-pay | Admitting: Family Medicine

## 2019-02-04 ENCOUNTER — Ambulatory Visit (INDEPENDENT_AMBULATORY_CARE_PROVIDER_SITE_OTHER): Payer: Medicare Other | Admitting: Family Medicine

## 2019-02-04 ENCOUNTER — Other Ambulatory Visit: Payer: Self-pay

## 2019-02-04 DIAGNOSIS — F5104 Psychophysiologic insomnia: Secondary | ICD-10-CM

## 2019-02-04 MED ORDER — QUETIAPINE FUMARATE 100 MG PO TABS: 100.0000 mg | ORAL_TABLET | Freq: Every day | ORAL | 3 refills | Status: DC

## 2019-02-04 NOTE — Progress Notes (Signed)
BP 140/76   Pulse 64   Temp (!) 97.3 F (36.3 C) (Temporal)   Ht 5\' 10"  (1.778 m)   Wt 191 lb 3.2 oz (86.7 kg)   SpO2 95%   BMI 27.43 kg/m    Subjective:   Patient ID: [name], male    DOB: [birth year], 83 y.o.   MRN: [id number]  HPI: [name] is a 83 y.o. male presenting on 02/04/2019 for Insomnia (Patient states that it has been ongoing but worse since his wife died in 2022/12/11)   HPI Patient is coming in for insomnia recheck He is still initiating a taking 100 mg and feels like it is working very well for him is very satisfied comments also help a lot with anxiety and depression, things done, he still has less headaches since his wife passed away but has been doing a lot better.  Patient denies any suicidal ideations or thoughts of hurting himself.  Relevant past medical, surgical, family and social history reviewed and updated as indicated. Interim medical history since our last visit reviewed. Allergies and medications reviewed and updated.  Review of Systems  Constitutional: Negative for chills and fever.  Respiratory: Negative for shortness of breath and wheezing.   Cardiovascular: Negative for chest pain and leg swelling.  Skin: Negative for rash.  Psychiatric/Behavioral: Positive for dysphoric mood and sleep disturbance. Negative for self-injury and suicidal ideas. The patient is nervous/anxious.   All other systems reviewed and are negative.   Per HPI unless specifically indicated above   Allergies as of 02/04/2019      Reactions   Lamisil [terbinafine Hcl] Other (See Comments)   Loss of taste       Medication List       Accurate as of February 04, 2019  2:08 PM. If you have any questions, ask your nurse or doctor.        albuterol 108 (90 Base) MCG/ACT inhaler Commonly known as: VENTOLIN HFA Inhale 2 puffs into the lungs every 6 (six) hours as needed for wheezing or shortness of breath.   Anoro Ellipta 62.5-25 MCG/INH Aepb Generic drug:  umeclidinium-vilanterol USE 1 INHALATION DAILY   aspirin 81 MG tablet Take 81 mg by mouth 2 (two) times daily.   cetirizine 10 MG tablet Commonly known as: ZYRTEC TAKE 1 TABLET DAILY   Evolocumab 140 MG/ML Soaj Commonly known as: Repatha SureClick Inject [id number] mg into the skin every 14 (fourteen) days.   ferrous sulfate 325 (65 FE) MG tablet Commonly known as: FeroSul Take 1 tablet (325 mg total) by mouth daily.   furosemide 20 MG tablet Commonly known as: LASIX Take 1 tablet (20 mg total) by mouth daily.   mirabegron ER 50 MG Tb24 tablet Commonly known as: Myrbetriq Take 1 tablet (50 mg total) by mouth daily.   omeprazole 20 MG capsule Commonly known as: PRILOSEC TAKE 1 CAPSULE DAILY   QUEtiapine 100 MG tablet Commonly known as: SEROquel Take 1 tablet (100 mg total) by mouth at bedtime. What changed:   medication strength  how much to take Changed by: Fransisca Kaufmann Kailo Kosik, MD   ramipril 10 MG capsule Commonly known as: ALTACE Take 1 capsule (10 mg total) by mouth daily.   Vitamin D (Ergocalciferol) 1.25 MG (50000 UT) Caps capsule Commonly known as: DRISDOL Take 1 capsule (50,000 Units total) by mouth once a week.        Objective:   BP 140/76   Pulse 64   Temp (!) 97.3 F (  36.3 C) (Temporal)   Ht 5\' 10"  (1.778 m)   Wt 191 lb 3.2 oz (86.7 kg)   SpO2 95%   BMI 27.43 kg/m   Wt Readings from Last 3 Encounters:  02/04/19 191 lb 3.2 oz (86.7 kg)  01/23/19 192 lb (87.1 kg)  09/07/18 190 lb (86.2 kg)    Physical Exam Vitals signs and nursing note reviewed.  Constitutional:      General: He is not in acute distress.    Appearance: He is well-developed. He is not diaphoretic.  Eyes:     General: No scleral icterus.    Conjunctiva/sclera: Conjunctivae normal.  Neck:     Musculoskeletal: Neck supple.     Thyroid: No thyromegaly.  Cardiovascular:     Rate and Rhythm: Normal rate and regular rhythm.     Heart sounds: Normal heart sounds. No murmur.   Pulmonary:     Effort: Pulmonary effort is normal. No respiratory distress.     Breath sounds: Normal breath sounds. No wheezing.  Musculoskeletal: Normal range of motion.  Lymphadenopathy:     Cervical: No cervical adenopathy.  Skin:    General: Skin is warm and dry.     Findings: No rash.  Neurological:     Mental Status: He is alert and oriented to person, place, and time.     Coordination: Coordination normal.  Psychiatric:        Behavior: Behavior normal.       Assessment & Plan:   Problem List Items Addressed This Visit    None    Visit Diagnoses    Psychophysiological insomnia       Relevant Medications   QUEtiapine (SEROQUEL) 100 MG tablet      He is doing good on the 100 mg and we will keep him lisinopril prescription for it Follow up plan: Return in about 3 months (around 05/07/2019), or if symptoms worsen or fail to improve, for Hypertension and cholesterol and blood work.  Counseling provided for all of the vaccine components No orders of the defined types were placed in this encounter.   Caryl Pina, MD [facility] 02/04/2019, 2:08 PM

## 2019-02-19 ENCOUNTER — Other Ambulatory Visit: Payer: Self-pay | Admitting: Family Medicine

## 2019-02-19 DIAGNOSIS — M48062 Spinal stenosis, lumbar region with neurogenic claudication: Secondary | ICD-10-CM

## 2019-03-05 ENCOUNTER — Other Ambulatory Visit: Payer: Self-pay

## 2019-03-05 DIAGNOSIS — I6521 Occlusion and stenosis of right carotid artery: Secondary | ICD-10-CM

## 2019-03-06 ENCOUNTER — Inpatient Hospital Stay (HOSPITAL_COMMUNITY): Admission: RE | Admit: 2019-03-06 | Payer: Medicare Other | Source: Ambulatory Visit

## 2019-03-06 ENCOUNTER — Ambulatory Visit: Payer: Medicare Other | Admitting: Family

## 2019-03-28 ENCOUNTER — Telehealth (HOSPITAL_COMMUNITY): Payer: Self-pay

## 2019-03-28 NOTE — Telephone Encounter (Signed)

## 2019-03-29 ENCOUNTER — Encounter (HOSPITAL_COMMUNITY): Payer: Medicare Other

## 2019-03-29 ENCOUNTER — Other Ambulatory Visit: Payer: Self-pay

## 2019-03-29 ENCOUNTER — Encounter: Payer: Self-pay | Admitting: Family

## 2019-03-29 ENCOUNTER — Ambulatory Visit (INDEPENDENT_AMBULATORY_CARE_PROVIDER_SITE_OTHER): Payer: Medicare Other | Admitting: Family

## 2019-03-29 ENCOUNTER — Ambulatory Visit (HOSPITAL_COMMUNITY)
Admission: RE | Admit: 2019-03-29 | Discharge: 2019-03-29 | Disposition: A | Discharge status: Home / Self Care | Payer: Medicare Other | Source: Ambulatory Visit | Attending: Family | Admitting: Family

## 2019-03-29 VITALS — Ht 70.0 in | Wt 190.0 lb

## 2019-03-29 DIAGNOSIS — I6521 Occlusion and stenosis of right carotid artery: Secondary | ICD-10-CM | POA: Diagnosis not present

## 2019-03-29 DIAGNOSIS — Z9889 Other specified postprocedural states: Secondary | ICD-10-CM | POA: Diagnosis not present

## 2019-03-29 DIAGNOSIS — Z87891 Personal history of nicotine dependence: Secondary | ICD-10-CM

## 2019-03-29 DIAGNOSIS — I6523 Occlusion and stenosis of bilateral carotid arteries: Secondary | ICD-10-CM | POA: Diagnosis not present

## 2019-03-29 NOTE — Patient Instructions (Signed)

## 2019-03-29 NOTE — Progress Notes (Signed)
Virtual Visit via Telephone Note   I connected with [name] on 03/29/2019 using the [employer] by telephone and verified that I was speaking with the correct person using two identifiers. Patient was located at his home and accompanied by himself. I am located at the [facility] office/clinic.   The limitations of evaluation and management by telemedicine and the availability of in person appointments have been previously discussed with the patient and are documented in the patients chart. The patient expressed understanding and consented to proceed.  PCP: Dettinger, Fransisca Kaufmann, MD  Chief Complaint: follow up extracranial carotid artery stenosis   History of Present Illness: [name] is a 84 y.o. male who is status post right CEA in 2007 by Dr. Kellie Simmering. His carotid stenosis was discovered by his PCP when he heard a bruit.  He has no history of TIA or stroke symptoms.Specifically the patient denies a history of amaurosis fugax or monocular blindness, unilateral facial drooping, hemiplegia, or receptive or expressive aphasia.  He denies claudication type symptoms with walking, denies non-healing wounds. He works part time and stays physically active.  He saw a cardiologist for PVC's, stopped drinking caffeine and he has had almost no PVC's since then.  He states both hips, left worse than right, are somewhat painful when he walks; pt states this has not yet been evaluated. He denies any known back problems. He has COPD and if he does too much dyspnea will slow him down.   Diabetic: No Tobacco use: former smoker, quit in 1998, smoked for 40 years  Pt meds include: Statin :Repatha   ASA: Yes Other anticoagulants/antiplatelets: no   Past Medical History:  Diagnosis Date  . Adenomatous colon polyp 11/1999  . Allergy   . Blood transfusion without reported diagnosis   . Cancer [facility])    Prostate  . Carotid artery occlusion   . Cataract   . COPD (chronic obstructive  pulmonary disease) ([place])   . GERD (gastroesophageal reflux disease)   . Hyperlipidemia   . Hypertension   . Internal hemorrhoids   . PVC (premature ventricular contraction)     Past Surgical History:  Procedure Laterality Date  . APPENDECTOMY    . carotid surgery    . CATARACT EXTRACTION     both eyes  . COLONOSCOPY    . POLYPECTOMY    . TRANSURETHRAL RESECTION OF PROSTATE    . UPPER GASTROINTESTINAL ENDOSCOPY    . UPPER GI ENDOSCOPY  Nov. 3, 2014   Upper endoscopy with diatation    Current Meds  Medication Sig  . albuterol (PROVENTIL HFA;VENTOLIN HFA) 108 (90 Base) MCG/ACT inhaler Inhale 2 puffs into the lungs every 6 (six) hours as needed for wheezing or shortness of breath.  Jearl Klinefelter ELLIPTA 62.5-25 MCG/INH AEPB USE 1 INHALATION DAILY  . aspirin 81 MG tablet Take 81 mg by mouth 2 (two) times daily.   . cetirizine (ZYRTEC) 10 MG tablet TAKE 1 TABLET DAILY  . Evolocumab (REPATHA SURECLICK) XX123456 MG/ML SOAJ Inject 140 mg into the skin every 14 (fourteen) days.  . ferrous sulfate (FEROSUL) 325 (65 FE) MG tablet Take 1 tablet (325 mg total) by mouth daily.  . furosemide (LASIX) 20 MG tablet Take 1 tablet (20 mg total) by mouth daily.  . mirabegron ER (MYRBETRIQ) 50 MG TB24 tablet Take 1 tablet (50 mg total) by mouth daily.  Marland Kitchen omeprazole (PRILOSEC) 20 MG capsule TAKE 1 CAPSULE DAILY  . QUEtiapine (SEROQUEL) 100 MG tablet Take 1 tablet (  100 mg total) by mouth at bedtime.  . ramipril (ALTACE) 10 MG capsule TAKE 1 CAPSULE DAILY  . Vitamin D, Ergocalciferol, (DRISDOL) 1.25 MG (50000 UT) CAPS capsule Take 1 capsule (50,000 Units total) by mouth once a week.   Current Facility-Administered Medications for the 03/29/19 encounter (Office Visit) with Derwin Reddy, Sharmon Leyden, NP  Medication  . 0.9 %  sodium chloride infusion    12 system ROS was negative unless otherwise noted in HPI   Observations/Objective:  Carotid Duplex (03-29-19): Right Carotid: Velocities in the right ICA are consistent  with a 1-39% stenosis. Left Carotid: Velocities in the left ICA are consistent with a 1-39% stenosis. Vertebrals:  Right vertebral artery demonstrates antegrade flow. Left vertebral artery was not visualized. Subclavians: Normal flow hemodynamics were seen in bilateral subclavian arteries.   Assessment and Plan: [name] is a 84 y.o. male who isstatus post right CEA in 2007. He has no history of stroke or TIA.  Carotid duplex today shows minimal stenosis of the bilateral ICA.   Follow Up Instructions:   Follow up 18 months with carotid duplex.    I discussed the assessment and treatment plan with the patient. The patient was provided an opportunity to ask questions and all were answered. The patient agreed with the plan and demonstrated an understanding of the instructions.   The patient was advised to call back or seek an in-person evaluation if the symptoms worsen or if the condition fails to improve as anticipated.  I spent 9 minutes with the patient via telephone encounter.   Gabrielle Dare Norena Bratton [facility] Office: [phone]  03/29/2019, 5:22 PM

## 2019-04-01 DIAGNOSIS — X32XXXD Exposure to sunlight, subsequent encounter: Secondary | ICD-10-CM | POA: Diagnosis not present

## 2019-04-01 DIAGNOSIS — L570 Actinic keratosis: Secondary | ICD-10-CM | POA: Diagnosis not present

## 2019-04-01 DIAGNOSIS — Z23 Encounter for immunization: Secondary | ICD-10-CM | POA: Diagnosis not present

## 2019-04-01 DIAGNOSIS — L821 Other seborrheic keratosis: Secondary | ICD-10-CM | POA: Diagnosis not present

## 2019-04-02 ENCOUNTER — Other Ambulatory Visit: Payer: Self-pay | Admitting: *Deleted

## 2019-04-02 DIAGNOSIS — I6523 Occlusion and stenosis of bilateral carotid arteries: Secondary | ICD-10-CM

## 2019-04-05 ENCOUNTER — Other Ambulatory Visit: Payer: Self-pay | Admitting: Family Medicine

## 2019-04-05 DIAGNOSIS — N3281 Overactive bladder: Secondary | ICD-10-CM

## 2019-04-26 ENCOUNTER — Other Ambulatory Visit: Payer: Self-pay | Admitting: Family Medicine

## 2019-04-26 DIAGNOSIS — J439 Emphysema, unspecified: Secondary | ICD-10-CM

## 2019-04-26 NOTE — Telephone Encounter (Signed)
Left message to please call our office.  What is the name of the rescue inhaler?

## 2019-04-26 NOTE — Telephone Encounter (Signed)
What is the name of the medication? Rescue inhaler  Have you contacted your pharmacy to request a refill? yes  Which pharmacy would you like this sent to? [facility]   Patient notified that their request is being sent to the clinical staff for review and that they should receive a call once it is complete. If they do not receive a call within 24 hours they can check with their pharmacy or our office.

## 2019-04-29 DIAGNOSIS — Z23 Encounter for immunization: Secondary | ICD-10-CM | POA: Diagnosis not present

## 2019-04-29 MED ORDER — ALBUTEROL SULFATE HFA 108 (90 BASE) MCG/ACT IN AERS: 2.0000 | INHALATION_SPRAY | Freq: Four times a day (QID) | RESPIRATORY_TRACT | 3 refills | Status: DC | PRN

## 2019-04-29 NOTE — Telephone Encounter (Signed)
I sent a refill for the inhaler to [facility] for him.

## 2019-04-30 NOTE — Telephone Encounter (Signed)
Patient notified via voicemail.

## 2019-05-08 ENCOUNTER — Other Ambulatory Visit: Payer: Self-pay

## 2019-05-10 ENCOUNTER — Ambulatory Visit (INDEPENDENT_AMBULATORY_CARE_PROVIDER_SITE_OTHER): Payer: Medicare Other | Admitting: Family Medicine

## 2019-05-10 ENCOUNTER — Other Ambulatory Visit: Payer: Self-pay

## 2019-05-10 ENCOUNTER — Encounter: Payer: Self-pay | Admitting: Family Medicine

## 2019-05-10 DIAGNOSIS — I6523 Occlusion and stenosis of bilateral carotid arteries: Secondary | ICD-10-CM | POA: Diagnosis not present

## 2019-05-10 DIAGNOSIS — F5104 Psychophysiologic insomnia: Secondary | ICD-10-CM | POA: Diagnosis not present

## 2019-05-10 DIAGNOSIS — J439 Emphysema, unspecified: Secondary | ICD-10-CM

## 2019-05-10 MED ORDER — TRELEGY ELLIPTA 100-62.5-25 MCG/INH IN AEPB: 1.0000 | INHALATION_SPRAY | Freq: Every day | RESPIRATORY_TRACT | 3 refills | Status: DC

## 2019-05-10 MED ORDER — ALBUTEROL SULFATE HFA 108 (90 BASE) MCG/ACT IN AERS: 2.0000 | INHALATION_SPRAY | Freq: Four times a day (QID) | RESPIRATORY_TRACT | 3 refills | Status: DC | PRN

## 2019-05-10 NOTE — Progress Notes (Signed)
Virtual Visit via telephone Note  I connected with [name] on 05/10/19 at 0853 by telephone and verified that I am speaking with the correct person using two identifiers. [name] is currently located at home and no other people are currently with her during visit. The provider, Fransisca Kaufmann Joban Colledge, MD is located in their office at time of visit.  Call ended at 0912  I discussed the limitations, risks, security and privacy concerns of performing an evaluation and management service by telephone and the availability of in person appointments. I also discussed with the patient that there may be a patient responsible charge related to this service. The patient expressed understanding and agreed to proceed.   History and Present Illness: Patient is calling in for breathing issues and he is out of his rescue inhalers and is needing them more often. He has to stop more frequently when he is active and has been increased this winter. Denies fevers or chills or covid contacts.   Patient says Seroquel is working but he has to use everyday. He feels like it is working well.    No diagnosis found.  Outpatient Encounter Medications as of 05/10/2019  Medication Sig  . albuterol (VENTOLIN HFA) 108 (90 Base) MCG/ACT inhaler Inhale 2 puffs into the lungs every 6 (six) hours as needed for wheezing or shortness of breath.  Jearl Klinefelter ELLIPTA 62.5-25 MCG/INH AEPB USE 1 INHALATION DAILY  . aspirin 81 MG tablet Take 81 mg by mouth 2 (two) times daily.   . cetirizine (ZYRTEC) 10 MG tablet TAKE 1 TABLET DAILY  . Evolocumab (REPATHA SURECLICK) [id number] MG/ML SOAJ Inject 140 mg into the skin every 14 (fourteen) days.  . ferrous sulfate (FEROSUL) 325 (65 FE) MG tablet Take 1 tablet (325 mg total) by mouth daily.  . furosemide (LASIX) 20 MG tablet Take 1 tablet (20 mg total) by mouth daily.  Marland Kitchen MYRBETRIQ 50 MG TB24 tablet TAKE 1 TABLET DAILY  . omeprazole (PRILOSEC) 20 MG capsule TAKE 1 CAPSULE DAILY  .  QUEtiapine (SEROQUEL) 100 MG tablet Take 1 tablet (100 mg total) by mouth at bedtime.  . ramipril (ALTACE) 10 MG capsule TAKE 1 CAPSULE DAILY  . Vitamin D, Ergocalciferol, (DRISDOL) 1.25 MG (50000 UT) CAPS capsule Take 1 capsule (50,000 Units total) by mouth once a week.   Facility-Administered Encounter Medications as of 05/10/2019  Medication  . 0.9 %  sodium chloride infusion    Review of Systems  Constitutional: Negative for chills and fever.  Respiratory: Positive for cough and shortness of breath. Negative for chest tightness and wheezing.   Cardiovascular: Negative for chest pain and leg swelling.  Musculoskeletal: Negative for back pain and gait problem.  Skin: Negative for rash.  All other systems reviewed and are negative.   Observations/Objective: Patient sounds comfortable and in no acute distress  Assessment and Plan: Problem List Items Addressed This Visit      Respiratory   COPD (chronic obstructive pulmonary disease) ([place]) - Primary   Relevant Medications   albuterol (VENTOLIN HFA) 108 (90 Base) MCG/ACT inhaler   Fluticasone-Umeclidin-Vilant (TRELEGY ELLIPTA) 100-62.5-25 MCG/INH AEPB    Other Visit Diagnoses    Psychophysiological insomnia          Will change from Anoro to Trelegy because of symptoms and gave rescue inhaler Follow up plan: Return in about 3 months (around 08/07/2019), or if symptoms worsen or fail to improve, for copd and yearly.     I discussed the assessment and  treatment plan with the patient. The patient was provided an opportunity to ask questions and all were answered. The patient agreed with the plan and demonstrated an understanding of the instructions.   The patient was advised to call back or seek an in-person evaluation if the symptoms worsen or if the condition fails to improve as anticipated.  The above assessment and management plan was discussed with the patient. The patient verbalized understanding of and has agreed to the  management plan. Patient is aware to call the clinic if symptoms persist or worsen. Patient is aware when to return to the clinic for a follow-up visit. Patient educated on when it is appropriate to go to the emergency department.    I provided 19 minutes of non-face-to-face time during this encounter.    Worthy Rancher, MD

## 2019-05-16 ENCOUNTER — Other Ambulatory Visit: Payer: Self-pay | Admitting: *Deleted

## 2019-05-16 DIAGNOSIS — J439 Emphysema, unspecified: Secondary | ICD-10-CM

## 2019-05-16 DIAGNOSIS — F5104 Psychophysiologic insomnia: Secondary | ICD-10-CM

## 2019-05-16 MED ORDER — ALBUTEROL SULFATE HFA 108 (90 BASE) MCG/ACT IN AERS: 2.0000 | INHALATION_SPRAY | Freq: Four times a day (QID) | RESPIRATORY_TRACT | 3 refills | Status: DC | PRN

## 2019-05-16 MED ORDER — QUETIAPINE FUMARATE 100 MG PO TABS: 100.0000 mg | ORAL_TABLET | Freq: Every day | ORAL | 3 refills | Status: DC

## 2019-05-29 ENCOUNTER — Ambulatory Visit (INDEPENDENT_AMBULATORY_CARE_PROVIDER_SITE_OTHER): Payer: Medicare Other | Admitting: *Deleted

## 2019-05-29 DIAGNOSIS — Z0000 Encounter for general adult medical examination without abnormal findings: Secondary | ICD-10-CM | POA: Diagnosis not present

## 2019-05-29 NOTE — Progress Notes (Signed)
MEDICARE ANNUAL WELLNESS VISIT  05/29/2019  Telephone Visit Disclaimer This Medicare AWV was conducted by telephone due to national recommendations for restrictions regarding the COVID-19 Pandemic (e.g. social distancing).  I verified, using two identifiers, that I am speaking with [name] or their authorized healthcare agent. I discussed the limitations, risks, security, and privacy concerns of performing an evaluation and management service by telephone and the potential availability of an in-person appointment in the future. The patient expressed understanding and agreed to proceed.   Subjective:  [name] is a 84 y.o. male patient of Dettinger, Fransisca Kaufmann, MD who had a Medicare Annual Wellness Visit today via telephone. [name] is recently widowed and lives alone. He does some part time work for the [employer] He has 2 daughters and numerous grandchildren and great grandchildren. He reports that he is socially active and does interact with friends/family regularly. He is not physically active and enjoys makeing memories with his family. 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   Patient Care Team: Dettinger, Fransisca Kaufmann, MD as PCP - General (Family Medicine) Minus Breeding, MD as Consulting Physician (Cardiology)  Advanced Directives 05/29/2019 07/05/2017 04/26/2017 01/24/2017 02/20/2015 02/06/2014  Does Patient Have a Medical Advance Directive? Yes No Yes Yes No No  Type of Advance Directive Living will;Out of facility DNR (pink MOST or yellow form) - - Press photographer;Living will - -  Does patient want to make changes to medical advance directive? No - Patient declined - - No - Patient declined - -  Copy of [place] in Chart? - - - No - copy requested - -  Would patient like information on creating a medical advance directive? - - - - - Yes - Educational materials given    Hospital Utilization Over the Past 12 Months: # of hospitalizations or ER  visits: 0 # of surgeries: 0  Review of Systems    Patient reports that his overall health is unchanged compared to last year.  History obtained from the patient.  Patient Reported Readings (BP, Pulse, CBG, Weight, etc) none  Pain Assessment Pain : No/denies pain     Current Medications & Allergies (verified) Allergies as of 05/29/2019      Reactions   Lamisil [terbinafine Hcl] Other (See Comments)   Loss of taste       Medication List       Accurate as of May 29, 2019  9:51 AM. If you have any questions, ask your nurse or doctor.        albuterol 108 (90 Base) MCG/ACT inhaler Commonly known as: VENTOLIN HFA Inhale 2 puffs into the lungs every 6 (six) hours as needed for wheezing or shortness of breath.   aspirin 81 MG tablet Take 81 mg by mouth 2 (two) times daily.   cetirizine 10 MG tablet Commonly known as: ZYRTEC TAKE 1 TABLET DAILY   Evolocumab 140 MG/ML Soaj Commonly known as: Repatha SureClick Inject [id number] mg into the skin every 14 (fourteen) days.   ferrous sulfate 325 (65 FE) MG tablet Commonly known as: FeroSul Take 1 tablet (325 mg total) by mouth daily.   furosemide 20 MG tablet Commonly known as: LASIX Take 1 tablet (20 mg total) by mouth daily.   Myrbetriq 50 MG Tb24 tablet Generic drug: mirabegron ER TAKE 1 TABLET DAILY   omeprazole 20 MG capsule Commonly known as: PRILOSEC TAKE 1 CAPSULE DAILY   QUEtiapine 100 MG tablet Commonly known as: SEROQUEL  Take 1 tablet (100 mg total) by mouth at bedtime.   ramipril 10 MG capsule Commonly known as: ALTACE TAKE 1 CAPSULE DAILY   Trelegy Ellipta 100-62.5-25 MCG/INH Aepb Generic drug: Fluticasone-Umeclidin-Vilant Inhale 1 puff into the lungs daily.   Vitamin D (Ergocalciferol) 1.25 MG (50000 UNIT) Caps capsule Commonly known as: DRISDOL Take 1 capsule (50,000 Units total) by mouth once a week.       History (reviewed): Past Medical History:  Diagnosis Date  . Adenomatous colon polyp  11/1999  . Allergy   . Blood transfusion without reported diagnosis   . Cancer [facility])    Prostate  . Carotid artery occlusion   . Cataract   . COPD (chronic obstructive pulmonary disease) ([place])   . GERD (gastroesophageal reflux disease)   . Hyperlipidemia   . Hypertension   . Internal hemorrhoids   . PVC (premature ventricular contraction)    Past Surgical History:  Procedure Laterality Date  . APPENDECTOMY    . carotid surgery    . CATARACT EXTRACTION     both eyes  . COLONOSCOPY    . POLYPECTOMY    . TRANSURETHRAL RESECTION OF PROSTATE    . UPPER GASTROINTESTINAL ENDOSCOPY    . UPPER GI ENDOSCOPY  Nov. 3, 2014   Upper endoscopy with diatation   Family History  Problem Relation Age of Onset  . Colon cancer Mother   . Cancer Mother        colon  . Heart disease Father        Heart Disease before age 14  . Heart attack Father 11  . Arthritis Sister   . Esophageal cancer Neg Hx   . Rectal cancer Neg Hx   . Stomach cancer Neg Hx   . Colon polyps Neg Hx    Social History   Socioeconomic History  . Marital status: Widowed    Spouse name: Not on file  . Number of children: 2  . Years of education: some college  . Highest education level: 12th grade  Occupational History  . Not on file  Tobacco Use  . Smoking status: Former Smoker    Years: 40.00    Types: Cigarettes    Quit date: 01/19/1997    Years since quitting: 22.3  . Smokeless tobacco: Never Used  Substance and Sexual Activity  . Alcohol use: No  . Drug use: No  . Sexual activity: Not on file  Other Topics Concern  . Not on file  Social History Narrative   Widowed.   Social Determinants of Health   Financial Resource Strain:   . Difficulty of Paying Living Expenses: Not on file  Food Insecurity:   . Worried About Charity fundraiser in the Last Year: Not on file  . Ran Out of Food in the Last Year: Not on file  Transportation Needs:   . Lack of Transportation (Medical): Not on file  . Lack of  Transportation (Non-Medical): Not on file  Physical Activity:   . Days of Exercise per Week: Not on file  . Minutes of Exercise per Session: Not on file  Stress:   . Feeling of Stress : Not on file  Social Connections:   . Frequency of Communication with Friends and Family: Not on file  . Frequency of Social Gatherings with Friends and Family: Not on file  . Attends Religious Services: Not on file  . Active Member of Clubs or Organizations: Not on file  . Attends Archivist  Meetings: Not on file  . Marital Status: Not on file    Activities of Daily Living In your present state of health, do you have any difficulty performing the following activities: 05/29/2019  Hearing? Y  Comment little difficulty hearing  Vision? N  Difficulty concentrating or making decisions? Y  Comment little difficulty remembering  Walking or climbing stairs? N  Dressing or bathing? N  Doing errands, shopping? N  Preparing Food and eating ? N  Using the Toilet? N  In the past six months, have you accidently leaked urine? Y  Comment limited bladder control due to a prior surgery, wears depends  Do you have problems with loss of bowel control? N  Managing your Medications? N  Managing your Finances? N  Housekeeping or managing your Housekeeping? N  Comment recently widowed and learning the best way to do things  Some recent data might be hidden    Patient Education/ Literacy How often do you need to have someone help you when you read instructions, pamphlets, or other written materials from your doctor or pharmacy?: 1 - Never What is the last grade level you completed in school?: some college  Exercise Current Exercise Habits: The patient does not participate in regular exercise at present, Exercise limited by: orthopedic condition(s)(some hip pain)  Diet Patient reports consuming 1 meals a day and 3 snack(s) a day Patient reports that his primary diet is: Regular Patient reports that she  does have regular access to food.   Depression Screen PHQ 2/9 Scores 05/29/2019 02/04/2019 09/07/2018 04/02/2018 01/10/2018 09/27/2017 06/12/2017  PHQ - 2 Score 0 0 0 0 0 0 0     Fall Risk Fall Risk  05/29/2019 02/04/2019 09/07/2018 04/02/2018 01/10/2018  Falls in the past year? 0 0 0 0 No     Objective:  [name] seemed alert and oriented and he participated appropriately during our telephone visit.  Blood Pressure Weight BMI  BP Readings from Last 3 Encounters:  02/04/19 140/76  01/23/19 120/70  09/07/18 132/60   Wt Readings from Last 3 Encounters:  03/29/19 190 lb (86.2 kg)  02/04/19 191 lb 3.2 oz (86.7 kg)  01/23/19 192 lb (87.1 kg)   BMI Readings from Last 1 Encounters:  03/29/19 27.26 kg/m    *Unable to obtain current vital signs, weight, and BMI due to telephone visit type  Hearing/Vision  . Verlon did not seem to have difficulty with hearing/understanding during the telephone conversation . Reports that he has not had a formal eye exam by an eye care professional within the past year . Reports that he has not had a formal hearing evaluation within the past year *Unable to fully assess hearing and vision during telephone visit type  Cognitive Function: 6CIT Screen 05/29/2019  What Year? 0 points  What month? 0 points  What time? 0 points  Count back from 20 0 points  Months in reverse 0 points  Repeat phrase 0 points  Total Score 0   (Normal:0-7, Significant for Dysfunction: >8)  Normal Cognitive Function Screening: Yes   Immunization & Health Maintenance Record Immunization History  Administered Date(s) Administered  . Fluad Quad(high Dose 65+) 01/17/2019  . Influenza, High Dose Seasonal PF 01/10/2017, 01/10/2018  . Influenza,inj,Quad PF,6+ Mos 12/17/2015  . Moderna SARS-COVID-2 Vaccination 04/01/2019, 04/29/2019    Health Maintenance  Topic Date Due  . PNA vac Low Risk Adult (1 of 2 - PCV13) 01/13/2003  . TETANUS/TDAP  09/07/2019 (Originally  01/12/1955)  . INFLUENZA VACCINE  Completed       Assessment  This is a routine wellness examination for [employer].  Health Maintenance: Due or Overdue Health Maintenance Due  Topic Date Due  . PNA vac Low Risk Adult (1 of 2 - PCV13) 01/13/2003    [name] does not need a referral for Community Assistance: Care Management:   no Social Work:    no Prescription Assistance:  no Nutrition/Diabetes Education:  no   Plan:  Personalized Goals Goals Addressed            This Visit's Progress   . Patient Stated       Increase to 3 meals daily with snacks in between Increase fruits and vegetables Increase water intake Increase Protein      Personalized Health Maintenance & Screening Recommendations  Pneumonia vaccine  Lung Cancer Screening Recommended: no (Low Dose CT Chest recommended if Age 52-80 years, 30 pack-year currently smoking OR have quit w/in past 15 years) Hepatitis C Screening recommended: no HIV Screening recommended: no  Advanced Directives: Written information was not prepared per patient's request.  Referrals & Orders No orders of the defined types were placed in this encounter.   Follow-up Plan . Follow-up with Dettinger, Fransisca Kaufmann, MD as planned   I have personally reviewed and noted the following in the patient's chart:   . Medical and social history . Use of alcohol, tobacco or illicit drugs  . Current medications and supplements . Functional ability and status . Nutritional status . Physical activity . Advanced directives . List of other physicians . Hospitalizations, surgeries, and ER visits in previous 12 months . Vitals . Screenings to include cognitive, depression, and falls . Referrals and appointments  In addition, I have reviewed and discussed with [name] certain preventive protocols, quality metrics, and best practice recommendations. A written personalized care plan for preventive services as well as general  preventive health recommendations is available and can be mailed to the patient at his request.      Baldomero Lamy, LPN  [id number]

## 2019-06-17 ENCOUNTER — Telehealth: Payer: Self-pay | Admitting: Family Medicine

## 2019-06-17 NOTE — Chronic Care Management (AMB) (Signed)
  Chronic Care Management   Note  06/17/2019 Name: Hillman Attig MRN: [id number] DOB: 11/20/1935  Amron Guerrette is a 84 y.o. year old male who is a primary care patient of Dettinger, Fransisca Kaufmann, MD. I reached out to [name] by phone today in response to a referral sent by Mr. [employer] health plan.     Mr. Swopes was given information about Chronic Care Management services today including:  1. CCM service includes personalized support from designated clinical staff supervised by his physician, including individualized plan of care and coordination with other care providers 2. 24/7 contact phone numbers for assistance for urgent and routine care needs. 3. Service will only be billed when office clinical staff spend 20 minutes or more in a month to coordinate care. 4. Only one practitioner may furnish and bill the service in a calendar month. 5. The patient may stop CCM services at any time (effective at the end of the month) by phone call to the office staff. 6. The patient will be responsible for cost sharing (co-pay) of up to 20% of the service fee (after annual deductible is met).  Patient did not agree to enrollment in care management services and does not wish to consider at this time.  Follow up plan: The patient has been provided with contact information for the care management team and has been advised to call with any health related questions or concerns.   Noreene Larsson, [address], [place], [place] [address] Direct Dial: [phone] [email or web address] Website: [email or web address]

## 2019-08-09 ENCOUNTER — Other Ambulatory Visit: Payer: Self-pay | Admitting: Family Medicine

## 2019-08-09 DIAGNOSIS — R221 Localized swelling, mass and lump, neck: Secondary | ICD-10-CM | POA: Diagnosis not present

## 2019-08-09 DIAGNOSIS — J029 Acute pharyngitis, unspecified: Secondary | ICD-10-CM | POA: Diagnosis not present

## 2019-08-12 ENCOUNTER — Ambulatory Visit (INDEPENDENT_AMBULATORY_CARE_PROVIDER_SITE_OTHER): Payer: Medicare Other | Admitting: Family Medicine

## 2019-08-12 ENCOUNTER — Encounter: Payer: Self-pay | Admitting: Family Medicine

## 2019-08-12 DIAGNOSIS — R591 Generalized enlarged lymph nodes: Secondary | ICD-10-CM

## 2019-08-12 DIAGNOSIS — J029 Acute pharyngitis, unspecified: Secondary | ICD-10-CM

## 2019-08-12 DIAGNOSIS — I6523 Occlusion and stenosis of bilateral carotid arteries: Secondary | ICD-10-CM | POA: Diagnosis not present

## 2019-08-12 NOTE — Progress Notes (Signed)
Virtual Visit via telephone Note  I connected with [name] on 08/12/19 at [facility] by telephone and verified that I am speaking with the correct person using two identifiers. [name] is currently located at home and no other people are currently with her during visit. The provider, Fransisca Kaufmann Hilliard Borges, MD is located in their office at time of visit.  Call ended at [phone]  I discussed the limitations, risks, security and privacy concerns of performing an evaluation and management service by telephone and the availability of in person appointments. I also discussed with the patient that there may be a patient responsible charge related to this service. The patient expressed understanding and agreed to proceed.   History and Present Illness: 4 days ago he started with a sore throat and swollen lymph nodes and he went to urgent care 3 days ago. He was strep and covid negative.  He was given antibiotics and steroids. He is improving and swelling is going down and swallowing is improving. He was given amoxicillin, and prednisone.  He was given 7 days of amoxicillin.  He denies fevers or chills or body aches.   No diagnosis found.  Outpatient Encounter Medications as of 08/12/2019  Medication Sig  . albuterol (VENTOLIN HFA) 108 (90 Base) MCG/ACT inhaler Inhale 2 puffs into the lungs every 6 (six) hours as needed for wheezing or shortness of breath.  Marland Kitchen aspirin 81 MG tablet Take 81 mg by mouth 2 (two) times daily.   . cetirizine (ZYRTEC) 10 MG tablet TAKE 1 TABLET DAILY  . Evolocumab (REPATHA SURECLICK) [id number] MG/ML SOAJ Inject 140 mg into the skin every 14 (fourteen) days.  . ferrous sulfate (FEROSUL) 325 (65 FE) MG tablet Take 1 tablet (325 mg total) by mouth daily.  . Fluticasone-Umeclidin-Vilant (TRELEGY ELLIPTA) 100-62.5-25 MCG/INH AEPB Inhale 1 puff into the lungs daily.  . furosemide (LASIX) 20 MG tablet Take 1 tablet (20 mg total) by mouth daily.  Marland Kitchen MYRBETRIQ 50 MG TB24 tablet TAKE 1  TABLET DAILY  . omeprazole (PRILOSEC) 20 MG capsule TAKE 1 CAPSULE DAILY  . QUEtiapine (SEROQUEL) 100 MG tablet Take 1 tablet (100 mg total) by mouth at bedtime.  . ramipril (ALTACE) 10 MG capsule TAKE 1 CAPSULE DAILY  . Vitamin D, Ergocalciferol, (DRISDOL) 1.25 MG (50000 UT) CAPS capsule Take 1 capsule (50,000 Units total) by mouth once a week.   Facility-Administered Encounter Medications as of 08/12/2019  Medication  . 0.9 %  sodium chloride infusion    Review of Systems  Constitutional: Negative for chills and fever.  HENT: Positive for congestion, facial swelling, postnasal drip, rhinorrhea, sinus pressure, sneezing and sore throat. Negative for ear discharge, ear pain and voice change.   Eyes: Negative for visual disturbance.  Respiratory: Negative for cough, shortness of breath and wheezing.   Cardiovascular: Negative for chest pain and leg swelling.  Musculoskeletal: Negative for gait problem.  Skin: Negative for rash.  All other systems reviewed and are negative.   Observations/Objective: Patient sounds comfortable and in no acute distress  Assessment and Plan: Problem List Items Addressed This Visit    None    Visit Diagnoses    Pharyngitis, unspecified etiology    -  Primary   Lymphadenopathy          Continue amoxicillin and if not completely resolved then will extend amoxicillin Follow up plan: Return if symptoms worsen or fail to improve.     I discussed the assessment and treatment plan with the patient.  The patient was provided an opportunity to ask questions and all were answered. The patient agreed with the plan and demonstrated an understanding of the instructions.   The patient was advised to call back or seek an in-person evaluation if the symptoms worsen or if the condition fails to improve as anticipated.  The above assessment and management plan was discussed with the patient. The patient verbalized understanding of and has agreed to the management  plan. Patient is aware to call the clinic if symptoms persist or worsen. Patient is aware when to return to the clinic for a follow-up visit. Patient educated on when it is appropriate to go to the emergency department.    I provided 12 minutes of non-face-to-face time during this encounter.    Worthy Rancher, MD

## 2019-09-10 ENCOUNTER — Telehealth: Payer: Self-pay | Admitting: Family Medicine

## 2019-09-10 NOTE — Telephone Encounter (Signed)
  Prescription Request  09/10/2019  What is the name of the medication or equipment? Vitamin D  Have you contacted your pharmacy to request a refill? (if applicable) Yes  Which pharmacy would you like this sent to?  [facility]   Patient notified that their request is being sent to the clinical staff for review and that they should receive a response within 2 business days.   Dettinger's pt.  Please call pt.

## 2019-09-10 NOTE — Telephone Encounter (Signed)
Pt would like for Dr. Warrick Parisian to review when he returns. No labwork. Please advise

## 2019-09-16 MED ORDER — VITAMIN D (ERGOCALCIFEROL) 1.25 MG (50000 UNIT) PO CAPS: 50000.0000 [IU] | ORAL_CAPSULE | ORAL | 3 refills | Status: DC

## 2019-09-16 NOTE — Telephone Encounter (Signed)
Sent the refill for vitamin D

## 2019-09-16 NOTE — Addendum Note (Signed)
Addended by: Caryl Pina on: 09/16/2019 01:06 PM   Modules accepted: Orders

## 2019-09-16 NOTE — Telephone Encounter (Signed)
Patient aware, script is ready. 

## 2019-11-15 ENCOUNTER — Other Ambulatory Visit: Payer: Self-pay | Admitting: Family Medicine

## 2019-11-15 DIAGNOSIS — M48062 Spinal stenosis, lumbar region with neurogenic claudication: Secondary | ICD-10-CM

## 2019-11-20 ENCOUNTER — Other Ambulatory Visit: Payer: Self-pay | Admitting: Family Medicine

## 2019-11-20 NOTE — Telephone Encounter (Signed)
Appt made

## 2019-11-20 NOTE — Telephone Encounter (Signed)
Dettinger. NTBS mail order not sent

## 2019-12-05 ENCOUNTER — Ambulatory Visit (INDEPENDENT_AMBULATORY_CARE_PROVIDER_SITE_OTHER): Payer: Medicare Other | Admitting: Family Medicine

## 2019-12-05 ENCOUNTER — Encounter: Payer: Self-pay | Admitting: Family Medicine

## 2019-12-05 ENCOUNTER — Other Ambulatory Visit: Payer: Self-pay

## 2019-12-05 VITALS — BP 147/49 | HR 75 | Temp 98.6°F | Ht 70.0 in | Wt 195.0 lb

## 2019-12-05 DIAGNOSIS — Z8546 Personal history of malignant neoplasm of prostate: Secondary | ICD-10-CM

## 2019-12-05 DIAGNOSIS — E785 Hyperlipidemia, unspecified: Secondary | ICD-10-CM | POA: Diagnosis not present

## 2019-12-05 DIAGNOSIS — I10 Essential (primary) hypertension: Secondary | ICD-10-CM

## 2019-12-05 DIAGNOSIS — Z23 Encounter for immunization: Secondary | ICD-10-CM

## 2019-12-05 DIAGNOSIS — I6523 Occlusion and stenosis of bilateral carotid arteries: Secondary | ICD-10-CM

## 2019-12-05 NOTE — Progress Notes (Signed)
BP (!) 147/49   Pulse 75   Temp 98.6 F (37 C)   Ht _0  (1.778 m)   Wt 195 lb (88.5 kg)   SpO2 94%   BMI 27.98 kg/m    Subjective:   Patient ID: [name], male    DOB: May 27, 1935, 84 y.o.   MRN: [id number]  HPI: [name] is a 84 y.o. male presenting on 12/05/2019 for Medical Management of Chronic Issues   HPI Hypertension Patient is currently on ramipril 10 mg, and their blood pressure today is 147/49. Patient denies any lightheadedness or dizziness. Patient denies headaches, blurred vision, chest pains, shortness of breath, or weakness. Denies any side effects from medication and is content with current medication.   Hyperlipidemia and cerebrovascular disease and CAD Patient is coming in for recheck of his hyperlipidemia. The patient is currently taking Repatha. They deny any issues with myalgias or history of liver damage from it. They deny any focal numbness or weakness or chest pain.   COPD Patient is coming in for COPD recheck today.  He is currently on albuterol and Trelegy.  He has a mild chronic cough but denies any major coughing spells or wheezing spells.  He has 1nighttime symptoms per week and 1daytime symptoms per week currently.   Relevant past medical, surgical, family and social history reviewed and updated as indicated. Interim medical history since our last visit reviewed. Allergies and medications reviewed and updated.  Review of Systems  Constitutional: Negative for chills and fever.  Eyes: Negative for visual disturbance.  Respiratory: Negative for shortness of breath and wheezing.   Cardiovascular: Negative for chest pain and leg swelling.  Musculoskeletal: Negative for back pain and gait problem.  Skin: Negative for rash.  Neurological: Negative for dizziness, weakness and light-headedness.  All other systems reviewed and are negative.   Per HPI unless specifically indicated above   Allergies as of 12/05/2019      Reactions    Lamisil [terbinafine Hcl] Other (See Comments)   Loss of taste       Medication List       Accurate as of December 05, 2019 11:59 PM. If you have any questions, ask your nurse or doctor.        STOP taking these medications   ferrous sulfate 325 (65 FE) MG tablet Commonly known as: FeroSul Stopped by: Fransisca Kaufmann Cleaven Demario, MD   furosemide 20 MG tablet Commonly known as: LASIX Stopped by: Worthy Rancher, MD     TAKE these medications   albuterol 108 (90 Base) MCG/ACT inhaler Commonly known as: VENTOLIN HFA Inhale 2 puffs into the lungs every 6 (six) hours as needed for wheezing or shortness of breath.   aspirin 81 MG tablet Take 81 mg by mouth 2 (two) times daily.   cetirizine 10 MG tablet Commonly known as: ZYRTEC TAKE 1 TABLET DAILY   Evolocumab 140 MG/ML Soaj Commonly known as: Repatha SureClick Inject 861 mg into the skin every 14 (fourteen) days.   Myrbetriq 50 MG Tb24 tablet Generic drug: mirabegron ER TAKE 1 TABLET DAILY   omeprazole 20 MG capsule Commonly known as: PRILOSEC TAKE 1 CAPSULE DAILY   QUEtiapine 100 MG tablet Commonly known as: SEROQUEL Take 1 tablet (100 mg total) by mouth at bedtime.   ramipril 10 MG capsule Commonly known as: ALTACE Take 1 capsule (10 mg total) by mouth daily. Needs to be seen before next refill   Trelegy Ellipta 100-62.5-25 MCG/INH Aepb Generic drug: Fluticasone-Umeclidin-Vilant  Inhale 1 puff into the lungs daily.   Vitamin D (Ergocalciferol) 1.25 MG (50000 UNIT) Caps capsule Commonly known as: DRISDOL Take 1 capsule (50,000 Units total) by mouth once a week.        Objective:   BP (!) 147/49   Pulse 75   Temp 98.6 F (37 C)   Ht _0  (1.778 m)   Wt 195 lb (88.5 kg)   SpO2 94%   BMI 27.98 kg/m   Wt Readings from Last 3 Encounters:  12/05/19 195 lb (88.5 kg)  03/29/19 190 lb (86.2 kg)  02/04/19 191 lb 3.2 oz (86.7 kg)    Physical Exam Vitals and nursing note reviewed.  Constitutional:       General: He is not in acute distress.    Appearance: He is well-developed. He is not diaphoretic.  Eyes:     General: No scleral icterus.    Conjunctiva/sclera: Conjunctivae normal.  Neck:     Thyroid: No thyromegaly.  Cardiovascular:     Rate and Rhythm: Normal rate and regular rhythm.     Heart sounds: Normal heart sounds. No murmur heard.   Pulmonary:     Effort: Pulmonary effort is normal. No respiratory distress.     Breath sounds: Normal breath sounds. No wheezing.  Musculoskeletal:        General: Normal range of motion.     Cervical back: Neck supple.  Lymphadenopathy:     Cervical: No cervical adenopathy.  Skin:    General: Skin is warm and dry.     Findings: No rash.  Neurological:     Mental Status: He is alert and oriented to person, place, and time.     Coordination: Coordination normal.  Psychiatric:        Behavior: Behavior normal.       Assessment & Plan:   Problem List Items Addressed This Visit      Cardiovascular and Mediastinum   HTN (hypertension), benign   Relevant Orders   CMP14+EGFR (Completed)   Essential hypertension   Relevant Orders   CBC with Differential/Platelet (Completed)     Other   History of prostate cancer   Relevant Orders   PSA, total and free (Completed)   Hyperlipidemia LDL goal <100   Relevant Orders   Lipid panel (Completed)    Other Visit Diagnoses    Flu vaccine need    -  Primary   Relevant Orders   Flu Vaccine QUAD High Dose(Fluad) (Completed)   Need for Tdap vaccination       Relevant Orders   Tdap vaccine greater than or equal to 7yo IM (Completed)      Gave sample for breztri 2 inhalers recommended 1 puff twice a day. Follow up plan: Return in about 2 months (around 02/04/2020), or if symptoms worsen or fail to improve, for COPD recheck.  Counseling provided for all of the vaccine components Orders Placed This Encounter  Procedures  . Flu Vaccine QUAD High Dose(Fluad)  . Tdap vaccine greater than  or equal to 7yo IM  . CBC with Differential/Platelet  . CMP14+EGFR  . Lipid panel  . PSA, total and free    Caryl Pina, MD [facility] 12/11/2019, 9:25 PM

## 2019-12-06 LAB — LIPID PANEL
Chol/HDL Ratio: 3.2 ratio (ref 0.0–5.0)
Cholesterol, Total: 119 mg/dL (ref 100–199)
HDL: 37 mg/dL — ABNORMAL LOW (ref >39)
LDL Chol Calc (NIH): 60 mg/dL (ref 0–99)
Triglycerides: 123 mg/dL (ref 0–149)
VLDL Cholesterol Cal: 22 mg/dL (ref 5–40)

## 2019-12-06 LAB — PSA, TOTAL AND FREE
PSA, Free Pct: 0 %
PSA, Free: <0.01 ng/mL
Prostate Specific Ag, Serum: 0 ng/mL (ref 0.0–4.0)

## 2019-12-06 LAB — CMP14+EGFR
ALT: 23 IU/L (ref 0–44)
AST: 21 IU/L (ref 0–40)
Albumin/Globulin Ratio: 1.8 (ref 1.2–2.2)
Albumin: 4.2 g/dL (ref 3.6–4.6)
Alkaline Phosphatase: 88 IU/L (ref 44–121)
BUN/Creatinine Ratio: 8 — ABNORMAL LOW (ref 10–24)
BUN: 8 mg/dL (ref 8–27)
Bilirubin Total: 0.3 mg/dL (ref 0.0–1.2)
CO2: 22 mmol/L (ref 20–29)
Calcium: 9.2 mg/dL (ref 8.6–10.2)
Chloride: 103 mmol/L (ref 96–106)
Creatinine, Ser: 1.04 mg/dL (ref 0.76–1.27)
GFR calc Af Amer: 77 mL/min/{1.73_m2} (ref >59)
GFR calc non Af Amer: 67 mL/min/{1.73_m2} (ref >59)
Globulin, Total: 2.3 g/dL (ref 1.5–4.5)
Glucose: 116 mg/dL — ABNORMAL HIGH (ref 65–99)
Potassium: 4.4 mmol/L (ref 3.5–5.2)
Sodium: 140 mmol/L (ref 134–144)
Total Protein: 6.5 g/dL (ref 6.0–8.5)

## 2019-12-06 LAB — CBC WITH DIFFERENTIAL/PLATELET
Basophils Absolute: 0.1 10*3/uL (ref 0.0–0.2)
Basos: 1 %
EOS (ABSOLUTE): 0.3 10*3/uL (ref 0.0–0.4)
Eos: 4 %
Hematocrit: 34.3 % — ABNORMAL LOW (ref 37.5–51.0)
Hemoglobin: 10.8 g/dL — ABNORMAL LOW (ref 13.0–17.7)
Immature Grans (Abs): 0.1 10*3/uL (ref 0.0–0.1)
Immature Granulocytes: 1 %
Lymphocytes Absolute: 1.9 10*3/uL (ref 0.7–3.1)
Lymphs: 29 %
MCH: 25.9 pg — ABNORMAL LOW (ref 26.6–33.0)
MCHC: 31.5 g/dL (ref 31.5–35.7)
MCV: 82 fL (ref 79–97)
Monocytes Absolute: 0.5 10*3/uL (ref 0.1–0.9)
Monocytes: 8 %
Neutrophils Absolute: 4 10*3/uL (ref 1.4–7.0)
Neutrophils: 57 %
Platelets: 216 10*3/uL (ref 150–450)
RBC: 4.17 x10E6/uL (ref 4.14–5.80)
RDW: 14.3 % (ref 11.6–15.4)
WBC: 6.8 10*3/uL (ref 3.4–10.8)

## 2019-12-09 ENCOUNTER — Other Ambulatory Visit: Payer: Self-pay | Admitting: *Deleted

## 2019-12-09 DIAGNOSIS — D649 Anemia, unspecified: Secondary | ICD-10-CM

## 2020-01-04 ENCOUNTER — Other Ambulatory Visit: Payer: Self-pay | Admitting: Family Medicine

## 2020-01-04 ENCOUNTER — Other Ambulatory Visit: Payer: Self-pay | Admitting: Cardiology

## 2020-01-04 DIAGNOSIS — M48062 Spinal stenosis, lumbar region with neurogenic claudication: Secondary | ICD-10-CM

## 2020-02-10 ENCOUNTER — Other Ambulatory Visit: Payer: Self-pay | Admitting: Family Medicine

## 2020-02-20 ENCOUNTER — Other Ambulatory Visit: Payer: Self-pay | Admitting: Family Medicine

## 2020-03-05 ENCOUNTER — Ambulatory Visit (INDEPENDENT_AMBULATORY_CARE_PROVIDER_SITE_OTHER): Payer: Medicare Other | Admitting: Family Medicine

## 2020-03-05 ENCOUNTER — Other Ambulatory Visit: Payer: Self-pay

## 2020-03-05 ENCOUNTER — Encounter: Payer: Self-pay | Admitting: Family Medicine

## 2020-03-05 VITALS — BP 146/82 | HR 69 | Temp 97.3°F | Ht 70.0 in | Wt 193.1 lb

## 2020-03-05 DIAGNOSIS — D649 Anemia, unspecified: Secondary | ICD-10-CM | POA: Diagnosis not present

## 2020-03-05 DIAGNOSIS — I10 Essential (primary) hypertension: Secondary | ICD-10-CM

## 2020-03-05 DIAGNOSIS — F5104 Psychophysiologic insomnia: Secondary | ICD-10-CM | POA: Diagnosis not present

## 2020-03-05 DIAGNOSIS — J439 Emphysema, unspecified: Secondary | ICD-10-CM | POA: Diagnosis not present

## 2020-03-05 DIAGNOSIS — R6889 Other general symptoms and signs: Secondary | ICD-10-CM | POA: Diagnosis not present

## 2020-03-05 DIAGNOSIS — I6523 Occlusion and stenosis of bilateral carotid arteries: Secondary | ICD-10-CM | POA: Diagnosis not present

## 2020-03-05 DIAGNOSIS — E785 Hyperlipidemia, unspecified: Secondary | ICD-10-CM

## 2020-03-05 DIAGNOSIS — Z23 Encounter for immunization: Secondary | ICD-10-CM

## 2020-03-05 MED ORDER — ALBUTEROL SULFATE HFA 108 (90 BASE) MCG/ACT IN AERS
2.0000 | INHALATION_SPRAY | Freq: Four times a day (QID) | RESPIRATORY_TRACT | 3 refills | Status: DC | PRN
Start: 2020-03-05 — End: 2020-05-01

## 2020-03-05 MED ORDER — QUETIAPINE FUMARATE 50 MG PO TABS: 50.0000 mg | ORAL_TABLET | Freq: Every day | ORAL | 3 refills | Status: DC

## 2020-03-05 NOTE — Progress Notes (Signed)
BP (!) 146/82   Pulse 69   Temp (!) 97.3 F (36.3 C)   Ht '5\' 10"'  (1.778 m)   Wt 193 lb 2 oz (87.6 kg)   SpO2 97%   BMI 27.71 kg/m    Subjective:   Patient ID: [name], male    DOB: 11/17/1935, 84 y.o.   MRN: [id number]  HPI: [name] is a 84 y.o. male presenting on 03/05/2020 for Medical Management of Chronic Issues and Hyperlipidemia   HPI COPD/emphysema Patient is coming in for COPD recheck today.  He is currently on albuterol, has tried and failed Symbicort and Trelegy and a sample for breztri, did not feel like they helped and feels like his breathing is worsening.Marland Kitchen    Hypertension Patient is currently on ramipril, and their blood pressure today is 146/82. Patient denies any lightheadedness or dizziness. Patient denies headaches, blurred vision, chest pains, shortness of breath, or weakness. Denies any side effects from medication and is content with current medication.   Hyperlipidemia Patient is coming in for recheck of his hyperlipidemia. The patient is currently taking Repatha. They deny any issues with myalgias or history of liver damage from it. They deny any focal numbness or weakness or chest pain.   Patient is coming in for recheck for anemia and fatigue, last time he was down almost 4 points in his hemoglobin from 14-10 and he is feeling more fatigued and decreased energy and increased breathing issues since this time.  We are going to do a full anemia panel today.  He denies any bruising or bleeding or blood in his stool or dark tarry stools.  Relevant past medical, surgical, family and social history reviewed and updated as indicated. Interim medical history since our last visit reviewed. Allergies and medications reviewed and updated.  Review of Systems  Constitutional: Positive for fatigue. Negative for chills and fever.  Eyes: Negative for visual disturbance.  Respiratory: Positive for cough, shortness of breath and wheezing.   Cardiovascular:  Negative for chest pain and leg swelling.  Gastrointestinal: Negative for anal bleeding and blood in stool.  Genitourinary: Negative for hematuria.  Musculoskeletal: Negative for back pain and gait problem.  Skin: Negative for rash.  Neurological: Negative for dizziness, weakness and light-headedness.  All other systems reviewed and are negative.   Per HPI unless specifically indicated above   Allergies as of 03/05/2020      Reactions   Lamisil [terbinafine Hcl] Other (See Comments)   Loss of taste       Medication List       Accurate as of March 05, 2020  9:03 AM. If you have any questions, ask your nurse or doctor.        albuterol 108 (90 Base) MCG/ACT inhaler Commonly known as: VENTOLIN HFA Inhale 2 puffs into the lungs every 6 (six) hours as needed for wheezing or shortness of breath.   aspirin 81 MG tablet Take 81 mg by mouth 2 (two) times daily.   cetirizine 10 MG tablet Commonly known as: ZYRTEC TAKE 1 TABLET DAILY   Myrbetriq 50 MG Tb24 tablet Generic drug: mirabegron ER TAKE 1 TABLET DAILY   omeprazole 20 MG capsule Commonly known as: PRILOSEC TAKE 1 CAPSULE DAILY   QUEtiapine 50 MG tablet Commonly known as: SEROQUEL Take 1 tablet (50 mg total) by mouth at bedtime. What changed:   medication strength  how much to take Changed by: Fransisca Kaufmann Add Dinapoli, MD   ramipril 10 MG capsule Commonly known  as: ALTACE Take 1 capsule (10 mg total) by mouth daily.   Repatha SureClick 546 MG/ML Soaj Generic drug: Evolocumab INJECT 140 MG INTO THE SKIN EVERY 14 DAYS   Trelegy Ellipta 100-62.5-25 MCG/INH Aepb Generic drug: Fluticasone-Umeclidin-Vilant Inhale 1 puff into the lungs daily.   Vitamin D (Ergocalciferol) 1.25 MG (50000 UNIT) Caps capsule Commonly known as: DRISDOL Take 1 capsule (50,000 Units total) by mouth once a week.        Objective:   BP (!) 146/82   Pulse 69   Temp (!) 97.3 F (36.3 C)   Ht '5\' 10"'  (1.778 m)   Wt 193 lb 2 oz  (87.6 kg)   SpO2 97%   BMI 27.71 kg/m   Wt Readings from Last 3 Encounters:  03/05/20 193 lb 2 oz (87.6 kg)  12/05/19 195 lb (88.5 kg)  03/29/19 190 lb (86.2 kg)    Physical Exam Vitals and nursing note reviewed.  Constitutional:      General: He is not in acute distress.    Appearance: He is well-developed and well-nourished. He is not diaphoretic.  Eyes:     General: No scleral icterus.    Extraocular Movements: EOM normal.     Conjunctiva/sclera: Conjunctivae normal.  Neck:     Thyroid: No thyromegaly.  Cardiovascular:     Rate and Rhythm: Normal rate and regular rhythm.     Pulses: Intact distal pulses.     Heart sounds: Normal heart sounds. No murmur heard.   Pulmonary:     Effort: Pulmonary effort is normal. No respiratory distress.     Breath sounds: Normal breath sounds. No wheezing or rales.  Musculoskeletal:        General: No edema. Normal range of motion.     Cervical back: Neck supple.  Lymphadenopathy:     Cervical: No cervical adenopathy.  Skin:    General: Skin is warm and dry.     Findings: No rash.  Neurological:     Mental Status: He is alert and oriented to person, place, and time.     Coordination: Coordination normal.  Psychiatric:        Mood and Affect: Mood and affect normal.        Behavior: Behavior normal.       Assessment & Plan:   Problem List Items Addressed This Visit      Cardiovascular and Mediastinum   HTN (hypertension), benign - Primary   Relevant Orders   CMP14+EGFR   Essential hypertension     Respiratory   COPD (chronic obstructive pulmonary disease) (HCC)   Relevant Medications   albuterol (VENTOLIN HFA) 108 (90 Base) MCG/ACT inhaler   Other Relevant Orders   Ambulatory referral to Pulmonology     Other   Hyperlipidemia LDL goal <100    Other Visit Diagnoses    Need for vaccination against Streptococcus pneumoniae using pneumococcal conjugate vaccine 13       Relevant Orders   Pneumococcal conjugate  vaccine 13-valent (Completed)   Anemia, unspecified type       Relevant Orders   Anemia Profile B   Psychophysiological insomnia       Relevant Medications   QUEtiapine (SEROQUEL) 50 MG tablet      Will refer to pulmonology because of breathing. Refill albuterol. We will do full anemia panel because of anemia and fatigue.  Patient said he did better on Seroquel 50 mg instead of the 100 and will send a prescription for that. Follow up  plan: Return in about 3 months (around 06/03/2020), or if symptoms worsen or fail to improve, for Recheck COPD and breathing and anemia.  Counseling provided for all of the vaccine components Orders Placed This Encounter  Procedures  . Pneumococcal conjugate vaccine 13-valent  . CMP14+EGFR  . Anemia Profile B  . Ambulatory referral to [place], MD [facility] 03/05/2020, 9:03 AM

## 2020-03-06 LAB — ANEMIA PROFILE B
Basophils Absolute: 0.1 10*3/uL (ref 0.0–0.2)
Basos: 2 %
EOS (ABSOLUTE): 0.4 10*3/uL (ref 0.0–0.4)
Eos: 5 %
Ferritin: 16 ng/mL — ABNORMAL LOW (ref 30–400)
Folate: 5.5 ng/mL (ref >3.0)
Hematocrit: 34 % — ABNORMAL LOW (ref 37.5–51.0)
Hemoglobin: 10.2 g/dL — ABNORMAL LOW (ref 13.0–17.7)
Immature Grans (Abs): 0.1 10*3/uL (ref 0.0–0.1)
Immature Granulocytes: 1 %
Iron Saturation: 8 % — CL (ref 15–55)
Iron: 34 ug/dL — ABNORMAL LOW (ref 38–169)
Lymphocytes Absolute: 2.1 10*3/uL (ref 0.7–3.1)
Lymphs: 28 %
MCH: 23.9 pg — ABNORMAL LOW (ref 26.6–33.0)
MCHC: 30 g/dL — ABNORMAL LOW (ref 31.5–35.7)
MCV: 80 fL (ref 79–97)
Monocytes Absolute: 0.6 10*3/uL (ref 0.1–0.9)
Monocytes: 8 %
Neutrophils Absolute: 4.3 10*3/uL (ref 1.4–7.0)
Neutrophils: 56 %
Platelets: 250 10*3/uL (ref 150–450)
RBC: 4.26 x10E6/uL (ref 4.14–5.80)
RDW: 15.4 % (ref 11.6–15.4)
Retic Ct Pct: 1.9 % (ref 0.6–2.6)
Total Iron Binding Capacity: 446 ug/dL (ref 250–450)
UIBC: 412 ug/dL — ABNORMAL HIGH (ref 111–343)
Vitamin B-12: 362 pg/mL (ref 232–1245)
WBC: 7.5 10*3/uL (ref 3.4–10.8)

## 2020-03-06 LAB — CMP14+EGFR
ALT: 17 IU/L (ref 0–44)
AST: 27 IU/L (ref 0–40)
Albumin/Globulin Ratio: 1.6 (ref 1.2–2.2)
Albumin: 4.2 g/dL (ref 3.6–4.6)
Alkaline Phosphatase: 92 IU/L (ref 44–121)
BUN/Creatinine Ratio: 9 — ABNORMAL LOW (ref 10–24)
BUN: 12 mg/dL (ref 8–27)
Bilirubin Total: 0.3 mg/dL (ref 0.0–1.2)
CO2: 22 mmol/L (ref 20–29)
Calcium: 9.4 mg/dL (ref 8.6–10.2)
Chloride: 102 mmol/L (ref 96–106)
Creatinine, Ser: 1.32 mg/dL — ABNORMAL HIGH (ref 0.76–1.27)
GFR calc Af Amer: 58 mL/min/{1.73_m2} — ABNORMAL LOW (ref >59)
GFR calc non Af Amer: 50 mL/min/{1.73_m2} — ABNORMAL LOW (ref >59)
Globulin, Total: 2.7 g/dL (ref 1.5–4.5)
Glucose: 122 mg/dL — ABNORMAL HIGH (ref 65–99)
Potassium: 4.3 mmol/L (ref 3.5–5.2)
Sodium: 139 mmol/L (ref 134–144)
Total Protein: 6.9 g/dL (ref 6.0–8.5)

## 2020-03-12 ENCOUNTER — Telehealth: Payer: Self-pay

## 2020-03-12 NOTE — Telephone Encounter (Signed)
Refer to lab results.  

## 2020-03-30 DIAGNOSIS — X32XXXD Exposure to sunlight, subsequent encounter: Secondary | ICD-10-CM | POA: Diagnosis not present

## 2020-03-30 DIAGNOSIS — L570 Actinic keratosis: Secondary | ICD-10-CM | POA: Diagnosis not present

## 2020-04-22 ENCOUNTER — Other Ambulatory Visit: Payer: Self-pay

## 2020-04-22 ENCOUNTER — Ambulatory Visit (INDEPENDENT_AMBULATORY_CARE_PROVIDER_SITE_OTHER): Payer: Medicare Other

## 2020-04-22 ENCOUNTER — Encounter: Payer: Self-pay | Admitting: Internal Medicine

## 2020-04-22 ENCOUNTER — Ambulatory Visit (INDEPENDENT_AMBULATORY_CARE_PROVIDER_SITE_OTHER): Payer: Medicare Other | Admitting: Internal Medicine

## 2020-04-22 DIAGNOSIS — R0600 Dyspnea, unspecified: Secondary | ICD-10-CM

## 2020-04-22 DIAGNOSIS — R058 Other specified cough: Secondary | ICD-10-CM | POA: Diagnosis not present

## 2020-04-22 DIAGNOSIS — I10 Essential (primary) hypertension: Secondary | ICD-10-CM

## 2020-04-22 DIAGNOSIS — R0609 Other forms of dyspnea: Secondary | ICD-10-CM

## 2020-04-22 DIAGNOSIS — R0602 Shortness of breath: Secondary | ICD-10-CM | POA: Diagnosis not present

## 2020-04-22 MED ORDER — OLMESARTAN MEDOXOMIL 20 MG PO TABS: 20.0000 mg | ORAL_TABLET | Freq: Every day | ORAL | 11 refills | Status: DC

## 2020-04-22 NOTE — Assessment & Plan Note (Signed)
Onset around 2017 with neg cards w/u by Hochrein  -   04/22/2020   Walked RA  2 laps @ approx 274ft each @ moderate pace  stopped due to end of study, min sob with sats 96%    When respiratory symptoms begin or become refractory or unexplained well after a patient reports complete smoking cessation,  Especially when this wasn't the case while they were smoking, a red flag is raised based on the work of Dr Kris Mouton which states:  if you quit smoking when your best day FEV1 is still well preserved it is highly unlikely you will progress to severe disease.  That is to say, once the smoking stops,  the symptoms should not suddenly erupt or markedly worsen.  If so, the differential diagnosis should include  obesity/deconditioning,  LPR/Reflux/Aspiration syndromes,  occult CHF, or  especially side effect of medications commonly used in this population, esp acei.    rec rx gerd/ d/c acei and re-eval with pfts in 6 weeks if not improving but I stronlgy doubt he has significant copd

## 2020-04-22 NOTE — Patient Instructions (Addendum)
Stop ramapril   Start on ibesartan 20 mg daily and your symptoms should gradually resolve  Stop trelegy   Only use your albuterol as a rescue medication to be used if you can't catch your breath by resting or doing a relaxed purse lip breathing pattern.  - The less you use it, the better it will work when you need it. - Ok to use up to 2 puffs  every 4 hours if you must but call for immediate appointment if use goes up over your usual need - Don't leave home without it !!  (think of it like the spare tire for your car)   Change prilosec Take 30-60 min before first meal of the day   Please remember to go to the  x-ray department  for your tests - we will call you with the results when they are available    Please schedule a follow up office visit in 6 weeks, call sooner if needed

## 2020-04-22 NOTE — Assessment & Plan Note (Signed)
D/c acei 04/22/2020 due to cough and unexplained sob   In the best review of chronic cough to date ( NEJM 2016 375 [id number]) ,  ACEi are now felt to cause cough in up to  20% of pts which is a 4 fold increase from previous reports and does not include the variety of non-specific complaints we see in pulmonary clinic in pts on ACEi but previously attributed to another dx like  Copd/asthma and  include PNDS, throat and chest congestion, "bronchitis", unexplained dyspnea and noct "strangling" sensations, and hoarseness, but also  atypical /refractory GERD symptoms like dysphagia and "bad heartburn"   The only way I know  to prove this is not an "ACEi Case" is a trial off ACEi x a minimum of 6 weeks then regroup if still not better to his satisfaction           Each maintenance medication was reviewed in detail including emphasizing most importantly the difference between maintenance and prns and under what circumstances the prns are to be triggered using an action plan format where appropriate.  Total time for H and P, chart review, counseling,  directly observing portions of ambulatory 02 saturation study/ and generating customized AVS unique to this office visit / same day charting =  45 min

## 2020-04-22 NOTE — Progress Notes (Signed)
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 Pulmonary Consult    Referred by Dr Warrick Parisian. Pt c/o SOB x 5 years, worse x 1 1/5 years. He states he gets winded walking to the mailbox. He is using his ventolin inhaler 4 x per wk on average.    Dyspnea:  Daily 250 ft uphill to mailbox can make it s stopping at slower pace Cough: dry, daytime Sleep: fine on side/ 2 pillows / flat bad SABA use: not really helping, never prechallenges or rechallenges    No obvious day to day or daytime variability or assoc excess/ purulent sputum or mucus plugs or hemoptysis or cp or chest tightness, subjective wheeze or overt sinus or hb symptoms.   Sleeping as above without nocturnal  or early am exacerbation  of respiratory  c/o's or need for noct saba. Also denies any obvious fluctuation of symptoms with weather or environmental changes or other aggravating or alleviating factors except as outlined above   No unusual exposure hx or h/o childhood pna/ asthma or knowledge of premature birth.  Current Allergies, Complete Past Medical History, Past Surgical History, Family History, and Social History were reviewed in [employer] record.  ROS  The following are not active complaints unless bolded Hoarseness, sore throat, dysphagia, dental problems, itching, sneezing,  nasal congestion or discharge of excess mucus or purulent secretions, ear ache,   fever, chills, sweats, unintended wt loss or wt gain, classically pleuritic or exertional cp,   orthopnea pnd or arm/hand swelling  or leg swelling, presyncope, palpitations, abdominal pain, anorexia, nausea, vomiting, diarrhea  or change in bowel habits or change in bladder habits, change in stools or change in urine, dysuria, hematuria,  rash, arthralgias, visual complaints, headache, numbness, weakness or ataxia or problems with walking or coordination,  change in mood or  memory.                 Past Medical History:  Diagnosis Date  . Adenomatous colon polyp 11/1999  . Allergy   . Blood transfusion without reported diagnosis   . Cancer [facility])    Prostate  . Carotid artery occlusion   . Cataract   . COPD (chronic obstructive pulmonary disease) ([place])   . GERD (gastroesophageal reflux disease)   . Hyperlipidemia   . Hypertension   . Internal hemorrhoids   . PVC (premature ventricular contraction)     Outpatient Medications Prior to Visit  Medication Sig Dispense Refill  . albuterol (VENTOLIN HFA) 108 (90 Base) MCG/ACT inhaler Inhale 2 puffs into the lungs every 6 (six) hours as needed for wheezing or shortness of breath. 18 g 3  . aspirin 81 MG tablet Take 81 mg by mouth 2 (two) times daily.     . cetirizine (ZYRTEC) 10 MG tablet TAKE 1 TABLET DAILY 90 tablet 1  . Fluticasone-Umeclidin-Vilant (TRELEGY ELLIPTA) 100-62.5-25 MCG/INH AEPB Inhale 1 puff into the lungs daily. 90 each 3  . furosemide (LASIX) 20 MG tablet  Take 20 mg by mouth daily.    Marland Kitchen MYRBETRIQ 50 MG TB24 tablet TAKE 1 TABLET DAILY 90 tablet 3  . omeprazole (PRILOSEC) 20 MG capsule TAKE 1 CAPSULE DAILY 90 capsule 0  . QUEtiapine (SEROQUEL) 50 MG tablet Take 1 tablet (50 mg total) by mouth at bedtime. 90 tablet 3  . ramipril (ALTACE) 10 MG capsule Take 1 capsule (10 mg total) by mouth daily. 90 capsule 1  . REPATHA SURECLICK 140 MG/ML SOAJ INJECT 140 MG INTO THE SKIN EVERY 14 DAYS 6 mL 3  . Vitamin D, Ergocalciferol, (DRISDOL) 1.25 MG (50000 UNIT) CAPS capsule Take 1 capsule (50,000 Units total) by mouth once a  week. 12 capsule 3   No facility-administered medications prior to visit.     Objective:     BP 140/84 (BP Location: Left Arm, Cuff Size: Normal)   Pulse 69   Temp 98.2 F (36.8 C) (Temporal)   Ht 5\' 10"  (1.778 m)   Wt 189 lb (85.7 kg)   SpO2 97% Comment: on RA  BMI 27.12 kg/m   SpO2: 97 % (on RA)   Pleasant minimally hoarse wm nad    HEENT : pt wearing mask not removed for exam due to covid -19 concerns.    NECK :  without JVD/Nodes/TM/ nl carotid upstrokes bilaterally   LUNGS: no acc muscle use,  Nl contour chest which is clear to A and P bilaterally without cough on insp or exp maneuvers   CV:  RRR  no s3 or murmur or increase in P2, and no edema   ABD:  soft and nontender with nl inspiratory excursion in the supine position. No bruits or organomegaly appreciated, bowel sounds nl  MS:  Nl gait/ ext warm without deformities, calf tenderness, cyanosis or clubbing No obvious joint restrictions   SKIN: warm and dry without lesions    NEURO:  alert, approp, nl sensorium with  no motor or cerebellar deficits apparent.     CXR PA and Lateral:   04/22/2020 :    I personally reviewed images and agree with  impression as follows:   No acute abnormalities        Assessment   Upper airway cough syndrome Onset around 2017 assob with sob/ never noct - d/c acei 04/22/2020 as well as dpi  Upper airway cough syndrome (previously labeled PNDS),  is so named because it's frequently impossible to sort out how much is  CR/sinusitis with freq throat clearing (which can be related to primary GERD)   vs  causing  secondary (" extra esophageal")  GERD from wide swings in gastric pressure that occur with throat clearing, often  promoting self use of mint and menthol lozenges that reduce the lower esophageal sphincter tone and exacerbate the problem further in a cyclical fashion.   These are the same pts (now being labeled as having "irritable larynx syndrome" by some cough centers)  who not infrequently have a history of having failed to tolerate ace inhibitors,  dry powder inhalers or biphosphonates or report having atypical/extraesophageal reflux symptoms that don't respond to standard doses of PPI  and are easily confused as having aecopd or asthma flares by even experienced allergists/ pulmonologists (myself included).   First try off acei and trelegy, then regroup  In 6 weeks if not better to his satisfaction     HTN (hypertension), benign D/c acei 04/22/2020 due to cough and unexplained sob   In the best review of chronic cough to date (  NEJM 2016 375 [id number]) ,  ACEi are now felt to cause cough in up to  20% of pts which is a 4 fold increase from previous reports and does not include the variety of non-specific complaints we see in pulmonary clinic in pts on ACEi but previously attributed to another dx like  Copd/asthma and  include PNDS, throat and chest congestion, "bronchitis", unexplained dyspnea and noct "strangling" sensations, and hoarseness, but also  atypical /refractory GERD symptoms like dysphagia and "bad heartburn"   The only way I know  to prove this is not an "ACEi Case" is a trial off ACEi x a minimum of 6 weeks then regroup if still not better to his satisfaction             DOE (dyspnea on exertion) Onset around 2017 with neg cards w/u by Hochrein  -   04/22/2020   Walked RA  2 laps @ approx 259ft each @ moderate pace  stopped due to end of study, min sob with sats 96%    When respiratory symptoms begin or become refractory or unexplained well after a patient reports complete smoking cessation,  Especially when this wasn't the case while they were smoking, a red flag is raised based on the work of Dr Kris Mouton which states:  if you quit smoking when your best day FEV1 is still well preserved it is highly unlikely you will progress to severe disease.  That is to say, once the smoking stops,  the symptoms should not suddenly erupt or markedly worsen.   If so, the differential diagnosis should include  obesity/deconditioning,  LPR/Reflux/Aspiration syndromes,  occult CHF, or  especially side effect of medications commonly used in this population, esp acei.    rec rx gerd/ d/c acei and re-eval with pfts in 6 weeks if not improving but I stronlgy doubt he has significant copd            Each maintenance medication was reviewed in detail including emphasizing most importantly the difference between maintenance and prns and under what circumstances the prns are to be triggered using an action plan format where appropriate.  Total time for H and P, chart review, counseling,  directly observing portions of ambulatory 02 saturation study/ and generating customized AVS unique to this office visit / same day charting =  62 min           Christinia Gully, MD 04/22/2020

## 2020-04-22 NOTE — Assessment & Plan Note (Signed)
Onset around 2017 assob with sob/ never noct - d/c acei 04/22/2020 as well as dpi  Upper airway cough syndrome (previously labeled PNDS),  is so named because it's frequently impossible to sort out how much is  CR/sinusitis with freq throat clearing (which can be related to primary GERD)   vs  causing  secondary (" extra esophageal")  GERD from wide swings in gastric pressure that occur with throat clearing, often  promoting self use of mint and menthol lozenges that reduce the lower esophageal sphincter tone and exacerbate the problem further in a cyclical fashion.   These are the same pts (now being labeled as having "irritable larynx syndrome" by some cough centers) who not infrequently have a history of having failed to tolerate ace inhibitors,  dry powder inhalers or biphosphonates or report having atypical/extraesophageal reflux symptoms that don't respond to standard doses of PPI  and are easily confused as having aecopd or asthma flares by even experienced allergists/ pulmonologists (myself included).   First try off acei and trelegy, then regroup  In 6 weeks if not better to his satisfaction

## 2020-04-23 ENCOUNTER — Other Ambulatory Visit: Payer: Self-pay | Admitting: Internal Medicine

## 2020-04-23 DIAGNOSIS — R0609 Other forms of dyspnea: Secondary | ICD-10-CM

## 2020-04-23 DIAGNOSIS — R058 Other specified cough: Secondary | ICD-10-CM

## 2020-04-23 DIAGNOSIS — R0600 Dyspnea, unspecified: Secondary | ICD-10-CM

## 2020-04-23 MED ORDER — AZITHROMYCIN 250 MG PO TABS: 250.0000 mg | ORAL_TABLET | ORAL | 0 refills | Status: DC

## 2020-04-23 NOTE — Progress Notes (Signed)
Spoke with the pt and notified of recs per MW  He verbalized understanding and was agreeable to plan- rx sent and cxr ordered

## 2020-04-28 ENCOUNTER — Telehealth: Payer: Self-pay | Admitting: Internal Medicine

## 2020-04-28 MED ORDER — VALSARTAN 160 MG PO TABS: 160.0000 mg | ORAL_TABLET | Freq: Every day | ORAL | 3 refills | Status: DC

## 2020-04-28 NOTE — Telephone Encounter (Signed)
Received a PA request for olmesartan 20mg . Patient's insurance requires that he at least try and fail one of the following medications: losartan, valsartan, telmisartan or ibesartan before they will be pay for the olmesartan. I searched his chart to see if he has ever tried any of these. I did not see any documentation.   MW, please advise if you would like to switch him to one of the covered medications: losartan, valsartan, telmisartan or ibesartan. Thanks!

## 2020-04-28 NOTE — Telephone Encounter (Signed)
I have changed the medication per MW>  Nothing further is needed.

## 2020-04-28 NOTE — Telephone Encounter (Signed)
Valsartan 160 mg daily should do - can take twice daily if bp not at target

## 2020-05-01 ENCOUNTER — Telehealth: Payer: Self-pay

## 2020-05-01 DIAGNOSIS — J439 Emphysema, unspecified: Secondary | ICD-10-CM

## 2020-05-01 MED ORDER — ALBUTEROL SULFATE HFA 108 (90 BASE) MCG/ACT IN AERS: 2.0000 | INHALATION_SPRAY | Freq: Four times a day (QID) | RESPIRATORY_TRACT | 0 refills | Status: DC | PRN

## 2020-05-01 NOTE — Telephone Encounter (Signed)
8 gram inhaler sent to pharmacy per patient request.  Patient aware

## 2020-05-07 ENCOUNTER — Ambulatory Visit (INDEPENDENT_AMBULATORY_CARE_PROVIDER_SITE_OTHER): Payer: Medicare Other

## 2020-05-07 DIAGNOSIS — R0609 Other forms of dyspnea: Secondary | ICD-10-CM

## 2020-05-07 DIAGNOSIS — R0602 Shortness of breath: Secondary | ICD-10-CM | POA: Diagnosis not present

## 2020-05-07 DIAGNOSIS — J189 Pneumonia, unspecified organism: Secondary | ICD-10-CM | POA: Diagnosis not present

## 2020-05-07 DIAGNOSIS — R0600 Dyspnea, unspecified: Secondary | ICD-10-CM | POA: Diagnosis not present

## 2020-05-11 NOTE — Progress Notes (Signed)
Spoke with pt and notified of results per Dr. Wert. Pt verbalized understanding and denied any questions. 

## 2020-05-12 NOTE — Progress Notes (Signed)
Cardiology Office Note   Date:  05/13/2020   ID:  [name], DOB 1935/06/26, MRN [id number]  PCP:  Dettinger, Fransisca Kaufmann, MD  Cardiologist:   Minus Breeding, MD    Chief Complaint  Patient presents with  . Shortness of Breath     History of Present Illness: [name] is a 85 y.o. male who presents for evaluation of dyslipidemia and bradycardia.    He's intolerant of all statins. He gets diffuse muscle aches. He does have peripheral vascular disease.  He previously saw Dr. Mare Ferrari once in 2015 for bradycardia.  He had NSR with PVCs/bigeminy.  He apparently did no perfuse the PVCs.  He has had CEA in the past.  He's had negative stress testing some years ago.  He did have an essentially normal echo in 2017.  At the last visit he had SOB and he had negative Lexiscan Myoview.     He is back for follow-up of his known vascular disease.  He has shortness of breath.  This has been unchanged.  Is actually been slightly progressive.  He did see Dr. Melvyn Novas recently who thought this was probably related to ACE inhibitor's.  He said he is 10% better after stopping the ACE inhibitor is good still has shortness of breath with activity such as walking 30 yards on level ground.  He is not describing any PND or orthopnea.  Is not been having any chest pressure, neck or arm discomfort.  He had no weight gain or edema.  Past Medical History:  Diagnosis Date  . Adenomatous colon polyp 11/1999  . Allergy   . Blood transfusion without reported diagnosis   . Cancer [facility])    Prostate  . Carotid artery occlusion   . Cataract   . COPD (chronic obstructive pulmonary disease) ([place])   . GERD (gastroesophageal reflux disease)   . Hyperlipidemia   . Hypertension   . Internal hemorrhoids   . PVC (premature ventricular contraction)     Past Surgical History:  Procedure Laterality Date  . APPENDECTOMY    . carotid surgery    . CATARACT EXTRACTION     both eyes  . COLONOSCOPY    . POLYPECTOMY     . TRANSURETHRAL RESECTION OF PROSTATE    . UPPER GASTROINTESTINAL ENDOSCOPY    . UPPER GI ENDOSCOPY  Nov. 3, 2014   Upper endoscopy with diatation     Current Outpatient Medications  Medication Sig Dispense Refill  . albuterol (VENTOLIN HFA) 108 (90 Base) MCG/ACT inhaler Inhale 2 puffs into the lungs every 6 (six) hours as needed for wheezing or shortness of breath. 8 g 0  . aspirin 81 MG tablet Take 81 mg by mouth 2 (two) times daily.     . cetirizine (ZYRTEC) 10 MG tablet TAKE 1 TABLET DAILY 90 tablet 1  . furosemide (LASIX) 20 MG tablet Take 20 mg by mouth daily.    Marland Kitchen MYRBETRIQ 50 MG TB24 tablet TAKE 1 TABLET DAILY 90 tablet 3  . omeprazole (PRILOSEC) 20 MG capsule TAKE 1 CAPSULE DAILY 90 capsule 0  . QUEtiapine (SEROQUEL) 50 MG tablet Take 1 tablet (50 mg total) by mouth at bedtime. 90 tablet 3  . REPATHA SURECLICK 939 MG/ML SOAJ INJECT 140 MG INTO THE SKIN EVERY 14 DAYS 6 mL 3  . Vitamin D, Ergocalciferol, (DRISDOL) 1.25 MG (50000 UNIT) CAPS capsule Take 1 capsule (50,000 Units total) by mouth once a week. 12 capsule 3  . valsartan (DIOVAN) 160  MG tablet Take 1 tablet (160 mg total) by mouth 2 (two) times daily. 60 tablet 11   No current facility-administered medications for this visit.    Allergies:   Lamisil [terbinafine hcl]    ROS:  Please see the history of present illness.   Otherwise, review of systems are positive for none.   All other systems are reviewed and negative.    PHYSICAL EXAM: VS:  BP (!) 150/80   Pulse 78   Ht 5\' 10"  (1.778 m)   Wt 190 lb (86.2 kg)   BMI 27.26 kg/m  , BMI Body mass index is 27.26 kg/m.  GENERAL:  Well appearing NECK:  No jugular venous distention, waveform within normal limits, carotid upstroke brisk and symmetric, right CEA, left bruit, no thyromegaly LUNGS:  Clear to auscultation bilaterally CHEST:  Unremarkable HEART:  PMI not displaced or sustained,S1 and S2 within normal limits, no S3, no S4, no clicks, no rubs, no  murmurs ABD:  Flat, positive bowel sounds normal in frequency in pitch, no bruits, no rebound, no guarding, no midline pulsatile mass, no hepatomegaly, no splenomegaly EXT:  2 plus pulses throughout, trace edema, no cyanosis no clubbing   EKG:  EKG is  ordered today. Normal sinus rhythm, rate 78, axis within normal limits, early transition in lead V2, premature ventricular contractions, no acute ST-T wave changes.   Recent Labs: 03/05/2020: ALT 17; BUN 12; Creatinine, Ser 1.32; Hemoglobin 10.2; Platelets 250; Potassium 4.3; Sodium 139    Lipid Panel    Component Value Date/Time   CHOL 119 12/05/2019 0940   TRIG 123 12/05/2019 0940   HDL 37 (L) 12/05/2019 0940   CHOLHDL 3.2 12/05/2019 0940   LDLCALC 60 12/05/2019 0940      Wt Readings from Last 3 Encounters:  05/13/20 190 lb (86.2 kg)  04/22/20 189 lb (85.7 kg)  03/05/20 193 lb 2 oz (87.6 kg)      Other studies Reviewed: Additional studies/ records that were reviewed today include: Pulmonary Office Records Review of the above records demonstrates: See elsewhere   ASSESSMENT AND PLAN:  SOB:     I do not have a cardiac etiology for this and would like for him to complete his pulmonary work-up and any change in treatment.  However there is not a clear pulmonary etiology and if not improved I might consider further testing such cardiopulmonary stress testing.  H  CAROTID STENOSIS: This is followed by [facility]  DYSLIPIDEMIA:    He has an excellent lipid profile on Repatha.  No change in therapy.   HTN:  The blood pressure is not controlled on the current new ARB.  I will have him increase his valsartan to twice daily.   PVCs:   He is not bothering him.  No change in therapy.  Current medicines are reviewed at length with the patient today.  The patient does not have concerns regarding medicines.  The following changes have been made: As above  Labs/ tests ordered today include:   Orders Placed This Encounter  Procedures   . EKG 12-Lead     Disposition:   Follow-up with me in 3 months  Signed, Minus Breeding, MD  05/13/2020 3:43 PM    [facility]

## 2020-05-13 ENCOUNTER — Other Ambulatory Visit: Payer: Self-pay

## 2020-05-13 ENCOUNTER — Ambulatory Visit (INDEPENDENT_AMBULATORY_CARE_PROVIDER_SITE_OTHER): Payer: Medicare Other | Admitting: Cardiology

## 2020-05-13 ENCOUNTER — Encounter: Payer: Self-pay | Admitting: Cardiology

## 2020-05-13 VITALS — BP 150/80 | HR 78 | Ht 70.0 in | Wt 190.0 lb

## 2020-05-13 DIAGNOSIS — I10 Essential (primary) hypertension: Secondary | ICD-10-CM

## 2020-05-13 DIAGNOSIS — I493 Ventricular premature depolarization: Secondary | ICD-10-CM

## 2020-05-13 DIAGNOSIS — E785 Hyperlipidemia, unspecified: Secondary | ICD-10-CM | POA: Diagnosis not present

## 2020-05-13 DIAGNOSIS — R0602 Shortness of breath: Secondary | ICD-10-CM | POA: Diagnosis not present

## 2020-05-13 MED ORDER — VALSARTAN 160 MG PO TABS: 160.0000 mg | ORAL_TABLET | Freq: Two times a day (BID) | ORAL | 11 refills | Status: DC

## 2020-05-13 NOTE — Patient Instructions (Signed)
Medication Instructions:  Please increase your Valsartan to 160 mg twice a day. Continue all other medications as listed.  *If you need a refill on your cardiac medications before your next appointment, please call your pharmacy*  Follow-Up: At [facility], you and your health needs are our priority.  As part of our continuing mission to provide you with exceptional heart care, we have created designated Provider Care Teams.  These Care Teams include your primary Cardiologist (physician) and Advanced Practice Providers (APPs -  Physician Assistants and Nurse Practitioners) who all work together to provide you with the care you need, when you need it.  We recommend signing up for the patient portal called "MyChart".  Sign up information is provided on this After Visit Summary.  MyChart is used to connect with patients for Virtual Visits (Telemedicine).  Patients are able to view lab/test results, encounter notes, upcoming appointments, etc.  Non-urgent messages can be sent to your provider as well.   To learn more about what you can do with MyChart, go to [email or web address].    Your next appointment:   3 month(s)  The format for your next appointment:   In Person  Provider:   Minus Breeding, MD   Thank you for choosing [facility]!!

## 2020-05-16 ENCOUNTER — Other Ambulatory Visit: Payer: Self-pay | Admitting: Family Medicine

## 2020-06-03 ENCOUNTER — Other Ambulatory Visit: Payer: Self-pay

## 2020-06-03 ENCOUNTER — Ambulatory Visit (INDEPENDENT_AMBULATORY_CARE_PROVIDER_SITE_OTHER): Payer: Medicare Other | Admitting: Internal Medicine

## 2020-06-03 ENCOUNTER — Encounter: Payer: Self-pay | Admitting: Internal Medicine

## 2020-06-03 DIAGNOSIS — R058 Other specified cough: Secondary | ICD-10-CM

## 2020-06-03 DIAGNOSIS — R0609 Other forms of dyspnea: Secondary | ICD-10-CM

## 2020-06-03 DIAGNOSIS — R0600 Dyspnea, unspecified: Secondary | ICD-10-CM | POA: Diagnosis not present

## 2020-06-03 MED ORDER — PREDNISONE 10 MG PO TABS: ORAL_TABLET | ORAL | 0 refills | Status: DC

## 2020-06-03 MED ORDER — PANTOPRAZOLE SODIUM 40 MG PO TBEC: 40.0000 mg | DELAYED_RELEASE_TABLET | Freq: Every day | ORAL | 2 refills | Status: DC

## 2020-06-03 MED ORDER — FAMOTIDINE 20 MG PO TABS: ORAL_TABLET | ORAL | 11 refills | Status: DC

## 2020-06-03 NOTE — Progress Notes (Signed)
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 Pulmonary Consult    Referred by Dr Warrick Parisian. Pt c/o SOB x 5 years, worse x 1 1/5 years. He states he gets winded walking to the mailbox. He is using his ventolin inhaler 4 x per wk on average.   Dyspnea:  Daily 250 ft uphill to mailbox can make it s stopping at slower pace Cough: dry, daytime Sleep: fine on side/ 2 pillows / flat bad SABA use: not really helping, never prechallenges or rechallenges  rec Stop ramapril  Start on ibesartan 20 mg daily and your symptoms should gradually resolve Stop trelegy  Only use your albuterol as a rescue medication to be used if you can't catch your breath Change prilosec Take 30-60 min before first meal of the day    06/03/2020  f/u ov/[name] re: UACS/? ACE case  Chief Complaint  Patient presents with  . Follow-up    Breathing is unchanged since the last visit. He has not been exerting himself much at all. He rarely uses the albuterol inhaler. He states he been wheezing more often. He has cough occ with clear sputum.   Dyspnea:  No longer mailbox  Cough: assoc with watery rhinitis x years aware of it at hs but blows nose and then ok for rest of noct "allergy much worse when stopped prilosec in past"  Sleeping: bed is flat /on side 2 pillows  SABA use: not using  02: none  Covid status:   X 3    No obvious day to day or daytime variability or assoc excess/ purulent sputum or mucus plugs or hemoptysis or cp or chest tightness, subjective wheeze or overt r  hb symptoms.   Sleeping  without nocturnal  or early am exacerbation  of respiratory  c/o's or need for noct saba. Also denies any obvious fluctuation of symptoms with weather or environmental changes or other aggravating or alleviating factors except as outlined above   No unusual exposure hx or h/o childhood pna/ asthma or knowledge of premature birth.  Current Allergies, Complete Past Medical History, Past Surgical History, Family History, and Social History were reviewed in [employer] record.  ROS  The following are not active complaints unless bolded Hoarseness, sore throat, dysphagia, dental problems, itching, sneezing,  nasal congestion or discharge of excess mucus or purulent secretions, ear ache,   fever, chills, sweats, unintended wt loss or wt gain, classically pleuritic or exertional cp,  orthopnea pnd or arm/hand swelling  or leg swelling, presyncope, palpitations, abdominal pain, anorexia, nausea, vomiting, diarrhea  or change in bowel habits or change in bladder habits, change in stools or change in urine, dysuria, hematuria,  rash, arthralgias, visual complaints, headache, numbness, weakness or ataxia or problems with walking or coordination,  change in mood or  memory.        Current Meds  Medication Sig  . albuterol (VENTOLIN HFA) 108 (90 Base) MCG/ACT inhaler Inhale 2 puffs into the lungs  every 6 (six) hours as needed for wheezing or shortness of breath.  Marland Kitchen aspirin 81 MG tablet Take 81 mg by mouth 2 (two) times daily.   . cetirizine (ZYRTEC) 10 MG tablet TAKE 1 TABLET DAILY  . furosemide (LASIX) 20 MG tablet Take 20 mg by mouth daily.  Marland Kitchen MYRBETRIQ 50 MG TB24 tablet TAKE 1 TABLET DAILY  . omeprazole (PRILOSEC) 20 MG capsule TAKE 1 CAPSULE DAILY  . QUEtiapine (SEROQUEL) 50 MG tablet Take 1 tablet (50 mg total) by mouth at bedtime.  Marland Kitchen REPATHA SURECLICK 546 MG/ML SOAJ INJECT 140 MG INTO THE SKIN EVERY 14 DAYS  . valsartan (DIOVAN) 160 MG tablet Take 1  tablet (160 mg total) by mouth 2 (two) times daily.  . Vitamin D, Ergocalciferol, (DRISDOL) 1.25 MG (50000 UNIT) CAPS capsule Take 1 capsule (50,000 Units total) by mouth once a week.                Past Medical History:  Diagnosis Date  . Adenomatous colon polyp 11/1999  . Allergy   . Blood transfusion without reported diagnosis   . Cancer [facility])    Prostate  . Carotid artery occlusion   . Cataract   . COPD (chronic obstructive pulmonary disease) ([place])   . GERD (gastroesophageal reflux disease)   . Hyperlipidemia   . Hypertension   . Internal hemorrhoids   . PVC (premature ventricular contraction)       Objective:       Wt Readings from Last 3 Encounters:  06/03/20 191 lb 12.8 oz (87 kg)  05/13/20 190 lb (86.2 kg)  04/22/20 189 lb (85.7 kg)      Vital signs reviewed  06/03/2020  - Note at rest 02 sats  98% on RA   General appearance:    amb slt hoarse wm nad     HEENT : pt wearing mask not removed for exam due to covid -19 concerns.    NECK :  without JVD/Nodes/TM/ nl carotid upstrokes bilaterally   LUNGS: no acc muscle use,  Nl contour chest which is clear to A and P bilaterally without cough on insp or exp maneuvers   CV:  RRR  no s3 or murmur or increase in P2, and no edema   ABD:  soft and nontender with nl inspiratory excursion in the supine position. No bruits or organomegaly appreciated, bowel sounds nl  MS:  Nl gait/ ext warm without deformities, calf tenderness, cyanosis or clubbing No obvious joint restrictions   SKIN: warm and dry without lesions    NEURO:  alert, approp, nl sensorium with  no motor or cerebellar deficits apparent.            Assessment

## 2020-06-03 NOTE — Patient Instructions (Addendum)
To get the most out of exercise, you need to be continuously aware that you are short of breath, but never out of breath, for 30 minutes daily. As you improve, it will actually be easier for you to do the same amount of exercise  in  30 minutes so always push to the level where you are short of breath.    Ok to Try albuterol 15 min before an activity that you know would make you short of breath and see if it makes any difference and if makes none then don't take it after activity unless you can't catch your breath.      Stop prilosec and Pantoprazole (protonix) 40 mg (same 2 prilosec)   Take  30-60 min before first meal of the day and Pepcid (famotidine)  20 mg after supper  until return to office - this is the best way to tell whether stomach acid is contributing to your problem.    GERD (REFLUX)  is an extremely common cause of respiratory symptoms just like yours , many times with no obvious heartburn at all.    It can be treated with medication, but also with lifestyle changes including elevation of the head of your bed (ideally with 6-8inch blocks under the headboard of your bed),  Smoking cessation, avoidance of late meals, excessive alcohol, and avoid fatty foods, chocolate, peppermint, colas, red wine, and acidic juices such as orange juice.  NO MINT OR MENTHOL PRODUCTS SO NO COUGH DROPS  USE SUGARLESS CANDY INSTEAD (Jolley ranchers or Stover's or Life Savers) or even ice chips will also do - the key is to swallow to prevent all throat clearing. NO OIL BASED VITAMINS - use powdered substitutes.  Avoid fish oil when coughing.   Prednisone 10 mg take  4 each am x 2 days,   2 each am x 2 days,  1 each am x 2 days and stop    Please schedule a follow up office visit in 6 weeks, call sooner if needed with PFTs

## 2020-06-03 NOTE — Assessment & Plan Note (Signed)
Onset around 2017 assoc with sob/ never noct - d/c acei 04/22/2020  - max gerd rx 06/03/2020 >>>   Upper airway cough syndrome (previously labeled PNDS),  is so named because it's frequently impossible to sort out how much is  CR/sinusitis with freq throat clearing (which can be related to primary GERD)   vs  causing  secondary (" extra esophageal")  GERD from wide swings in gastric pressure that occur with throat clearing, often  promoting self use of mint and menthol lozenges that reduce the lower esophageal sphincter tone and exacerbate the problem further in a cyclical fashion.   These are the same pts (now being labeled as having "irritable larynx syndrome" by some cough centers) who not infrequently have a history of having failed to tolerate ace inhibitors(ramapril d/c'd already),  dry powder inhalers (trelegy d/c'd already) or biphosphonates or report having atypical/extraesophageal reflux symptoms that don't respond to standard doses of PPI (much worse "allergy"  symptoms when prilosec not taken) and are easily confused as having aecopd or asthma flares by even experienced allergists/ pulmonologists (myself included).   >>> try max rx for gerd >>> also so added 6 day taper off  Prednisone starting at 40 mg per day in case of component of Th-2 driven upper or lower airways inflammation (if cough responds short term only to relapse before return while will on full rx for uacs (as above), then  that would point to allergic rhinitis/ asthma or eos bronchitis as alternative dx).           Each maintenance medication was reviewed in detail including emphasizing most importantly the difference between maintenance and prns and under what circumstances the prns are to be triggered using an action plan format where appropriate.  Total time for H and P, chart review, counseling, reviewing hfa device(s) and generating customized AVS unique to this office visit / same day charting =32 min

## 2020-06-03 NOTE — Assessment & Plan Note (Addendum)
Onset around 2017 with neg cards w/u by Hochrein  -   04/22/2020   Walked RA  2 laps @ approx 243ft each @ moderate pace  stopped due to end of study, min sob with sats 96%  - 06/03/2020  After extensive coaching inhaler device,  effectiveness =    75%  So ok to try ventolin x 2 pffs 15 min before ex   I spent extra time with pt today reviewing appropriate use of albuterol for prn use on exertion with the following points: 1) saba is for relief of sob that does not improve by walking a slower pace or resting but rather if the pt does not improve after trying this first. 2) If the pt is convinced, as many are, that saba helps recover from activity faster then it's easy to tell if this is the case by re-challenging : ie stop, take the inhaler, then p 5 minutes try the exact same activity (intensity of workload) that just caused the symptoms and see if they are substantially diminished or not after saba 3) if there is an activity that reproducibly causes the symptoms, try the saba 15 min before the activity on alternate days   If in fact the saba really does help, then fine to continue to use it prn but advised may need to look closer at the maintenance regimen being used to achieve better control of airways disease with exertion.   Return for pfts in 6 weeks

## 2020-06-08 ENCOUNTER — Other Ambulatory Visit: Payer: Self-pay

## 2020-06-08 ENCOUNTER — Encounter: Payer: Self-pay | Admitting: Family Medicine

## 2020-06-08 ENCOUNTER — Ambulatory Visit (INDEPENDENT_AMBULATORY_CARE_PROVIDER_SITE_OTHER): Payer: Medicare Other | Admitting: Family Medicine

## 2020-06-08 VITALS — BP 157/80 | HR 61 | Ht 70.0 in | Wt 194.0 lb

## 2020-06-08 DIAGNOSIS — M5432 Sciatica, left side: Secondary | ICD-10-CM | POA: Diagnosis not present

## 2020-06-08 DIAGNOSIS — N3281 Overactive bladder: Secondary | ICD-10-CM | POA: Diagnosis not present

## 2020-06-08 DIAGNOSIS — J439 Emphysema, unspecified: Secondary | ICD-10-CM | POA: Diagnosis not present

## 2020-06-08 DIAGNOSIS — I10 Essential (primary) hypertension: Secondary | ICD-10-CM

## 2020-06-08 DIAGNOSIS — F5104 Psychophysiologic insomnia: Secondary | ICD-10-CM | POA: Diagnosis not present

## 2020-06-08 DIAGNOSIS — E785 Hyperlipidemia, unspecified: Secondary | ICD-10-CM | POA: Diagnosis not present

## 2020-06-08 MED ORDER — AMLODIPINE BESYLATE 5 MG PO TABS: 5.0000 mg | ORAL_TABLET | Freq: Every day | ORAL | 1 refills | Status: DC

## 2020-06-08 MED ORDER — FUROSEMIDE 20 MG PO TABS: 20.0000 mg | ORAL_TABLET | Freq: Every day | ORAL | 3 refills | Status: DC

## 2020-06-08 MED ORDER — MIRABEGRON ER 50 MG PO TB24: 50.0000 mg | ORAL_TABLET | Freq: Every day | ORAL | 3 refills | Status: DC

## 2020-06-08 MED ORDER — TRAMADOL HCL 50 MG PO TABS: 50.0000 mg | ORAL_TABLET | Freq: Every day | ORAL | 0 refills | Status: DC | PRN

## 2020-06-08 MED ORDER — QUETIAPINE FUMARATE 50 MG PO TABS
50.0000 mg | ORAL_TABLET | Freq: Every day | ORAL | 3 refills | Status: DC
Start: 2020-06-08 — End: 2021-06-11

## 2020-06-08 MED ORDER — CETIRIZINE HCL 10 MG PO TABS: 10.0000 mg | ORAL_TABLET | Freq: Every day | ORAL | 3 refills | Status: DC

## 2020-06-08 NOTE — Progress Notes (Signed)
BP (!) 157/80   Pulse 61   Ht 5' 10" (1.778 m)   Wt 194 lb (88 kg)   SpO2 97%   BMI 27.84 kg/m    Subjective:   Patient ID: [name], male    DOB: Feb 24, 1936, 85 y.o.   MRN: [id number]  HPI: [name] is a 85 y.o. male presenting on 06/08/2020 for Medical Management of Chronic Issues and Hypertension   HPI Hypertension Patient is currently on valsartan, and their blood pressure today is 157/80. Patient denies any lightheadedness or dizziness. Patient denies headaches, blurred vision, chest pains, shortness of breath, or weakness. Denies any side effects from medication and is content with current medication.  Patient's blood pressures at home and still been running up as well in the 150s and 160s or higher.  He says is been taking it consistently and still getting elevated blood pressures.  He denies any lightheadedness or dizziness.  Hyperlipidemia Patient is coming in for recheck of his hyperlipidemia. The patient is currently taking Repatha. They deny any issues with myalgias or history of liver damage from it. They deny any focal numbness or weakness or chest pain.   Overactive bladder recheck Patient takes Myrbetriq and seems to be helping.  We will continue on her better, denies any side effects from it.  Patient has left posterior hip pain that is been bothering him off and on but increasing over the past 6 months.  He says it hurts more when he is up and walking and not as much when he sitting.  He does admit that he has been more sedentary recently and that has contributed to hurting worse.  Relevant past medical, surgical, family and social history reviewed and updated as indicated. Interim medical history since our last visit reviewed. Allergies and medications reviewed and updated.  Review of Systems  Constitutional: Negative for chills and fever.  Respiratory: Negative for shortness of breath and wheezing.   Cardiovascular: Negative for chest pain and leg  swelling.  Musculoskeletal: Positive for arthralgias. Negative for back pain and gait problem.  Skin: Negative for rash.  All other systems reviewed and are negative.   Per HPI unless specifically indicated above   Allergies as of 06/08/2020      Reactions   Lamisil [terbinafine Hcl] Other (See Comments)   Loss of taste       Medication List       Accurate as of June 08, 2020  8:35 AM. If you have any questions, ask your nurse or doctor.        albuterol 108 (90 Base) MCG/ACT inhaler Commonly known as: VENTOLIN HFA Inhale 2 puffs into the lungs every 6 (six) hours as needed for wheezing or shortness of breath.   amLODipine 5 MG tablet Commonly known as: NORVASC Take 1 tablet (5 mg total) by mouth daily. Started by: Worthy Rancher, MD   aspirin 81 MG tablet Take 81 mg by mouth 2 (two) times daily.   cetirizine 10 MG tablet Commonly known as: ZYRTEC Take 1 tablet (10 mg total) by mouth daily.   famotidine 20 MG tablet Commonly known as: Pepcid One after supper   furosemide 20 MG tablet Commonly known as: LASIX Take 1 tablet (20 mg total) by mouth daily.   mirabegron ER 50 MG Tb24 tablet Commonly known as: Myrbetriq Take 1 tablet (50 mg total) by mouth daily. What changed: how much to take Changed by: Worthy Rancher, MD   pantoprazole 40 MG  tablet Commonly known as: Protonix Take 1 tablet (40 mg total) by mouth daily. Take 30-60 min before first meal of the day   predniSONE 10 MG tablet Commonly known as: DELTASONE Take  4 each am x 2 days,   2 each am x 2 days,  1 each am x 2 days and stop   QUEtiapine 50 MG tablet Commonly known as: SEROQUEL Take 1 tablet (50 mg total) by mouth at bedtime.   Repatha SureClick 562 MG/ML Soaj Generic drug: Evolocumab INJECT 140 MG INTO THE SKIN EVERY 14 DAYS   traMADol 50 MG tablet Commonly known as: ULTRAM Take 1 tablet (50 mg total) by mouth daily as needed. Started by: Fransisca Kaufmann Dettinger, MD   valsartan  160 MG tablet Commonly known as: Diovan Take 1 tablet (160 mg total) by mouth 2 (two) times daily.   Vitamin D (Ergocalciferol) 1.25 MG (50000 UNIT) Caps capsule Commonly known as: DRISDOL Take 1 capsule (50,000 Units total) by mouth once a week.        Objective:   BP (!) 157/80   Pulse 61   Ht 5' 10" (1.778 m)   Wt 194 lb (88 kg)   SpO2 97%   BMI 27.84 kg/m   Wt Readings from Last 3 Encounters:  06/08/20 194 lb (88 kg)  06/03/20 191 lb 12.8 oz (87 kg)  05/13/20 190 lb (86.2 kg)    Physical Exam Vitals and nursing note reviewed.  Constitutional:      General: He is not in acute distress.    Appearance: He is well-developed. He is not diaphoretic.  Eyes:     General: No scleral icterus.    Conjunctiva/sclera: Conjunctivae normal.  Neck:     Thyroid: No thyromegaly.  Cardiovascular:     Rate and Rhythm: Normal rate and regular rhythm.     Heart sounds: Normal heart sounds. No murmur heard.   Pulmonary:     Effort: Pulmonary effort is normal. No respiratory distress.     Breath sounds: Normal breath sounds. No wheezing.  Musculoskeletal:        General: Normal range of motion.     Cervical back: Neck supple.     Lumbar back: Tenderness present. Negative right straight leg raise test and negative left straight leg raise test.       Back:     Left hip: No tenderness or bony tenderness. Normal range of motion.  Lymphadenopathy:     Cervical: No cervical adenopathy.  Skin:    General: Skin is warm and dry.     Findings: No rash.  Neurological:     Mental Status: He is alert and oriented to person, place, and time.     Coordination: Coordination normal.  Psychiatric:        Behavior: Behavior normal.       Assessment & Plan:   Problem List Items Addressed This Visit      Cardiovascular and Mediastinum   HTN (hypertension), benign   Relevant Medications   furosemide (LASIX) 20 MG tablet   amLODipine (NORVASC) 5 MG tablet   Other Relevant Orders    CMP14+EGFR     Respiratory   COPD (chronic obstructive pulmonary disease) (HCC)   Relevant Medications   cetirizine (ZYRTEC) 10 MG tablet     Genitourinary   Overactive bladder   Relevant Medications   mirabegron ER (MYRBETRIQ) 50 MG TB24 tablet   Other Relevant Orders   CBC with Differential/Platelet  Other   Hyperlipidemia LDL goal <100 - Primary   Relevant Medications   furosemide (LASIX) 20 MG tablet   amLODipine (NORVASC) 5 MG tablet   Other Relevant Orders   CMP14+EGFR   Lipid panel    Other Visit Diagnoses    Psychophysiological insomnia       Relevant Medications   QUEtiapine (SEROQUEL) 50 MG tablet   Other Relevant Orders   CBC with Differential/Platelet   Left sciatic nerve pain       Relevant Medications   QUEtiapine (SEROQUEL) 50 MG tablet   traMADol (ULTRAM) 50 MG tablet      Will give him a little bit of tramadol to help when the pain gets really bad, will start amlodipine to help with blood pressures.  Continue other medicine.   Follow up plan: Return in about 3 months (around 09/08/2020), or if symptoms worsen or fail to improve, for Hypertension and hyperlipidemia and insomnia.  Counseling provided for all of the vaccine components Orders Placed This Encounter  Procedures  . CBC with Differential/Platelet  . CMP14+EGFR  . Lipid panel    Caryl Pina, MD [facility] 06/08/2020, 8:35 AM

## 2020-06-09 LAB — CMP14+EGFR
ALT: 32 IU/L (ref 0–44)
AST: 35 IU/L (ref 0–40)
Albumin/Globulin Ratio: 1.8 (ref 1.2–2.2)
Albumin: 4.2 g/dL (ref 3.6–4.6)
Alkaline Phosphatase: 84 IU/L (ref 44–121)
BUN/Creatinine Ratio: 16 (ref 10–24)
BUN: 17 mg/dL (ref 8–27)
Bilirubin Total: <0.2 mg/dL (ref 0.0–1.2)
CO2: 21 mmol/L (ref 20–29)
Calcium: 9 mg/dL (ref 8.6–10.2)
Chloride: 103 mmol/L (ref 96–106)
Creatinine, Ser: 1.06 mg/dL (ref 0.76–1.27)
Globulin, Total: 2.3 g/dL (ref 1.5–4.5)
Glucose: 119 mg/dL — ABNORMAL HIGH (ref 65–99)
Potassium: 3.5 mmol/L (ref 3.5–5.2)
Sodium: 141 mmol/L (ref 134–144)
Total Protein: 6.5 g/dL (ref 6.0–8.5)
eGFR: 70 mL/min/1.73

## 2020-06-09 LAB — CBC WITH DIFFERENTIAL/PLATELET
Basophils Absolute: 0.1 x10E3/uL (ref 0.0–0.2)
Basos: 1 %
EOS (ABSOLUTE): 0.2 x10E3/uL (ref 0.0–0.4)
Eos: 2 %
Hematocrit: 39.4 % (ref 37.5–51.0)
Hemoglobin: 12.9 g/dL — ABNORMAL LOW (ref 13.0–17.7)
Immature Grans (Abs): 0.2 x10E3/uL — ABNORMAL HIGH (ref 0.0–0.1)
Immature Granulocytes: 2 %
Lymphocytes Absolute: 3 x10E3/uL (ref 0.7–3.1)
Lymphs: 32 %
MCH: 28.9 pg (ref 26.6–33.0)
MCHC: 32.7 g/dL (ref 31.5–35.7)
MCV: 88 fL (ref 79–97)
Monocytes Absolute: 0.6 x10E3/uL (ref 0.1–0.9)
Monocytes: 7 %
Neutrophils Absolute: 5.2 x10E3/uL (ref 1.4–7.0)
Neutrophils: 56 %
Platelets: 192 x10E3/uL (ref 150–450)
RBC: 4.47 x10E6/uL (ref 4.14–5.80)
RDW: 15.9 % — ABNORMAL HIGH (ref 11.6–15.4)
WBC: 9.4 x10E3/uL (ref 3.4–10.8)

## 2020-06-09 LAB — LIPID PANEL
Chol/HDL Ratio: 3.1 ratio (ref 0.0–5.0)
Cholesterol, Total: 137 mg/dL (ref 100–199)
HDL: 44 mg/dL
LDL Chol Calc (NIH): 62 mg/dL (ref 0–99)
Triglycerides: 187 mg/dL — ABNORMAL HIGH (ref 0–149)
VLDL Cholesterol Cal: 31 mg/dL (ref 5–40)

## 2020-06-22 DIAGNOSIS — H5789 Other specified disorders of eye and adnexa: Secondary | ICD-10-CM | POA: Diagnosis not present

## 2020-06-22 DIAGNOSIS — H5711 Ocular pain, right eye: Secondary | ICD-10-CM | POA: Diagnosis not present

## 2020-07-14 ENCOUNTER — Other Ambulatory Visit (HOSPITAL_COMMUNITY): Payer: Medicare Other

## 2020-07-15 ENCOUNTER — Other Ambulatory Visit (HOSPITAL_COMMUNITY)
Admission: RE | Admit: 2020-07-15 | Discharge: 2020-07-15 | Disposition: A | Discharge status: Home / Self Care | Payer: Medicare Other | Source: Ambulatory Visit | Attending: Internal Medicine | Admitting: Internal Medicine

## 2020-07-15 DIAGNOSIS — Z20822 Contact with and (suspected) exposure to covid-19: Secondary | ICD-10-CM | POA: Diagnosis not present

## 2020-07-15 DIAGNOSIS — Z01812 Encounter for preprocedural laboratory examination: Secondary | ICD-10-CM | POA: Diagnosis not present

## 2020-07-15 LAB — SARS CORONAVIRUS 2 (TAT 6-24 HRS): SARS Coronavirus 2: NEGATIVE

## 2020-07-17 ENCOUNTER — Ambulatory Visit (INDEPENDENT_AMBULATORY_CARE_PROVIDER_SITE_OTHER): Payer: Medicare Other | Admitting: Internal Medicine

## 2020-07-17 ENCOUNTER — Encounter: Payer: Self-pay | Admitting: Internal Medicine

## 2020-07-17 ENCOUNTER — Other Ambulatory Visit: Payer: Self-pay

## 2020-07-17 DIAGNOSIS — J449 Chronic obstructive pulmonary disease, unspecified: Secondary | ICD-10-CM

## 2020-07-17 DIAGNOSIS — R0600 Dyspnea, unspecified: Secondary | ICD-10-CM | POA: Diagnosis not present

## 2020-07-17 DIAGNOSIS — R0609 Other forms of dyspnea: Secondary | ICD-10-CM

## 2020-07-17 DIAGNOSIS — R058 Other specified cough: Secondary | ICD-10-CM | POA: Diagnosis not present

## 2020-07-17 LAB — CBC WITH DIFFERENTIAL/PLATELET
Basophils Absolute: 0.1 10*3/uL (ref 0.0–0.1)
Basophils Relative: 1.7 % (ref 0.0–3.0)
Eosinophils Absolute: 0.4 10*3/uL (ref 0.0–0.7)
Eosinophils Relative: 6.2 % — ABNORMAL HIGH (ref 0.0–5.0)
HCT: 39.1 % (ref 39.0–52.0)
Hemoglobin: 13 g/dL (ref 13.0–17.0)
Lymphocytes Relative: 28.3 % (ref 12.0–46.0)
Lymphs Abs: 1.8 10*3/uL (ref 0.7–4.0)
MCHC: 33.2 g/dL (ref 30.0–36.0)
MCV: 90.6 fl (ref 78.0–100.0)
Monocytes Absolute: 0.5 10*3/uL (ref 0.1–1.0)
Monocytes Relative: 7.4 % (ref 3.0–12.0)
Neutro Abs: 3.7 10*3/uL (ref 1.4–7.7)
Neutrophils Relative %: 56.4 % (ref 43.0–77.0)
Platelets: 168 10*3/uL (ref 150.0–400.0)
RBC: 4.32 Mil/uL (ref 4.22–5.81)
RDW: 14.2 % (ref 11.5–15.5)
WBC: 6.5 10*3/uL (ref 4.0–10.5)

## 2020-07-17 LAB — PULMONARY FUNCTION TEST
DL/VA % pred: 100 %
DL/VA: 3.88 ml/min/mmHg/L
DLCO cor % pred: 77 %
DLCO cor: 18.62 ml/min/mmHg
DLCO unc % pred: 73 %
DLCO unc: 17.67 ml/min/mmHg
FEF 25-75 Post: 1.25 L/sec
FEF 25-75 Pre: 0.89 L/sec
FEF2575-%Change-Post: 40 %
FEF2575-%Pred-Post: 66 %
FEF2575-%Pred-Pre: 47 %
FEV1-%Change-Post: 13 %
FEV1-%Pred-Post: 67 %
FEV1-%Pred-Pre: 59 %
FEV1-Post: 1.88 L
FEV1-Pre: 1.65 L
FEV1FVC-%Change-Post: 0 %
FEV1FVC-%Pred-Pre: 90 %
FEV6-%Change-Post: 11 %
FEV6-%Pred-Post: 77 %
FEV6-%Pred-Pre: 70 %
FEV6-Post: 2.86 L
FEV6-Pre: 2.57 L
FEV6FVC-%Change-Post: -1 %
FEV6FVC-%Pred-Post: 105 %
FEV6FVC-%Pred-Pre: 107 %
FVC-%Change-Post: 12 %
FVC-%Pred-Post: 73 %
FVC-%Pred-Pre: 65 %
FVC-Post: 2.9 L
FVC-Pre: 2.57 L
Post FEV1/FVC ratio: 65 %
Post FEV6/FVC ratio: 99 %
Pre FEV1/FVC ratio: 64 %
Pre FEV6/FVC Ratio: 100 %
RV % pred: 132 %
RV: 3.56 L
TLC % pred: 88 %
TLC: 6.25 L

## 2020-07-17 MED ORDER — STIOLTO RESPIMAT 2.5-2.5 MCG/ACT IN AERS: 2.0000 | INHALATION_SPRAY | Freq: Every day | RESPIRATORY_TRACT | 11 refills | Status: DC

## 2020-07-17 NOTE — Progress Notes (Signed)
Full PFT performed today. °

## 2020-07-17 NOTE — Patient Instructions (Signed)
Full PFT performed today. °

## 2020-07-17 NOTE — Progress Notes (Signed)
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 Pulmonary Consult    Referred by Dr Warrick Parisian. Pt c/o SOB x 5 years, worse x 1 1/5 years. He states he gets winded walking to the mailbox. He is using his ventolin inhaler 4 x per wk on average.   Dyspnea:  Daily 250 ft uphill to mailbox can make it s stopping at slower pace Cough: dry, daytime Sleep: fine on side/ 2 pillows / flat bad SABA use: not really helping, never prechallenges or rechallenges  rec Stop ramapril  Start on ibesartan 20 mg daily and your symptoms should gradually resolve Stop trelegy  Only use your albuterol as a rescue medication to be used if you can't catch your breath Change prilosec Take 30-60 min before first meal of the day    06/03/2020  f/u ov/Trevionne Advani re: UACS/? ACE case  Chief Complaint  Patient presents with  . Follow-up    Breathing is unchanged since the last visit. He has not been exerting himself much at all. He rarely uses the albuterol inhaler. He states he been wheezing more often. He has cough occ with clear sputum.   Dyspnea:  No longer mailbox walking  Cough: assoc with watery rhinitis x years aware of it at hs but blows nose and then ok for rest of noct "allergy much worse when stopped prilosec in past"  Sleeping: bed is flat /on side 2 pillows  SABA use: not using  02: none  Covid status:   X 3  rec  To get the most out of exercise, you need to be continuously aware that you are short  of breath, but never out of breath, for 30 minutes daily.  Ok to Try albuterol 15 min before an activity that you know would make you short of breath and see if it makes any difference and if makes none then don't take it after activity unless you can't catch your breath. Stop prilosec and Pantoprazole (protonix) 40 mg (same 2 prilosec)   Take  30-60 min before first meal of the day and Pepcid (famotidine)  20 mg after supper  until return to office  GERD diet  Prednisone 10 mg take  4 each am x 2 days,   2 each am x 2 days,  1 each am x 2 days and stop  Please schedule a follow up office visit in 6 weeks, call sooner if needed with PFTs   07/17/2020  f/u ov/Vaden Becherer re:  GOLD II copd on prn saba only  Chief Complaint  Patient presents with  . Follow-up    Still getting SOB with activities, occ dry cough  Dyspnea:  Does fine at [employer] leaning on cart which he only does after using pain med o/w more limited by back han breathing Cough: same despite reflux rx and off acei, no change  Sleeping: flat bed / 2 pillows  SABA  use: using before setting up camp/ never tried before a shower  02:  none Covid status:   vax x 3   No obvious day to day or daytime variability or assoc excess/ purulent sputum or mucus plugs or hemoptysis or cp or chest tightness, subjective wheeze or overt sinus or hb symptoms.   Sleeping as bove  without nocturnal  or early am exacerbation  of respiratory  c/o's or need for noct saba. Also denies any obvious fluctuation of symptoms with weather or environmental changes or other aggravating or alleviating factors except as outlined above   No unusual exposure hx or h/o childhood pna/ asthma or knowledge of premature birth.  Current Allergies, Complete Past Medical History, Past Surgical History, Family History, and Social History were reviewed in [employer] record.  ROS  The following are not active complaints unless bolded Hoarseness, sore throat,  dysphagia, dental problems, itching, sneezing,  nasal congestion or discharge of excess mucus or purulent secretions, ear ache,   fever, chills, sweats, unintended wt loss or wt gain, classically pleuritic or exertional cp,  orthopnea pnd or arm/hand swelling  or leg swelling, presyncope, palpitations, abdominal pain, anorexia, nausea, vomiting, diarrhea  or change in bowel habits or change in bladder habits, change in stools or change in urine, dysuria, hematuria,  rash, arthralgias, visual complaints, headache, numbness, weakness or ataxia or problems with walking or coordination,  change in mood or  memory.        Current Meds  Medication Sig  . albuterol (VENTOLIN HFA) 108 (90 Base) MCG/ACT inhaler Inhale 2 puffs into the lungs every 6 (six) hours as needed for wheezing or shortness of breath.  Marland Kitchen amLODipine (NORVASC) 5 MG tablet Take 1 tablet (5 mg total) by mouth daily.  Marland Kitchen aspirin 81 MG tablet Take 81 mg by mouth 2 (two) times daily.   . cetirizine (ZYRTEC) 10 MG tablet Take 1 tablet (10 mg total) by mouth daily.  . famotidine (PEPCID) 20 MG tablet One after supper  . furosemide (LASIX) 20 MG tablet Take 1 tablet (20 mg total) by mouth daily.  . mirabegron ER (MYRBETRIQ) 50 MG TB24 tablet Take 1 tablet (50 mg total) by mouth daily.  . pantoprazole (PROTONIX) 40 MG tablet Take 1 tablet (40 mg total) by mouth daily. Take 30-60 min before first meal of the day  . predniSONE (DELTASONE) 10 MG tablet Take  4 each am x 2 days,   2 each am x 2 days,  1 each am x 2 days and stop  . QUEtiapine (SEROQUEL) 50 MG tablet Take 1 tablet (50 mg total) by mouth at bedtime.  Marland Kitchen REPATHA SURECLICK 884 MG/ML SOAJ INJECT 140 MG INTO THE SKIN EVERY 14 DAYS  . traMADol (ULTRAM) 50 MG tablet Take 1 tablet (50 mg total) by mouth daily as needed.  . valsartan (DIOVAN) 160 MG tablet Take 1 tablet (160 mg total) by mouth 2 (two) times daily.  . Vitamin D, Ergocalciferol, (DRISDOL) 1.25 MG (50000 UNIT) CAPS capsule Take 1  capsule (50,000 Units total) by mouth once a week.           Past Medical History:  Diagnosis Date  . Adenomatous colon polyp 11/1999  . Allergy   . Blood transfusion without reported diagnosis   . Cancer [facility])    Prostate  . Carotid artery occlusion   . Cataract   . COPD (chronic obstructive pulmonary disease) ([place])   . GERD (gastroesophageal reflux disease)   .  Hyperlipidemia   . Hypertension   . Internal hemorrhoids   . PVC (premature ventricular contraction)       Objective:       07/17/2020        192  06/03/20 191 lb 12.8 oz (87 kg)  05/13/20 190 lb (86.2 kg)  04/22/20 189 lb (85.7 kg)    Vital signs reviewed  07/17/2020  - Note at rest 02 sats  98% on RA   General appearance:    amb somber wm nad  HEENT : pt wearing mask not removed for exam due to covid - 19 concerns.   NECK :  without JVD/Nodes/TM/ nl carotid upstrokes bilaterally   LUNGS: no acc muscle use,  Min barrel  contour chest wall with bilateral  slightly decreased bs s audible wheeze and  without cough on insp or exp maneuvers and min  Hyperresonant  to  percussion bilaterally     CV:  RRR  no s3 or murmur or increase in P2, and no edema   ABD:  soft and nontender with pos end  insp Hoover's  in the supine position. No bruits or organomegaly appreciated, bowel sounds nl  MS:   Nl gait/  ext warm without deformities, calf tenderness, cyanosis or clubbing No obvious joint restrictions   SKIN: warm and dry without lesions    NEURO:  alert, approp, nl sensorium with  no motor or cerebellar deficits apparent.                   Assessment

## 2020-07-17 NOTE — Assessment & Plan Note (Signed)
Quit smoking 1998 - PFT's  07/17/2020  FEV1 1.88 (67 % ) ratio 0.65  p 13 % improvement from saba p nothing prior to study with DLCO  17.67 (73%) corrects to 3.88 (99%)  for alv volume and FV curve classic concave pattern  - 07/17/2020  After extensive coaching inhaler device,  effectiveness =    90% with SMI > trial of stiolto 2 puffs each am  - Labs ordered 07/17/2020  :     alpha one AT phenotype  Pt is Group B in terms of symptom/risk and laba/lama therefore appropriate rx at this point >>>  stiolto trial approp plus prn saba  I spent extra time with pt today reviewing appropriate use of albuterol for prn use on exertion with the following points: 1) saba is for relief of sob that does not improve by walking a slower pace or resting but rather if the pt does not improve after trying this first. 2) If the pt is convinced, as many are, that saba helps recover from activity faster then it's easy to tell if this is the case by re-challenging : ie stop, take the inhaler, then p 5 minutes try the exact same activity (intensity of workload) that just caused the symptoms and see if they are substantially diminished or not after saba 3) if there is an activity that reproducibly causes the symptoms, try the saba 15 min before the activity on alternate days   If in fact the saba really does help, then fine to continue to use it prn but advised may need to look closer at the maintenance regimen being used to achieve better control of airways disease with exertion.

## 2020-07-17 NOTE — Patient Instructions (Addendum)
Plan A = Automatic = Always=    Stiolto 2 puffs each am   Work on inhaler technique:  relax and gently blow all the way out then take a nice smooth deep breath back in, triggering the inhaler at same time you start breathing in.  Hold for up to 5 seconds if you can.  Rinse and gargle with water when done     Plan B = Backup (to supplement plan A, not to replace it) Only use your albuterol inhaler as a rescue medication to be used if you can't catch your breath by resting or doing a relaxed purse lip breathing pattern.  - The less you use it, the better it will work when you need it. - Ok to use the inhaler up to 2 puffs  every 4 hours if you must but call for appointment if use goes up over your usual need - Don't leave home without it !!  (think of it like the spare tire for your car)     Ok to Try albuterol 15 min before an activity that you know would make you short of breath and see if it makes any difference and if makes none then don't take it after activity unless you can't catch your breath.   Please remember to go to the lab department   for your tests - we will call you with the results when they are available.     Please schedule a follow up visit in 3 months but call sooner if needed

## 2020-07-17 NOTE — Assessment & Plan Note (Signed)
Onset around 2017 assoc with sob/ never noct - d/c acei 04/22/2020 > no better 07/17/2020  - max gerd rx 06/03/2020 >>> no better 07/17/2020  - Allergy profile 07/17/2020 >  Eos 0. /  IgE  Pending   Not clear what's causing the cough at this point but prefer to  continue gerd rx and off acei until sorted out.         Each maintenance medication was reviewed in detail including emphasizing most importantly the difference between maintenance and prns and under what circumstances the prns are to be triggered using an action plan format where appropriate.  Total time for H and P, chart review, counseling, reviewing smi device(s) and generating customized AVS unique to this office visit / same day charting  > 30 min

## 2020-07-21 NOTE — Progress Notes (Signed)
Spoke with pt and notified of results per Dr. Wert. Pt verbalized understanding and denied any questions. 

## 2020-07-23 ENCOUNTER — Other Ambulatory Visit: Payer: Self-pay | Admitting: *Deleted

## 2020-07-23 MED ORDER — VALSARTAN 160 MG PO TABS: 160.0000 mg | ORAL_TABLET | Freq: Two times a day (BID) | ORAL | 3 refills | Status: DC

## 2020-07-23 NOTE — Telephone Encounter (Signed)
Rx(s) sent to pharmacy electronically.  

## 2020-07-28 ENCOUNTER — Ambulatory Visit (INDEPENDENT_AMBULATORY_CARE_PROVIDER_SITE_OTHER): Payer: Medicare Other

## 2020-07-28 VITALS — Ht 72.0 in | Wt 190.0 lb

## 2020-07-28 DIAGNOSIS — Z0000 Encounter for general adult medical examination without abnormal findings: Secondary | ICD-10-CM

## 2020-07-28 NOTE — Patient Instructions (Signed)
[name] , Thank you for taking time to come for your Medicare Wellness Visit. I appreciate your ongoing commitment to your health goals. Please review the following plan we discussed and let me know if I can assist you in the future.   Screening recommendations/referrals: Colonoscopy: Done 07/05/2017 - Repeat not required Recommended yearly ophthalmology/optometry visit for glaucoma screening and checkup Recommended yearly dental visit for hygiene and checkup  Vaccinations: Influenza vaccine: Done 12/05/2019 - Repeat annually Pneumococcal vaccine: Done 03/05/20 - next due 02/2021 Tdap vaccine: Done 12/05/19 - Repeat every 10 years Shingles vaccine: Shingrix discussed. Please contact your pharmacy for coverage information.    Covid-19: Done 04/01/19, 04/29/19, & 01/15/2020 - due for second booster  Advanced directives: Please bring a copy of your health care power of attorney and living will to the office to be added to your chart at your convenience.  Conditions/risks identified: Aim for 30 minutes of exercise or brisk walking each day, drink 6-8 glasses of water and eat lots of fruits and vegetables.  Next appointment: Follow up in one year for your annual wellness visit.   Preventive Care 90 Years and Older, Male  Preventive care refers to lifestyle choices and visits with your health care provider that can promote health and wellness. What does preventive care include?  A yearly physical exam. This is also called an annual well check.  Dental exams once or twice a year.  Routine eye exams. Ask your health care provider how often you should have your eyes checked.  Personal lifestyle choices, including:  Daily care of your teeth and gums.  Regular physical activity.  Eating a healthy diet.  Avoiding tobacco and drug use.  Limiting alcohol use.  Practicing safe sex.  Taking low doses of aspirin every day.  Taking vitamin and mineral supplements as recommended by your  health care provider. What happens during an annual well check? The services and screenings done by your health care provider during your annual well check will depend on your age, overall health, lifestyle risk factors, and family history of disease. Counseling  Your health care provider may ask you questions about your:  Alcohol use.  Tobacco use.  Drug use.  Emotional well-being.  Home and relationship well-being.  Sexual activity.  Eating habits.  History of falls.  Memory and ability to understand (cognition).  Work and work Statistician. Screening  You may have the following tests or measurements:  Height, weight, and BMI.  Blood pressure.  Lipid and cholesterol levels. These may be checked every 5 years, or more frequently if you are over 58 years old.  Skin check.  Lung cancer screening. You may have this screening every year starting at age 30 if you have a 30-pack-year history of smoking and currently smoke or have quit within the past 15 years.  Fecal occult blood test (FOBT) of the stool. You may have this test every year starting at age 34.  Flexible sigmoidoscopy or colonoscopy. You may have a sigmoidoscopy every 5 years or a colonoscopy every 10 years starting at age 38.  Prostate cancer screening. Recommendations will vary depending on your family history and other risks.  Hepatitis C blood test.  Hepatitis B blood test.  Sexually transmitted disease (STD) testing.  Diabetes screening. This is done by checking your blood sugar (glucose) after you have not eaten for a while (fasting). You may have this done every 1-3 years.  Abdominal aortic aneurysm (AAA) screening. You may need this if you  are a current or former smoker.  Osteoporosis. You may be screened starting at age 59 if you are at high risk. Talk with your health care provider about your test results, treatment options, and if necessary, the need for more tests. Vaccines  Your health  care provider may recommend certain vaccines, such as:  Influenza vaccine. This is recommended every year.  Tetanus, diphtheria, and acellular pertussis (Tdap, Td) vaccine. You may need a Td booster every 10 years.  Zoster vaccine. You may need this after age 8.  Pneumococcal 13-valent conjugate (PCV13) vaccine. One dose is recommended after age 50.  Pneumococcal polysaccharide (PPSV23) vaccine. One dose is recommended after age 66. Talk to your health care provider about which screenings and vaccines you need and how often you need them. This information is not intended to replace advice given to you by your health care provider. Make sure you discuss any questions you have with your health care provider. Document Released: 04/03/2015 Document Revised: 11/25/2015 Document Reviewed: 01/06/2015 Elsevier Interactive Patient Education  2017 [employer] Prevention in the Home Falls can cause injuries. They can happen to people of all ages. There are many things you can do to make your home safe and to help prevent falls. What can I do on the outside of my home?  Regularly fix the edges of walkways and driveways and fix any cracks.  Remove anything that might make you trip as you walk through a door, such as a raised step or threshold.  Trim any bushes or trees on the path to your home.  Use bright outdoor lighting.  Clear any walking paths of anything that might make someone trip, such as rocks or tools.  Regularly check to see if handrails are loose or broken. Make sure that both sides of any steps have handrails.  Any raised decks and porches should have guardrails on the edges.  Have any leaves, snow, or ice cleared regularly.  Use sand or salt on walking paths during winter.  Clean up any spills in your garage right away. This includes oil or grease spills. What can I do in the bathroom?  Use night lights.  Install grab bars by the toilet and in the tub and  shower. Do not use towel bars as grab bars.  Use non-skid mats or decals in the tub or shower.  If you need to sit down in the shower, use a plastic, non-slip stool.  Keep the floor dry. Clean up any water that spills on the floor as soon as it happens.  Remove soap buildup in the tub or shower regularly.  Attach bath mats securely with double-sided non-slip rug tape.  Do not have throw rugs and other things on the floor that can make you trip. What can I do in the bedroom?  Use night lights.  Make sure that you have a light by your bed that is easy to reach.  Do not use any sheets or blankets that are too big for your bed. They should not hang down onto the floor.  Have a firm chair that has side arms. You can use this for support while you get dressed.  Do not have throw rugs and other things on the floor that can make you trip. What can I do in the kitchen?  Clean up any spills right away.  Avoid walking on wet floors.  Keep items that you use a lot in easy-to-reach places.  If you need to reach  something above you, use a strong step stool that has a grab bar.  Keep electrical cords out of the way.  Do not use floor polish or wax that makes floors slippery. If you must use wax, use non-skid floor wax.  Do not have throw rugs and other things on the floor that can make you trip. What can I do with my stairs?  Do not leave any items on the stairs.  Make sure that there are handrails on both sides of the stairs and use them. Fix handrails that are broken or loose. Make sure that handrails are as long as the stairways.  Check any carpeting to make sure that it is firmly attached to the stairs. Fix any carpet that is loose or worn.  Avoid having throw rugs at the top or bottom of the stairs. If you do have throw rugs, attach them to the floor with carpet tape.  Make sure that you have a light switch at the top of the stairs and the bottom of the stairs. If you do not  have them, ask someone to add them for you. What else can I do to help prevent falls?  Wear shoes that:  Do not have high heels.  Have rubber bottoms.  Are comfortable and fit you well.  Are closed at the toe. Do not wear sandals.  If you use a stepladder:  Make sure that it is fully opened. Do not climb a closed stepladder.  Make sure that both sides of the stepladder are locked into place.  Ask someone to hold it for you, if possible.  Clearly mark and make sure that you can see:  Any grab bars or handrails.  First and last steps.  Where the edge of each step is.  Use tools that help you move around (mobility aids) if they are needed. These include:  Canes.  Walkers.  Scooters.  Crutches.  Turn on the lights when you go into a dark area. Replace any light bulbs as soon as they burn out.  Set up your furniture so you have a clear path. Avoid moving your furniture around.  If any of your floors are uneven, fix them.  If there are any pets around you, be aware of where they are.  Review your medicines with your doctor. Some medicines can make you feel dizzy. This can increase your chance of falling. Ask your doctor what other things that you can do to help prevent falls. This information is not intended to replace advice given to you by your health care provider. Make sure you discuss any questions you have with your health care provider. Document Released: 01/01/2009 Document Revised: 08/13/2015 Document Reviewed: 04/11/2014 Elsevier Interactive Patient Education  2017 [employer].

## 2020-07-28 NOTE — Progress Notes (Signed)
Subjective:   [name] is a 85 y.o. male who presents for Medicare Annual/Subsequent preventive examination.  Virtual Visit via Telephone Note  I connected with  [name] on 07/28/20 at  2:00 PM EDT by telephone and verified that I am speaking with the correct person using two identifiers.  Location: Patient: Home Provider: [facility] Persons participating in the virtual visit: patient/Nurse Health Advisor   I discussed the limitations, risks, security and privacy concerns of performing an evaluation and management service by telephone and the availability of in person appointments. The patient expressed understanding and agreed to proceed.  Interactive audio and video telecommunications were attempted between this nurse and patient, however failed, due to patient having technical difficulties OR patient did not have access to video capability.  We continued and completed visit with audio only.  Some vital signs may be absent or patient reported.   Lashan Gluth E Richard Ritchey, LPN   Review of Systems     Cardiac Risk Factors include: advanced age (>62men, >76 women);male gender;hypertension;sedentary lifestyle;dyslipidemia     Objective:    Today's Vitals   07/28/20 1339  Weight: 190 lb (86.2 kg)  Height: 6' (1.829 m)  PainSc: 5    Body mass index is 25.77 kg/m.  Advanced Directives 05/29/2019 07/05/2017 04/26/2017 01/24/2017 02/20/2015 02/06/2014  Does Patient Have a Medical Advance Directive? Yes No Yes Yes No No  Type of Advance Directive Living will;Out of facility DNR (pink MOST or yellow form) - - Press photographer;Living will - -  Does patient want to make changes to medical advance directive? No - Patient declined - - No - Patient declined - -  Copy of [place] in Chart? - - - No - copy requested - -  Would patient like information on creating a medical advance directive? - - - - - Yes - Educational materials given    Current Medications  (verified) Outpatient Encounter Medications as of 07/28/2020  Medication Sig  . albuterol (VENTOLIN HFA) 108 (90 Base) MCG/ACT inhaler Inhale 2 puffs into the lungs every 6 (six) hours as needed for wheezing or shortness of breath.  Marland Kitchen aspirin 81 MG tablet Take 81 mg by mouth 2 (two) times daily.   . cetirizine (ZYRTEC) 10 MG tablet Take 1 tablet (10 mg total) by mouth daily.  . furosemide (LASIX) 20 MG tablet Take 1 tablet (20 mg total) by mouth daily.  . mirabegron ER (MYRBETRIQ) 50 MG TB24 tablet Take 1 tablet (50 mg total) by mouth daily.  . pantoprazole (PROTONIX) 40 MG tablet Take 1 tablet (40 mg total) by mouth daily. Take 30-60 min before first meal of the day  . QUEtiapine (SEROQUEL) 50 MG tablet Take 1 tablet (50 mg total) by mouth at bedtime.  Marland Kitchen REPATHA SURECLICK 956 MG/ML SOAJ INJECT 140 MG INTO THE SKIN EVERY 14 DAYS  . Tiotropium Bromide-Olodaterol (STIOLTO RESPIMAT) 2.5-2.5 MCG/ACT AERS Inhale 2 puffs into the lungs daily.  . valsartan (DIOVAN) 160 MG tablet Take 1 tablet (160 mg total) by mouth 2 (two) times daily.  . Vitamin D, Ergocalciferol, (DRISDOL) 1.25 MG (50000 UNIT) CAPS capsule Take 1 capsule (50,000 Units total) by mouth once a week.  Marland Kitchen amLODipine (NORVASC) 5 MG tablet Take 1 tablet (5 mg total) by mouth daily. (Patient not taking: Reported on 07/28/2020)  . famotidine (PEPCID) 20 MG tablet One after supper (Patient not taking: Reported on 07/28/2020)  . predniSONE (DELTASONE) 10 MG tablet Take  4 each am  x 2 days,   2 each am x 2 days,  1 each am x 2 days and stop (Patient not taking: Reported on 07/28/2020)  . traMADol (ULTRAM) 50 MG tablet Take 1 tablet (50 mg total) by mouth daily as needed. (Patient not taking: Reported on 07/28/2020)   No facility-administered encounter medications on file as of 07/28/2020.    Allergies (verified) Lamisil [terbinafine hcl]   History: Past Medical History:  Diagnosis Date  . Adenomatous colon polyp 11/1999  . Allergy   . Blood  transfusion without reported diagnosis   . Cancer [facility])    Prostate  . Carotid artery occlusion   . Cataract   . COPD (chronic obstructive pulmonary disease) ([place])   . GERD (gastroesophageal reflux disease)   . Hyperlipidemia   . Hypertension   . Internal hemorrhoids   . PVC (premature ventricular contraction)    Past Surgical History:  Procedure Laterality Date  . APPENDECTOMY    . carotid surgery    . CATARACT EXTRACTION     both eyes  . COLONOSCOPY    . POLYPECTOMY    . TRANSURETHRAL RESECTION OF PROSTATE    . UPPER GASTROINTESTINAL ENDOSCOPY    . UPPER GI ENDOSCOPY  Nov. 3, 2014   Upper endoscopy with diatation   Family History  Problem Relation Age of Onset  . Colon cancer Mother   . Cancer Mother        colon  . Heart disease Father        Heart Disease before age 13  . Heart attack Father 48  . Arthritis Sister   . Esophageal cancer Neg Hx   . Rectal cancer Neg Hx   . Stomach cancer Neg Hx   . Colon polyps Neg Hx    Social History   Socioeconomic History  . Marital status: Widowed    Spouse name: Not on file  . Number of children: 2  . Years of education: some college  . Highest education level: 12th grade  Occupational History  . Not on file  Tobacco Use  . Smoking status: Former Smoker    Packs/day: 1.50    Years: 40.00    Pack years: 60.00    Types: Cigarettes    Quit date: 01/19/1997    Years since quitting: 23.5  . Smokeless tobacco: Never Used  Vaping Use  . Vaping Use: Never used  Substance and Sexual Activity  . Alcohol use: No  . Drug use: No  . Sexual activity: Not on file  Other Topics Concern  . Not on file  Social History Narrative   Widowed. Lives alone.    Social Determinants of Health   Financial Resource Strain: Low Risk   . Difficulty of Paying Living Expenses: Not hard at all  Food Insecurity: No Food Insecurity  . Worried About Charity fundraiser in the Last Year: Never true  . Ran Out of Food in the Last Year:  Never true  Transportation Needs: No Transportation Needs  . Lack of Transportation (Medical): No  . Lack of Transportation (Non-Medical): No  Physical Activity: Inactive  . Days of Exercise per Week: 0 days  . Minutes of Exercise per Session: 0 min  Stress: No Stress Concern Present  . Feeling of Stress : Not at all  Social Connections: Moderately Integrated  . Frequency of Communication with Friends and Family: More than three times a week  . Frequency of Social Gatherings with Friends and Family: More than  three times a week  . Attends Religious Services: More than 4 times per year  . Active Member of Clubs or Organizations: Yes  . Attends Archivist Meetings: More than 4 times per year  . Marital Status: Widowed    Tobacco Counseling Counseling given: Not Answered   Clinical Intake:  Pre-visit preparation completed: Yes  Pain : 0-10 Pain Score: 5  Pain Type: Chronic pain Pain Location: Leg Pain Orientation: Right,Left Pain Descriptors / Indicators: Aching Pain Onset: More than a month ago Pain Frequency: Intermittent     BMI - recorded: 25.77 Nutritional Status: BMI 25 -29 Overweight Nutritional Risks: None Diabetes: No  How often do you need to have someone help you when you read instructions, pamphlets, or other written materials from your doctor or pharmacy?: 1 - Never  Diabetic? No  Interpreter Needed?: No  Information entered by :: Harith Mccadden, LPN   Activities of Daily Living In your present state of health, do you have any difficulty performing the following activities: 07/28/2020  Hearing? Y  Vision? N  Difficulty concentrating or making decisions? N  Walking or climbing stairs? N  Dressing or bathing? N  Doing errands, shopping? N  Preparing Food and eating ? N  Using the Toilet? N  In the past six months, have you accidently leaked urine? N  Do you have problems with loss of bowel control? N  Managing your Medications? N  Managing  your Finances? N  Housekeeping or managing your Housekeeping? N  Some recent data might be hidden    Patient Care Team: Dettinger, Fransisca Kaufmann, MD as PCP - General (Family Medicine) Minus Breeding, MD as Consulting Physician (Cardiology) Tanda Rockers, MD as Consulting Physician (Pulmonary Disease) Allyn Kenner, MD (Dermatology)  Indicate any recent Medical Services you may have received from other than [facility] providers in the past year (date may be approximate).     Assessment:   This is a routine wellness examination for Joziyah.  Hearing/Vision screen  Hearing Screening   125Hz  250Hz  500Hz  1000Hz  2000Hz  3000Hz  4000Hz  6000Hz  8000Hz   Right ear:           Left ear:           Comments: Wears hearing aids - got hearing aids in [place] - sees them every 3 months  Vision Screening Comments: Sees Dr Gershon Crane - past due for eye exam - wears reading glasses  Dietary issues and exercise activities discussed: Current Exercise Habits: The patient does not participate in regular exercise at present, Exercise limited by: orthopedic condition(s)  Goals Addressed            This Visit's Progress   . Patient Stated       Wants to take his grandchildren on camping trips Increase fruits and vegetables Increase water intake Increase Protein      Depression Screen PHQ 2/9 Scores 07/28/2020 06/08/2020 03/05/2020 12/05/2019 05/29/2019 02/04/2019 09/07/2018  PHQ - 2 Score 0 0 0 0 0 0 0    Fall Risk Fall Risk  06/08/2020 03/05/2020 12/05/2019 05/29/2019 02/04/2019  Falls in the past year? 0 0 0 0 0    FALL RISK PREVENTION PERTAINING TO THE HOME:  Any stairs in or around the home? No  If so, are there any without handrails? No  Home free of loose throw rugs in walkways, pet beds, electrical cords, etc? Yes  Adequate lighting in your home to reduce risk of falls? Yes   ASSISTIVE DEVICES UTILIZED TO PREVENT  FALLS:  Life alert? No  Use of a cane, walker or w/c? No  Grab bars in the bathroom?  Yes  Shower chair or bench in shower? No  Elevated toilet seat or a handicapped toilet? No   TIMED UP AND GO:  Was the test performed? No . Telephonic visit.  Cognitive Function: Normal cognitive status assessed by direct observation by this Nurse Health Advisor. No abnormalities found.   MMSE - Mini Mental State Exam 01/24/2017  Orientation to time 5  Orientation to Place 5  Registration 3  Attention/ Calculation 5  Recall 2  Language- name 2 objects 2  Language- repeat 1  Language- follow 3 step command 3  Language- read & follow direction 1  Write a sentence 1  Copy design 1  Total score 29     6CIT Screen 05/29/2019  What Year? 0 points  What month? 0 points  What time? 0 points  Count back from 20 0 points  Months in reverse 0 points  Repeat phrase 0 points  Total Score 0    Immunizations Immunization History  Administered Date(s) Administered  . Fluad Quad(high Dose 65+) 01/17/2019, 12/05/2019  . Influenza Inj Mdck Quad Pf 01/17/2019  . Influenza Split 01/06/2004, 12/23/2004, 12/29/2005  . Influenza, High Dose Seasonal PF 01/10/2017, 01/10/2018  . Influenza,inj,Quad PF,6+ Mos 12/17/2015  . Influenza-Unspecified 01/10/2017, 01/10/2018, 01/17/2019  . Moderna Sars-Covid-2 Vaccination 04/01/2019, 04/29/2019, 01/15/2020  . Pneumococcal Conjugate-13 03/05/2020  . Tdap 12/05/2019    TDAP status: Up to date  Flu Vaccine status: Up to date  Pneumococcal vaccine status: Up to date  Covid-19 vaccine status: Completed vaccines  Qualifies for Shingles Vaccine? Yes   Zostavax completed Yes   Shingrix Completed?: No.    Education has been provided regarding the importance of this vaccine. Patient has been advised to call insurance company to determine out of pocket expense if they have not yet received this vaccine. Advised may also receive vaccine at local pharmacy or Health Dept. Verbalized acceptance and understanding.  Screening Tests Health Maintenance  Topic  Date Due  . INFLUENZA VACCINE  10/19/2020  . PNA vac Low Risk Adult (2 of 2 - PPSV23) 03/05/2021  . TETANUS/TDAP  12/04/2029  . COVID-19 Vaccine  Completed  . HPV VACCINES  Aged Out    Health Maintenance  There are no preventive care reminders to display for this patient.  Colorectal cancer screening: No longer required.   Lung Cancer Screening: (Low Dose CT Chest recommended if Age 62-80 years, 30 pack-year currently smoking OR have quit w/in 15years.) does not qualify.   Additional Screening:  Hepatitis C Screening: does not qualify  Vision Screening: Recommended annual ophthalmology exams for early detection of glaucoma and other disorders of the eye. Is the patient up to date with their annual eye exam?  No  Who is the provider or what is the name of the office in which the patient attends annual eye exams? [facility] If pt is not established with a provider, would they like to be referred to a provider to establish care? No .   Dental Screening: Recommended annual dental exams for proper oral hygiene  Community Resource Referral / Chronic Care Management: CRR required this visit?  No   CCM required this visit?  No      Plan:     I have personally reviewed and noted the following in the patient's chart:   . Medical and social history . Use of alcohol, tobacco or illicit  drugs  . Current medications and supplements including opioid prescriptions. Patient is not currently taking opioid prescriptions. . Functional ability and status . Nutritional status . Physical activity . Advanced directives . List of other physicians . Hospitalizations, surgeries, and ER visits in previous 12 months . Vitals . Screenings to include cognitive, depression, and falls . Referrals and appointments  In addition, I have reviewed and discussed with patient certain preventive protocols, quality metrics, and best practice recommendations. A written personalized care plan for preventive  services as well as general preventive health recommendations were provided to patient.     Sandrea Hammond, LPN   9/38/1829   Nurse Notes: None

## 2020-07-30 ENCOUNTER — Other Ambulatory Visit: Payer: Self-pay | Admitting: Family Medicine

## 2020-08-01 LAB — ALPHA-1 ANTITRYPSIN PHENOTYPE: A-1 Antitrypsin, Ser: 127 mg/dL (ref 83–199)

## 2020-08-01 LAB — IGE: IgE (Immunoglobulin E), Serum: 120 kU/L — ABNORMAL HIGH (ref <=114)

## 2020-08-04 ENCOUNTER — Telehealth: Payer: Self-pay | Admitting: Internal Medicine

## 2020-08-04 MED ORDER — STIOLTO RESPIMAT 2.5-2.5 MCG/ACT IN AERS
2.0000 | INHALATION_SPRAY | Freq: Every day | RESPIRATORY_TRACT | 3 refills | Status: DC
Start: 2020-08-04 — End: 2020-11-04

## 2020-08-04 MED ORDER — FAMOTIDINE 20 MG PO TABS: ORAL_TABLET | ORAL | 1 refills | Status: DC

## 2020-08-04 NOTE — Telephone Encounter (Signed)
rx for pt's famotidine and [facility]iol[facility] Nothing further needed.

## 2020-08-11 NOTE — Progress Notes (Signed)
Cardiology Office Note   Date:  08/12/2020   ID:  [name], DOB 1935/10/06, MRN [id number]  PCP:  Dettinger, Fransisca Kaufmann, MD  Cardiologist:   Minus Breeding, MD    Chief Complaint  Patient presents with  . Shortness of Breath     History of Present Illness: [name] is a 85 y.o. male who presents for evaluation of dyslipidemia and bradycardia.    He's intolerant of all statins. He gets diffuse muscle aches. He does have peripheral vascular disease.  He previously saw Dr. Mare Ferrari once in 2015 for bradycardia.  He had NSR with PVCs/bigeminy.  He apparently did no perfuse the PVCs.  He has had CEA in the past.  He's had negative stress testing some years ago.  He did have an essentially normal echo in 2017.  At a previous visit he had SOB and he had negative Lexiscan Myoview.     He was put on a new breathing treatment (Stiolto) and he thinks he had some improvement.  He is a little less breathless.  He has lost motivation since his wife died and has not wanted to do a lot of physical continue a lot of physical activity.  He does like to go camping with some great grandson who is active with his church.  He denies any new cardiovascular symptoms such as chest pressure, neck or arm discomfort.  He does not feel of his premature ectopic complexes.  Past Medical History:  Diagnosis Date  . Adenomatous colon polyp 11/1999  . Allergy   . Blood transfusion without reported diagnosis   . Cancer [facility])    Prostate  . Carotid artery occlusion   . Cataract   . COPD (chronic obstructive pulmonary disease) ()   . GERD (gastroesophageal reflux disease)   . Hyperlipidemia   . Hypertension   . Internal hemorrhoids   . PVC (premature ventricular contraction)     Past Surgical History:  Procedure Laterality Date  . APPENDECTOMY    . carotid surgery    . CATARACT EXTRACTION     both eyes  . COLONOSCOPY    . POLYPECTOMY    . TRANSURETHRAL RESECTION OF PROSTATE    . UPPER  GASTROINTESTINAL ENDOSCOPY    . UPPER GI ENDOSCOPY  Nov. 3, 2014   Upper endoscopy with diatation     Current Outpatient Medications  Medication Sig Dispense Refill  . amLODipine (NORVASC) 5 MG tablet Take 1 tablet (5 mg total) by mouth daily. 90 tablet 1  . aspirin 81 MG tablet Take 81 mg by mouth 2 (two) times daily.     . cetirizine (ZYRTEC) 10 MG tablet Take 1 tablet (10 mg total) by mouth daily. 90 tablet 3  . famotidine (PEPCID) 20 MG tablet One after supper 90 tablet 1  . furosemide (LASIX) 20 MG tablet Take 1 tablet (20 mg total) by mouth daily. 90 tablet 3  . mirabegron ER (MYRBETRIQ) 50 MG TB24 tablet Take 1 tablet (50 mg total) by mouth daily. 90 tablet 3  . pantoprazole (PROTONIX) 40 MG tablet Take 1 tablet (40 mg total) by mouth daily. Take 30-60 min before first meal of the day 30 tablet 2  . QUEtiapine (SEROQUEL) 50 MG tablet Take 1 tablet (50 mg total) by mouth at bedtime. 90 tablet 3  . REPATHA SURECLICK 428 MG/ML SOAJ INJECT 140 MG INTO THE SKIN EVERY 14 DAYS 6 mL 3  . Tiotropium Bromide-Olodaterol (STIOLTO RESPIMAT) 2.5-2.5 MCG/ACT AERS Inhale 2  puffs into the lungs daily. 12 g 3  . valsartan (DIOVAN) 160 MG tablet Take 1 tablet (160 mg total) by mouth 2 (two) times daily. 180 tablet 3  . Vitamin D, Ergocalciferol, (DRISDOL) 1.25 MG (50000 UNIT) CAPS capsule Take 1 capsule (50,000 Units total) by mouth once a week. 12 capsule 3   No current facility-administered medications for this visit.    Allergies:   Lamisil [terbinafine hcl]    ROS:  Please see the history of present illness.   Otherwise, review of systems are positive for none.   All other systems are reviewed and negative.    PHYSICAL EXAM: VS:  BP 132/76   Pulse 67   Ht 5\' 11"  (1.803 m)   Wt 192 lb (87.1 kg)   SpO2 96%   BMI 26.78 kg/m  , BMI Body mass index is 26.78 kg/m.  GENERAL:  Well appearing NECK:  No jugular venous distention, waveform within normal limits, carotid upstroke brisk and  symmetric, no bruits, no thyromegaly LUNGS:  Clear to auscultation bilaterally CHEST:  Unremarkable HEART:  PMI not displaced or sustained,S1 and S2 within normal limits, no S3, no S4, no clicks, no rubs, no murmurs ABD:  Flat, positive bowel sounds normal in frequency in pitch, no bruits, no rebound, no guarding, no midline pulsatile mass, no hepatomegaly, no splenomegaly EXT:  2 plus pulses throughout, no edema, no cyanosis no clubbing   EKG:  EKG is  not ordered today.   Recent Labs: 06/08/2020: ALT 32; BUN 17; Creatinine, Ser 1.06; Potassium 3.5; Sodium 141 07/17/2020: Hemoglobin 13.0; Platelets 168.0    Lipid Panel    Component Value Date/Time   CHOL 137 06/08/2020 0921   TRIG 187 (H) 06/08/2020 0921   HDL 44 06/08/2020 0921   CHOLHDL 3.1 06/08/2020 0921   LDLCALC 62 06/08/2020 0921      Wt Readings from Last 3 Encounters:  08/12/20 192 lb (87.1 kg)  07/28/20 190 lb (86.2 kg)  07/17/20 192 lb (87.1 kg)      Other studies Reviewed: Additional studies/ records that were reviewed today include: Pulmonary Office Records Review of the above records demonstrates: See elsewhere   ASSESSMENT AND PLAN:  SOB:     This is likely related to COPD.  No further cardiovascular testing.   CAROTID STENOSIS: This is followed by [facility] he does not need for follow-up for another couple of years.  DYSLIPIDEMIA:    He had an excellent lipid profile with LDL of 62.  No change in therapy.  HTN:  The blood pressure is better as he was started on amlodipine since I last saw him.  I had increased his valsartan to twice daily.  No change in therapy and he will continue to follow this.   DEPRESSION: He clearly has some element of this white time and I have asked him to schedule an appointment just to talk about this specific issue with Dettinger, Fransisca Kaufmann, MD   PVCs: These are not bothering him no change in therapy.  Current medicines are reviewed at length with the patient today.  The patient  does not have concerns regarding medicines.  The following changes have been made:  None  Labs/ tests ordered today include: None  No orders of the defined types were placed in this encounter.    Disposition:   Follow-up with me in 12 months  Signed, Minus Breeding, MD  08/12/2020 3:24 PM    [facility]

## 2020-08-12 ENCOUNTER — Other Ambulatory Visit: Payer: Self-pay

## 2020-08-12 ENCOUNTER — Ambulatory Visit (INDEPENDENT_AMBULATORY_CARE_PROVIDER_SITE_OTHER): Payer: Medicare Other | Admitting: Cardiology

## 2020-08-12 ENCOUNTER — Encounter: Payer: Self-pay | Admitting: Cardiology

## 2020-08-12 VITALS — BP 132/76 | HR 67 | Ht 71.0 in | Wt 192.0 lb

## 2020-08-12 DIAGNOSIS — E785 Hyperlipidemia, unspecified: Secondary | ICD-10-CM

## 2020-08-12 DIAGNOSIS — I10 Essential (primary) hypertension: Secondary | ICD-10-CM | POA: Diagnosis not present

## 2020-08-12 DIAGNOSIS — R0602 Shortness of breath: Secondary | ICD-10-CM | POA: Diagnosis not present

## 2020-08-12 DIAGNOSIS — I493 Ventricular premature depolarization: Secondary | ICD-10-CM

## 2020-08-12 NOTE — Patient Instructions (Signed)
Medication Instructions:  The current medical regimen is effective;  continue present plan and medications.  *If you need a refill on your cardiac medications before your next appointment, please call your pharmacy*  Follow-Up: At [facility], you and your health needs are our priority.  As part of our continuing mission to provide you with exceptional heart care, we have created designated Provider Care Teams.  These Care Teams include your primary Cardiologist (physician) and Advanced Practice Providers (APPs -  Physician Assistants and Nurse Practitioners) who all work together to provide you with the care you need, when you need it.  We recommend signing up for the patient portal called "MyChart".  Sign up information is provided on this After Visit Summary.  MyChart is used to connect with patients for Virtual Visits (Telemedicine).  Patients are able to view lab/test results, encounter notes, upcoming appointments, etc.  Non-urgent messages can be sent to your provider as well.   To learn more about what you can do with MyChart, go to [email or web address].    Your next appointment:   12 month(s)  The format for your next appointment:   In Person  Provider:   James Hochrein, MD   Thank you for choosing [facility]!!     

## 2020-09-01 ENCOUNTER — Other Ambulatory Visit: Payer: Self-pay | Admitting: Internal Medicine

## 2020-09-08 ENCOUNTER — Other Ambulatory Visit: Payer: Self-pay

## 2020-09-08 ENCOUNTER — Ambulatory Visit (INDEPENDENT_AMBULATORY_CARE_PROVIDER_SITE_OTHER): Payer: Medicare Other | Admitting: Family Medicine

## 2020-09-08 ENCOUNTER — Encounter: Payer: Self-pay | Admitting: Family Medicine

## 2020-09-08 VITALS — BP 150/70 | HR 88 | Ht 71.0 in | Wt 191.0 lb

## 2020-09-08 DIAGNOSIS — N3281 Overactive bladder: Secondary | ICD-10-CM | POA: Diagnosis not present

## 2020-09-08 DIAGNOSIS — F32A Depression, unspecified: Secondary | ICD-10-CM

## 2020-09-08 DIAGNOSIS — E785 Hyperlipidemia, unspecified: Secondary | ICD-10-CM

## 2020-09-08 DIAGNOSIS — I10 Essential (primary) hypertension: Secondary | ICD-10-CM

## 2020-09-08 DIAGNOSIS — F419 Anxiety disorder, unspecified: Secondary | ICD-10-CM | POA: Diagnosis not present

## 2020-09-08 DIAGNOSIS — J449 Chronic obstructive pulmonary disease, unspecified: Secondary | ICD-10-CM

## 2020-09-08 LAB — BMP8+EGFR
BUN/Creatinine Ratio: 7 — ABNORMAL LOW (ref 10–24)
BUN: 8 mg/dL (ref 8–27)
CO2: 21 mmol/L (ref 20–29)
Calcium: 10.1 mg/dL (ref 8.6–10.2)
Chloride: 102 mmol/L (ref 96–106)
Creatinine, Ser: 1.14 mg/dL (ref 0.76–1.27)
Glucose: 128 mg/dL — ABNORMAL HIGH (ref 65–99)
Potassium: 3.8 mmol/L (ref 3.5–5.2)
Sodium: 139 mmol/L (ref 134–144)
eGFR: 64 mL/min/{1.73_m2} (ref >59)

## 2020-09-08 MED ORDER — AMLODIPINE BESYLATE 5 MG PO TABS: 7.5000 mg | ORAL_TABLET | Freq: Every day | ORAL | 3 refills | Status: DC

## 2020-09-08 NOTE — Progress Notes (Signed)
BP (!) 150/70   Pulse 88   Ht _0  (1.803 m)   Wt 191 lb (86.6 kg)   SpO2 94%   BMI 26.64 kg/m    Subjective:   Patient ID: [name], male    DOB: 16-Jan-1936, 85 y.o.   MRN: [id number]  HPI: Angelica Wix is a 85 y.o. male presenting on 09/08/2020 for Medical Management of Chronic Issues, Hypertension, and Hyperlipidemia   HPI Hypertension Patient is currently on furosemide and amlodipine and valsartan, and their blood pressure today is 150/70. Patient denies any lightheadedness or dizziness. Patient denies headaches, blurred vision, chest pains, shortness of breath, or weakness. Denies any side effects from medication and is content with current medication.   Hyperlipidemia Patient is coming in for recheck of his hyperlipidemia. The patient is currently taking repatha. They deny any issues with myalgias or history of liver damage from it. They deny any focal numbness or weakness or chest pain.  Depression screen [facility] 2/9 09/08/2020 07/28/2020 06/08/2020 03/05/2020 12/05/2019  Decreased Interest 1 0 0 0 0  Down, Depressed, Hopeless 1 0 0 0 0  PHQ - 2 Score 2 0 0 0 0  Altered sleeping 1 - - - -  Tired, decreased energy 1 - - - -  Change in appetite 0 - - - -  Feeling bad or failure about yourself  0 - - - -  Trouble concentrating 0 - - - -  Moving slowly or fidgety/restless 0 - - - -  Suicidal thoughts 0 - - - -  PHQ-9 Score 4 - - - -    COPD Patient is coming in for COPD recheck today.  He is currently on Stiolto.  He has a mild chronic cough but denies any major coughing spells or wheezing spells.  He has 0nighttime symptoms per week and 0daytime symptoms per week currently.   OAB Patient not currently on Myrbetriq feels like is doing well  Anxiety and depression Patient is coming in for anxiety and depression.  He has been feeling down since his wife passed almost 2 years ago but it has progressively worsened and now is the point where he wants help.  He denies any  suicidal ideations or thoughts of hurting self.  He does feel like he has no motivation and does not want to do anything and his appetite is down.  Relevant past medical, surgical, family and social history reviewed and updated as indicated. Interim medical history since our last visit reviewed. Allergies and medications reviewed and updated.  Review of Systems  Constitutional:  Negative for chills and fever.  Respiratory:  Negative for shortness of breath and wheezing.   Cardiovascular:  Negative for chest pain and leg swelling.  Musculoskeletal:  Negative for back pain and gait problem.  Skin:  Negative for rash.  Psychiatric/Behavioral:  Positive for dysphoric mood. Negative for self-injury, sleep disturbance and suicidal ideas. The patient is nervous/anxious.   All other systems reviewed and are negative.  Per HPI unless specifically indicated above   Allergies as of 09/08/2020       Reactions   Lamisil [terbinafine Hcl] Other (See Comments)   Loss of taste         Medication List        Accurate as of September 08, 2020  8:06 AM. If you have any questions, ask your nurse or doctor.          amLODipine 5 MG tablet Commonly known as: NORVASC Take  1 tablet (5 mg total) by mouth daily.   aspirin 81 MG tablet Take 81 mg by mouth 2 (two) times daily.   cetirizine 10 MG tablet Commonly known as: ZYRTEC Take 1 tablet (10 mg total) by mouth daily.   famotidine 20 MG tablet Commonly known as: Pepcid One after supper   furosemide 20 MG tablet Commonly known as: LASIX Take 1 tablet (20 mg total) by mouth daily.   mirabegron ER 50 MG Tb24 tablet Commonly known as: Myrbetriq Take 1 tablet (50 mg total) by mouth daily.   pantoprazole 40 MG tablet Commonly known as: PROTONIX TAKE ONE TABLET BY MOUTH EVERY DAY 30-60 MINUTES BEFORE FIRST MEAL OF THE DAY.   QUEtiapine 50 MG tablet Commonly known as: SEROQUEL Take 1 tablet (50 mg total) by mouth at bedtime.   Repatha  SureClick 975 MG/ML Soaj Generic drug: Evolocumab INJECT 140 MG INTO THE SKIN EVERY 14 DAYS   Stiolto Respimat 2.5-2.5 MCG/ACT Aers Generic drug: Tiotropium Bromide-Olodaterol Inhale 2 puffs into the lungs daily.   valsartan 160 MG tablet Commonly known as: Diovan Take 1 tablet (160 mg total) by mouth 2 (two) times daily.   Vitamin D (Ergocalciferol) 1.25 MG (50000 UNIT) Caps capsule Commonly known as: DRISDOL Take 1 capsule (50,000 Units total) by mouth once a week.         Objective:   BP (!) 150/70   Pulse 88   Ht _0  (1.803 m)   Wt 191 lb (86.6 kg)   SpO2 94%   BMI 26.64 kg/m   Wt Readings from Last 3 Encounters:  09/08/20 191 lb (86.6 kg)  08/12/20 192 lb (87.1 kg)  07/28/20 190 lb (86.2 kg)    Physical Exam Vitals and nursing note reviewed.  Constitutional:      General: He is not in acute distress.    Appearance: He is well-developed. He is not diaphoretic.  Eyes:     General: No scleral icterus.    Conjunctiva/sclera: Conjunctivae normal.  Neck:     Thyroid: No thyromegaly.  Cardiovascular:     Rate and Rhythm: Normal rate and regular rhythm.     Heart sounds: Normal heart sounds. No murmur heard. Pulmonary:     Effort: Pulmonary effort is normal. No respiratory distress.     Breath sounds: Normal breath sounds. No wheezing.  Musculoskeletal:        General: Normal range of motion.     Cervical back: Neck supple.  Lymphadenopathy:     Cervical: No cervical adenopathy.  Skin:    General: Skin is warm and dry.     Findings: No rash.  Neurological:     Mental Status: He is alert and oriented to person, place, and time.     Coordination: Coordination normal.  Psychiatric:        Behavior: Behavior normal.      Assessment & Plan:   Problem List Items Addressed This Visit       Cardiovascular and Mediastinum   HTN (hypertension), benign - Primary   Relevant Medications   amLODipine (NORVASC) 5 MG tablet   Other Relevant Orders    BMP8+EGFR   Essential hypertension   Relevant Medications   amLODipine (NORVASC) 5 MG tablet     Respiratory   COPD GOLD II     Genitourinary   Overactive bladder     Other   Hyperlipidemia LDL goal <100   Relevant Medications   amLODipine (NORVASC) 5 MG tablet  Other Visit Diagnoses     Anxiety and depression       Relevant Orders   AMB Referral to [facility] Coordinaton     Increase amlodipine to 1-1/2 or 7.5 mg.  We will also do a referral for CCM to [facility] for [place].   Follow up plan: Return in about 3 months (around 12/09/2020), or if symptoms worsen or fail to improve, for htn and hld.  Counseling provided for all of the vaccine components No orders of the defined types were placed in this encounter.   Caryl Pina, MD [facility] 09/08/2020, 8:06 AM

## 2020-09-09 ENCOUNTER — Telehealth: Payer: Self-pay

## 2020-09-09 NOTE — Chronic Care Management (AMB) (Signed)
  Chronic Care Management   Note  09/09/2020 Name: [name] MRN: [id number] DOB: 1935/11/03  [name] is a 84 y.o. year old male who is a primary care patient of Dettinger, Fransisca Kaufmann, MD. I reached out to [name] by phone today in response to a referral sent by [name] PCP, Dettinger, Fransisca Kaufmann, MD      [name] was given information about Chronic Care Management services today including:  CCM service includes personalized support from designated clinical staff supervised by his physician, including individualized plan of care and coordination with other care providers 24/7 contact phone numbers for assistance for urgent and routine care needs. Service will only be billed when office clinical staff spend 20 minutes or more in a month to coordinate care. Only one practitioner may furnish and bill the service in a calendar month. The patient may stop CCM services at any time (effective at the end of the month) by phone call to the office staff. The patient will be responsible for cost sharing (co-pay) of up to 20% of the service fee (after annual deductible is met).  Patient agreed to services and verbal consent obtained.   Follow up plan: Telephone appointment with care management team member scheduled for:09/11/2020  Noreene Larsson, [address], [place], [place] [id number] Direct Dial: [phone] [email or web address] Website: [email or web address]

## 2020-09-11 ENCOUNTER — Ambulatory Visit (INDEPENDENT_AMBULATORY_CARE_PROVIDER_SITE_OTHER): Payer: Medicare Other | Admitting: Licensed Clinical Social Worker

## 2020-09-11 DIAGNOSIS — E785 Hyperlipidemia, unspecified: Secondary | ICD-10-CM

## 2020-09-11 DIAGNOSIS — J449 Chronic obstructive pulmonary disease, unspecified: Secondary | ICD-10-CM | POA: Diagnosis not present

## 2020-09-11 DIAGNOSIS — I10 Essential (primary) hypertension: Secondary | ICD-10-CM

## 2020-09-11 DIAGNOSIS — Z8546 Personal history of malignant neoplasm of prostate: Secondary | ICD-10-CM | POA: Diagnosis not present

## 2020-09-11 NOTE — Patient Instructions (Signed)
Visit Information  PATIENT GOALS:  Goals Addressed             This Visit's Progress    Manage My Emotions; Manage Grief issues faced       Timeframe:  Short-Term Goal Priority:  Medium Progress: On Track Start Date:             09/11/20                Expected End Date:           12/04/20            Follow Up Date 10/26/20   Manage Emotions; Manage Grief issues faced    Why is this important?   When you are stressed, down or upset, your body reacts too.  For example, your blood pressure may get higher; you may have a headache or stomachache.  When your emotions get the best of you, your body's ability to fight off cold and flu gets weak.  These steps will help you manage your emotions.    Patient Self Care Activities:  Completes ADLs daily as needed Drives to appointments and to complete errands as needed Has support from daughter and from his grandchildren Attends medical appointments as scheduled  Patient Deficits: Some pain issues Grief issues faced  Patient Goals: Talk with RNCM or LCSW as needed in next 30 days for CCM support Attend medical appointments as scheduled for next 30 days Allow time to rest and relax to manage stress issues Socialize via phone or in person as able with family members in next 30 days  Follow Up Plan:  LCSW to call client on 10/26/20    [name].Reve Crocket MSW, LCSW Licensed Clinical Social Worker [facility] Care Management [phone]

## 2020-09-11 NOTE — Chronic Care Management (AMB) (Signed)
Chronic Care Management    Clinical Social Work Note  09/11/2020 Name: [name] MRN: [id number] DOB: 1935/10/14  [name] is a 85 y.o. year old male who is a primary care patient of Dettinger, Fransisca Kaufmann, MD. The CCM team was consulted to assist the patient with chronic disease management and/or care coordination needs related to: [employer] .   Engaged with patient by telephone for initial visit in response to provider referral for social work chronic care management and care coordination services.   Consent to Services:  The patient was given the following information about Chronic Care Management services today, agreed to services, and gave verbal consent: 1. CCM service includes personalized support from designated clinical staff supervised by the primary care provider, including individualized plan of care and coordination with other care providers 2. 24/7 contact phone numbers for assistance for urgent and routine care needs. 3. Service will only be billed when office clinical staff spend 20 minutes or more in a month to coordinate care. 4. Only one practitioner may furnish and bill the service in a calendar month. 5.The patient may stop CCM services at any time (effective at the end of the month) by phone call to the office staff. 6. The patient will be responsible for cost sharing (co-pay) of up to 20% of the service fee (after annual deductible is met). Patient agreed to services and consent obtained.  Patient agreed to services and consent obtained.   Assessment: Review of patient past medical history, allergies, medications, and health status, including review of relevant consultants reports was performed today as part of a comprehensive evaluation and provision of chronic care management and care coordination services.     SDOH (Social Determinants of Health) assessments and interventions performed:  SDOH Interventions    Flowsheet Row Most Recent Value  SDOH  Interventions   Depression Interventions/Treatment  Medication        Advanced Directives Status: See Vynca application for related entries.  CCM Care Plan  Allergies  Allergen Reactions   Lamisil [Terbinafine Hcl] Other (See Comments)    Loss of taste     Outpatient Encounter Medications as of 09/11/2020  Medication Sig   amLODipine (NORVASC) 5 MG tablet Take 1.5 tablets (7.5 mg total) by mouth daily.   aspirin 81 MG tablet Take 81 mg by mouth 2 (two) times daily.    cetirizine (ZYRTEC) 10 MG tablet Take 1 tablet (10 mg total) by mouth daily.   famotidine (PEPCID) 20 MG tablet One after supper   furosemide (LASIX) 20 MG tablet Take 1 tablet (20 mg total) by mouth daily.   mirabegron ER (MYRBETRIQ) 50 MG TB24 tablet Take 1 tablet (50 mg total) by mouth daily.   pantoprazole (PROTONIX) 40 MG tablet TAKE ONE TABLET BY MOUTH EVERY DAY 30-60 MINUTES BEFORE FIRST MEAL OF THE DAY.   QUEtiapine (SEROQUEL) 50 MG tablet Take 1 tablet (50 mg total) by mouth at bedtime.   REPATHA SURECLICK 415 MG/ML SOAJ INJECT 140 MG INTO THE SKIN EVERY 14 DAYS   Tiotropium Bromide-Olodaterol (STIOLTO RESPIMAT) 2.5-2.5 MCG/ACT AERS Inhale 2 puffs into the lungs daily.   valsartan (DIOVAN) 160 MG tablet Take 1 tablet (160 mg total) by mouth 2 (two) times daily.   Vitamin D, Ergocalciferol, (DRISDOL) 1.25 MG (50000 UNIT) CAPS capsule Take 1 capsule (50,000 Units total) by mouth once a week.   No facility-administered encounter medications on file as of 09/11/2020.    Patient Active Problem List   Diagnosis  Date Noted   Upper airway cough syndrome 04/22/2020   Essential hypertension 04/05/2018   Pain of lower extremity 04/05/2018   PVC's (premature ventricular contractions) 05/03/2017   Overactive bladder 06/08/2015   DOE (dyspnea on exertion) 11/29/2013   Aftercare following surgery of the circulatory system, NEC 01/31/2013   Occlusion and stenosis of carotid artery without mention of cerebral infarction  01/31/2012   History of prostate cancer 08/20/2008   Hyperlipidemia LDL goal <100 08/20/2008   ULNAR NEUROPATHY 08/20/2008   HTN (hypertension), benign 08/20/2008   CAD 08/20/2008   CEREBROVASCULAR DISEASE 08/20/2008   CARPAL TUNNEL SYNDROME, HX OF 08/20/2008   COPD GOLD II 09/24/2007    Conditions to be addressed/monitored: Monitor client management of grief issues faced   Care Plan : LCSW care plan  Updates made by Katha Cabal, LCSW since 09/11/2020 12:00 AM     Problem: Emotional Distress      Goal: Emotional Health Supported; Manage Grief issues   Start Date: 09/11/2020  Expected End Date: 12/04/2020  This Visit's Progress: On track  Priority: Medium  Note:    Current Barriers:  Grief issues related to death of his wife nearly 2 years ago Some pain issues   Clinical Social Work Goal(s):  Over the next 30 days, patient will work with SW to discuss depression and depression management for client  Client will attend scheduled medical appointments in next 30 days  Interventions:  1:1 collaboration with Dr. Vonna Kotyk Dettinger regarding development and update of comprehensive plan of care as evidence by provider attestation and co-signature  Talked with client about mobility of client Talked with client about breathing challenges of client (he said hip is limiting factor when walking and not so much breathing difficulty) Talked with client about medication procurement Talked with client about mood of client (he said his wife passed away nearly 2 years ago and it is hard sometimes to manage, difficult being alone.  He said that he and his wife had been married for 28 years .)  Talked with client about RNCM support through CCM program Talked with client about social support system of client (good support system) Talked with client abut upcoming client medical appointments Client and LCSW talked of his use of inhalers as prescribed Talked with client about his history of  [place] service Talked with client about vision of client (he said he has had cataract surgery) Talked with client about pain of client  Talked with client about local church support Talked with client about [facility], [place] and the bereavement support program through [place], [place], [place] Talked with client about meal provision of client  Patient Self Care Activities:  Completes ADLs daily as needed Drives to appointments and to complete errands as needed Has support from daughter and from his grandchildren Attends medical appointments as scheduled  Patient Deficits: Some pain issues Grief issues faced  Patient Goals: Talk with RNCM or LCSW as needed in next 30 days for CCM support Attend medical appointments as scheduled for next 30 days Allow time to rest and relax to manage stress issues Socialize via phone or in person as able with family members in next 30 days  Follow Up Plan:  LCSW to all client on 10/26/20    [name].Avrian Delfavero MSW, LCSW Licensed Clinical Social Worker [facility] Care Management [phone]

## 2020-09-19 ENCOUNTER — Emergency Department (HOSPITAL_COMMUNITY): Payer: Medicare Other

## 2020-09-19 ENCOUNTER — Emergency Department (HOSPITAL_COMMUNITY)
Admission: EM | Admit: 2020-09-19 | Discharge: 2020-09-20 | Disposition: A | Discharge status: Home / Self Care | Payer: Medicare Other | Attending: Emergency Medicine | Admitting: Emergency Medicine

## 2020-09-19 ENCOUNTER — Ambulatory Visit
Admission: EM | Admit: 2020-09-19 | Discharge: 2020-09-19 | Disposition: A | Discharge status: Home / Self Care | Payer: Medicare Other

## 2020-09-19 ENCOUNTER — Other Ambulatory Visit: Payer: Self-pay

## 2020-09-19 ENCOUNTER — Encounter (HOSPITAL_COMMUNITY): Payer: Self-pay | Admitting: *Deleted

## 2020-09-19 DIAGNOSIS — Z7982 Long term (current) use of aspirin: Secondary | ICD-10-CM | POA: Insufficient documentation

## 2020-09-19 DIAGNOSIS — J449 Chronic obstructive pulmonary disease, unspecified: Secondary | ICD-10-CM | POA: Insufficient documentation

## 2020-09-19 DIAGNOSIS — Z20822 Contact with and (suspected) exposure to covid-19: Secondary | ICD-10-CM | POA: Diagnosis not present

## 2020-09-19 DIAGNOSIS — R519 Headache, unspecified: Secondary | ICD-10-CM | POA: Diagnosis not present

## 2020-09-19 DIAGNOSIS — Z7951 Long term (current) use of inhaled steroids: Secondary | ICD-10-CM | POA: Insufficient documentation

## 2020-09-19 DIAGNOSIS — I10 Essential (primary) hypertension: Secondary | ICD-10-CM | POA: Diagnosis not present

## 2020-09-19 DIAGNOSIS — Z8546 Personal history of malignant neoplasm of prostate: Secondary | ICD-10-CM | POA: Diagnosis not present

## 2020-09-19 DIAGNOSIS — R42 Dizziness and giddiness: Secondary | ICD-10-CM | POA: Diagnosis not present

## 2020-09-19 DIAGNOSIS — Z87891 Personal history of nicotine dependence: Secondary | ICD-10-CM | POA: Insufficient documentation

## 2020-09-19 DIAGNOSIS — Z79899 Other long term (current) drug therapy: Secondary | ICD-10-CM | POA: Insufficient documentation

## 2020-09-19 DIAGNOSIS — I2510 Atherosclerotic heart disease of native coronary artery without angina pectoris: Secondary | ICD-10-CM | POA: Insufficient documentation

## 2020-09-19 LAB — URINALYSIS, ROUTINE W REFLEX MICROSCOPIC
Bilirubin Urine: NEGATIVE
Glucose, UA: NEGATIVE mg/dL
Hgb urine dipstick: NEGATIVE
Ketones, ur: NEGATIVE mg/dL
Leukocytes,Ua: NEGATIVE
Nitrite: NEGATIVE
Protein, ur: NEGATIVE mg/dL
Specific Gravity, Urine: 1.002 — ABNORMAL LOW (ref 1.005–1.030)
pH: 7 (ref 5.0–8.0)

## 2020-09-19 LAB — BASIC METABOLIC PANEL
Anion gap: 9 (ref 5–15)
BUN: 10 mg/dL (ref 8–23)
CO2: 23 mmol/L (ref 22–32)
Calcium: 9.3 mg/dL (ref 8.9–10.3)
Chloride: 104 mmol/L (ref 98–111)
Creatinine, Ser: 1.12 mg/dL (ref 0.61–1.24)
GFR, Estimated: >60 mL/min (ref >60)
Glucose, Bld: 101 mg/dL — ABNORMAL HIGH (ref 70–99)
Potassium: 3.7 mmol/L (ref 3.5–5.1)
Sodium: 136 mmol/L (ref 135–145)

## 2020-09-19 LAB — CBC
HCT: 40.8 % (ref 39.0–52.0)
Hemoglobin: 13.5 g/dL (ref 13.0–17.0)
MCH: 31.2 pg (ref 26.0–34.0)
MCHC: 33.1 g/dL (ref 30.0–36.0)
MCV: 94.2 fL (ref 80.0–100.0)
Platelets: 193 10*3/uL (ref 150–400)
RBC: 4.33 MIL/uL (ref 4.22–5.81)
RDW: 13.3 % (ref 11.5–15.5)
WBC: 6.8 10*3/uL (ref 4.0–10.5)
nRBC: 0 % (ref 0.0–0.2)

## 2020-09-19 LAB — RESP PANEL BY RT-PCR (FLU A&B, COVID) ARPGX2
Influenza A by PCR: NEGATIVE
Influenza B by PCR: NEGATIVE
SARS Coronavirus 2 by RT PCR: NEGATIVE

## 2020-09-19 MED ORDER — LORAZEPAM 0.5 MG PO TABS
0.5000 mg | ORAL_TABLET | Freq: Once | ORAL | Status: AC
  Administered 2020-09-19: 0.5 mg via ORAL
  Filled 2020-09-19: qty 1

## 2020-09-19 MED ORDER — SODIUM CHLORIDE 0.9 % IV BOLUS
1000.0000 mL | Freq: Once | INTRAVENOUS | Status: AC
  Administered 2020-09-19: 1000 mL via INTRAVENOUS

## 2020-09-19 MED ORDER — MECLIZINE HCL 12.5 MG PO TABS
12.5000 mg | ORAL_TABLET | Freq: Once | ORAL | Status: AC
  Administered 2020-09-19: 12.5 mg via ORAL
  Filled 2020-09-19: qty 1

## 2020-09-19 MED ORDER — DIAZEPAM 5 MG/ML IJ SOLN
5.0000 mg | Freq: Once | INTRAMUSCULAR | Status: AC
  Administered 2020-09-19: 5 mg via INTRAVENOUS
  Filled 2020-09-19: qty 2

## 2020-09-19 MED ORDER — LORAZEPAM 2 MG/ML IJ SOLN: 0.5000 mg | Freq: Once | INTRAMUSCULAR | Status: DC

## 2020-09-19 NOTE — ED Provider Notes (Signed)
[facility] EMERGENCY DEPARTMENT Provider Note   CSN: [id number] Arrival date & time: 09/19/20  1337     History Chief Complaint  Patient presents with   Dizziness    [name] is a 85 y.o. male.  HPI  Patient with significant medical history of carotid occlusions, COPD, hypertension, PVCs presents with chief complaint of dizziness.  Patient states is it came on suddenly this morning 5am, states it comes on when he changes positions, worsened when he stands up but then dissipates after a couple seconds.  Patient describes the dizziness as feeling off balance, he denies having headaches, change in vision, paresthesias or weakness the upper lower extremities, denies recent head trauma, is not on anticoagulant, denies history of IV drug use, denies associated fevers or chills.  Patient has never had this in the past, denies any alleviating factors.  Does not endorse chest pain, shortness of breath, abdominal pain.  Past Medical History:  Diagnosis Date   Adenomatous colon polyp 11/1999   Allergy    Blood transfusion without reported diagnosis    Cancer [facility])    Prostate   Carotid artery occlusion    Cataract    COPD (chronic obstructive pulmonary disease) (HCC)    GERD (gastroesophageal reflux disease)    Hyperlipidemia    Hypertension    Internal hemorrhoids    PVC (premature ventricular contraction)     Patient Active Problem List   Diagnosis Date Noted   Upper airway cough syndrome 04/22/2020   Essential hypertension 04/05/2018   Pain of lower extremity 04/05/2018   PVC's (premature ventricular contractions) 05/03/2017   Overactive bladder 06/08/2015   DOE (dyspnea on exertion) 11/29/2013   Aftercare following surgery of the circulatory system, NEC 01/31/2013   Occlusion and stenosis of carotid artery without mention of cerebral infarction 01/31/2012   History of prostate cancer 08/20/2008   Hyperlipidemia LDL goal <100 08/20/2008   ULNAR NEUROPATHY 08/20/2008    HTN (hypertension), benign 08/20/2008   CAD 08/20/2008   CEREBROVASCULAR DISEASE 08/20/2008   CARPAL TUNNEL SYNDROME, HX OF 08/20/2008   COPD GOLD II 09/24/2007    Past Surgical History:  Procedure Laterality Date   APPENDECTOMY     carotid surgery     CATARACT EXTRACTION     both eyes   COLONOSCOPY     POLYPECTOMY     TRANSURETHRAL RESECTION OF PROSTATE     UPPER GASTROINTESTINAL ENDOSCOPY     UPPER GI ENDOSCOPY  Nov. 3, 2014   Upper endoscopy with diatation       Family History  Problem Relation Age of Onset   Colon cancer Mother    Cancer Mother        colon   Heart disease Father        Heart Disease before age 1   Heart attack Father 36   Arthritis Sister    Esophageal cancer Neg Hx    Rectal cancer Neg Hx    Stomach cancer Neg Hx    Colon polyps Neg Hx     Social History   Tobacco Use   Smoking status: Former    Packs/day: 1.50    Years: 40.00    Pack years: 60.00    Types: Cigarettes    Quit date: 01/19/1997    Years since quitting: 23.6   Smokeless tobacco: Never  Vaping Use   Vaping Use: Never used  Substance Use Topics   Alcohol use: No   Drug use: No    Home  Medications Prior to Admission medications   Medication Sig Start Date End Date Taking? Authorizing Provider  amLODipine (NORVASC) 5 MG tablet Take 1.5 tablets (7.5 mg total) by mouth daily. 09/08/20   Dettinger, Fransisca Kaufmann, MD  aspirin 81 MG tablet Take 81 mg by mouth 2 (two) times daily.     [provider]  cetirizine (ZYRTEC) 10 MG tablet Take 1 tablet (10 mg total) by mouth daily. 06/08/20   Dettinger, Fransisca Kaufmann, MD  famotidine (PEPCID) 20 MG tablet One after supper 08/04/20   Tanda Rockers, MD  furosemide (LASIX) 20 MG tablet Take 1 tablet (20 mg total) by mouth daily. 06/08/20   Dettinger, Fransisca Kaufmann, MD  mirabegron ER (MYRBETRIQ) 50 MG TB24 tablet Take 1 tablet (50 mg total) by mouth daily. 06/08/20   Dettinger, Fransisca Kaufmann, MD  pantoprazole (PROTONIX) 40 MG tablet TAKE ONE TABLET  BY MOUTH EVERY DAY 30-60 MINUTES BEFORE FIRST MEAL OF THE DAY. 09/01/20   Tanda Rockers, MD  QUEtiapine (SEROQUEL) 50 MG tablet Take 1 tablet (50 mg total) by mouth at bedtime. 06/08/20   Dettinger, Fransisca Kaufmann, MD  REPATHA SURECLICK 176 MG/ML SOAJ INJECT 140 MG INTO THE SKIN EVERY 14 DAYS 01/06/20   Minus Breeding, MD  Tiotropium Bromide-Olodaterol (STIOLTO RESPIMAT) 2.5-2.5 MCG/ACT AERS Inhale 2 puffs into the lungs daily. 08/04/20   Tanda Rockers, MD  valsartan (DIOVAN) 160 MG tablet Take 1 tablet (160 mg total) by mouth 2 (two) times daily. 07/23/20   Minus Breeding, MD  Vitamin D, Ergocalciferol, (DRISDOL) 1.25 MG (50000 UNIT) CAPS capsule Take 1 capsule (50,000 Units total) by mouth once a week. 09/16/19   Dettinger, Fransisca Kaufmann, MD    Allergies    Lamisil Reva Bores hcl]  Review of Systems   Review of Systems  Constitutional:  Negative for chills and fever.  HENT:  Negative for congestion.   Respiratory:  Negative for shortness of breath.   Cardiovascular:  Negative for chest pain.  Gastrointestinal:  Positive for nausea. Negative for abdominal pain and vomiting.  Genitourinary:  Negative for enuresis.  Musculoskeletal:  Negative for back pain.  Skin:  Negative for rash.  Neurological:  Positive for dizziness. Negative for headaches.  Hematological:  Does not bruise/bleed easily.   Physical Exam Updated Vital Signs BP (!) 186/92   Pulse 72   Temp 97.8 F (36.6 C) (Oral)   Resp 19   Ht 5\' 11"  (1.803 m)   Wt 86.6 kg   SpO2 100%   BMI 26.64 kg/m   Physical Exam Vitals and nursing note reviewed.  Constitutional:      General: He is not in acute distress.    Appearance: He is not ill-appearing.  HENT:     Head: Normocephalic and atraumatic.     Nose: No congestion.  Eyes:     Extraocular Movements: Extraocular movements intact.     Conjunctiva/sclera: Conjunctivae normal.     Pupils: Pupils are equal, round, and reactive to light.  Cardiovascular:     Rate and Rhythm:  Normal rate and regular rhythm.     Pulses: Normal pulses.     Heart sounds: No murmur heard.   No friction rub. No gallop.  Pulmonary:     Effort: No respiratory distress.     Breath sounds: No wheezing, rhonchi or rales.  Abdominal:     Palpations: Abdomen is soft.     Tenderness: There is no abdominal tenderness.  Musculoskeletal:     Right  lower leg: No edema.     Left lower leg: No edema.     Comments: Patient has 5 of 5 strength, neurovascular intact  in the upper and lower extremities.  Skin:    General: Skin is warm and dry.  Neurological:     Mental Status: He is alert.     GCS: GCS eye subscore is 4. GCS verbal subscore is 5. GCS motor subscore is 6.     Cranial Nerves: No facial asymmetry.     Sensory: Sensation is intact.     Motor: No weakness.     Coordination: Romberg sign negative. Finger-Nose-Finger Test normal.     Gait: Gait is intact.     Comments: Current nerves II through XII are grossly intact patient is having no difficulty word finding.   Psychiatric:        Mood and Affect: Mood normal.    ED Results / Procedures / Treatments   Labs (all labs ordered are listed, but only abnormal results are displayed) Labs Reviewed  BASIC METABOLIC PANEL - Abnormal; Notable for the following components:      Result Value   Glucose, Bld 101 (*)    All other components within normal limits  URINALYSIS, ROUTINE W REFLEX MICROSCOPIC - Abnormal; Notable for the following components:   Color, Urine COLORLESS (*)    Specific Gravity, Urine 1.002 (*)    All other components within normal limits  RESP PANEL BY RT-PCR (FLU A&B, COVID) ARPGX2  CBC    EKG EKG Interpretation  Date/Time:  Saturday September 19 2020 15:10:17 EDT Ventricular Rate:  64 PR Interval:  197 QRS Duration: 118 QT Interval:  454 QTC Calculation: 469 R Axis:   -37 Text Interpretation: Sinus rhythm Nonspecific IVCD with LAD Confirmed by Thamas Jaegers ([id number]) on 09/19/2020 5:04:02 PM  Radiology CT  Head Wo Contrast  Result Date: 09/19/2020 CLINICAL DATA:  Headache, chronic, new features or increased frequency EXAM: CT HEAD WITHOUT CONTRAST TECHNIQUE: Contiguous axial images were obtained from the base of the skull through the vertex without intravenous contrast. COMPARISON:  None. FINDINGS: Brain: Left-sided subdural collection is most prominently affecting the parietal region and measures up to 9 mm, series 4, image 53. This is primarily isodense to adjacent CSF, with minimal complexity in the frontal region, for example series 4, image 33. No evidence of acute subdural blood. There is no significant associated mass effect. No midline shift. Generalized atrophy and periventricular white matter hypodensity consistent with chronic small vessel ischemia. There is a remote lacunar infarct in the left basal ganglia. No hydrocephalus. The basilar cisterns are patent. No evidence of intracranial mass or mass effect Vascular: Atherosclerosis of skullbase vasculature without hyperdense vessel or abnormal calcification. Skull: No fracture or focal lesion. Sinuses/Orbits: Chronic right maxillary sinusitis with mucosal thickening and cortical thickening/sclerosis. Occasional mucosal thickening throughout ethmoid air cells. No mastoid effusion. Bilateral cataract resection. Other: None. IMPRESSION: 1. Left-sided subdural collection is most prominently affecting the parietal region and measures up to 9 mm. This is primarily isodense to adjacent CSF, with minimal complexity in the frontal region. This may represent a chronic subdural hematoma or subdural hygroma (acute or chronic). No prior exams available for comparison. No evidence of acute subdural blood. There is no significant associated mass effect or midline shift. 2. Generalized atrophy and chronic small vessel ischemia. Remote lacunar infarct in the left basal ganglia. 3. Chronic right maxillary sinusitis. These results were called by telephone at the time of  interpretation  on 09/19/2020 at 4:58 pm to provider Deno Etienne , who verbally acknowledged these results. Electronically Signed   By: Keith Rake M.D.   On: 09/19/2020 16:58    Procedures Procedures   Medications Ordered in ED Medications  LORazepam (ATIVAN) injection 0.5 mg (has no administration in time range)  sodium chloride 0.9 % bolus 1,000 mL (0 mLs Intravenous Stopped 09/19/20 1615)  meclizine (ANTIVERT) tablet 12.5 mg (12.5 mg Oral Given 09/19/20 1614)  LORazepam (ATIVAN) tablet 0.5 mg (0.5 mg Oral Given 09/19/20 1627)    ED Course  I have reviewed the triage vital signs and the nursing notes.  Pertinent labs & imaging results that were available during my care of the patient were reviewed by me and considered in my medical decision making (see chart for details).    MDM Rules/Calculators/A&P                         Initial impression-patient presents with dizziness.  He is alert, does not appear in acute stress, vital signs reassuring.  Will obtain basic lab work-up, obtain CT head for further evaluation.  Work-up-CBC unremarkable, BMP shows slight hyperglycemia 101, UA negative, CT head shows left sided subdural collection most probably affecting the parietal region measuring up to 9 mm, likely CSF collection may represent chronic subdural hematoma.  There is no noted mass-effect.  EKG sinus without signs of ischemia orthostatic positive.  Reassessment-provided patient with fluids as well as meclizine.  Patient continues to have dizziness with change in position.  Notified of fluid collection on CT head, will consult with neurosurgery for further recommendations.  Patient continues to have dizziness with change in position I suspect this is most likely peripheral vertigo but cannot exclude possibility of a cerebellar stroke.  Will send down to [facility] for MRI.  Patient is agreement with this.   Consult-spoke with Dr. Kathyrn Sheriff states this most likely a chronic issue  does not need  emergent treatment at this point.  He can follow-up  3 weeks from now as an outpatient.  Does not think this could be contributing to his symptoms.  Spoke with Dr. Shirlyn Goltz who will accept the [facility].  Rule out-low suspicion for intracranial head bleed and/or mass as no acute findings seen on CT head, low suspicion for CVA as there is no neurodeficits present on exam but I cannot exclude possibility of a cerebellar stroke will need further work-up with MRI at [facility].  Low suspicion for systemic infection and/or meningitis there is no meningeal sign present my exam, vital signs reassuring, no leukocytosis on CBC.  Plan-transfer ED to ED for MRI rule out cerebellar stroke.  If negative patient can be discharged home, follow-up with Dr. Kathyrn Sheriff in the next 3 weeks for further evaluation of fluid collection. Final Clinical Impression(s) / ED Diagnoses Final diagnoses:  Dizziness    Rx / DC Orders ED Discharge Orders     None        Aron Baba 09/19/20 1917    Luna Fuse, MD 09/25/20 [phone]

## 2020-09-19 NOTE — ED Notes (Signed)
Pt arrived via [facility] from [place], pt A&O x 4.

## 2020-09-19 NOTE — ED Notes (Signed)
Contacted MRI to confirm pt status. Pt should be transported in the next 30 minutes.

## 2020-09-19 NOTE — ED Notes (Signed)
Patient transported to MRI 

## 2020-09-19 NOTE — ED Triage Notes (Signed)
Pt presents with c/o dizziness that began this morning and vomited once

## 2020-09-19 NOTE — ED Triage Notes (Signed)
Sent from urgent care due to ekg

## 2020-09-20 MED ORDER — MECLIZINE HCL 25 MG PO TABS: 25.0000 mg | ORAL_TABLET | Freq: Three times a day (TID) | ORAL | 0 refills | Status: DC | PRN

## 2020-09-20 NOTE — ED Provider Notes (Signed)
Patient transferred from [facility] for MRI.  Patient with dizziness, CT scan shows hygroma.  MRI has now been performed.  No acute findings, no sign of stroke or any other worrisome findings that would explain the patient's dizziness.  This is likely peripheral vertigo.  He is feeling better here and is appropriate for discharge.  Prescribed meclizine.  Follow-up with Dr. Kathyrn Sheriff in 3 weeks for recheck of the subdural.   Orpah Greek, MD 09/20/20 [phone]

## 2020-09-28 DIAGNOSIS — X32XXXD Exposure to sunlight, subsequent encounter: Secondary | ICD-10-CM | POA: Diagnosis not present

## 2020-09-28 DIAGNOSIS — L570 Actinic keratosis: Secondary | ICD-10-CM | POA: Diagnosis not present

## 2020-10-05 ENCOUNTER — Telehealth: Payer: Self-pay | Admitting: Family Medicine

## 2020-10-05 NOTE — Telephone Encounter (Signed)
Informed pt that he has not had his vitamin d level checked and it is nursing protocol to deny refill. Pt would like to ask Dettinger to refill until his appt that is scheduled at the end of Sept.

## 2020-10-06 ENCOUNTER — Other Ambulatory Visit: Payer: Self-pay

## 2020-10-06 MED ORDER — VITAMIN D3 125 MCG (5000 UT) PO CAPS: 5000.0000 [IU] | ORAL_CAPSULE | Freq: Every day | ORAL | 3 refills | Status: DC

## 2020-10-06 NOTE — Telephone Encounter (Signed)
Switch him to vitamin D 5000 international units daily and send a 90-day prescription with 3 refills for him, he can continue on this dose for maintenance

## 2020-10-06 NOTE — Telephone Encounter (Signed)
Pt informed and understood. New Rx for Vit D 5000iu sent to mail order pharmacy.

## 2020-10-13 DIAGNOSIS — G9608 Other cranial cerebrospinal fluid leak: Secondary | ICD-10-CM | POA: Diagnosis not present

## 2020-10-16 ENCOUNTER — Ambulatory Visit: Payer: Medicare Other | Admitting: Internal Medicine

## 2020-10-26 ENCOUNTER — Telehealth: Payer: Medicare Other

## 2020-11-04 ENCOUNTER — Encounter: Payer: Self-pay | Admitting: Internal Medicine

## 2020-11-04 ENCOUNTER — Other Ambulatory Visit: Payer: Self-pay

## 2020-11-04 ENCOUNTER — Ambulatory Visit (INDEPENDENT_AMBULATORY_CARE_PROVIDER_SITE_OTHER): Payer: Medicare Other | Admitting: Internal Medicine

## 2020-11-04 DIAGNOSIS — J449 Chronic obstructive pulmonary disease, unspecified: Secondary | ICD-10-CM

## 2020-11-04 MED ORDER — STIOLTO RESPIMAT 2.5-2.5 MCG/ACT IN AERS: 2.0000 | INHALATION_SPRAY | Freq: Every day | RESPIRATORY_TRACT | 3 refills | Status: DC

## 2020-11-04 NOTE — Assessment & Plan Note (Signed)
Quit smoking 1998 - PFT's  07/17/2020  FEV1 1.88 (67 % ) ratio 0.65  p 13 % improvement from saba p nothing prior to study with DLCO  17.67 (73%) corrects to 3.88 (99%)  for alv volume and FV curve classic concave pattern  - 07/17/2020  After extensive coaching inhaler device,  effectiveness =    90% with SMI > trial of stiolto 2 puffs each am  - Labs ordered 07/17/2020  :    alpha one AT phenotype  MM  Pt is Group B in terms of symptom/risk and laba/lama therefore appropriate rx at this point >>>  stiolto appropriate, much improved over baseline   F/u can be yearly          Each maintenance medication was reviewed in detail including emphasizing most importantly the difference between maintenance and prns and under what circumstances the prns are to be triggered using an action plan format where appropriate.  Total time for H and P, chart review, counseling, reviewing [facility]  device(s) and generating customized AVS unique to this office visit / same day charting = 25 min

## 2020-11-04 NOTE — Progress Notes (Signed)
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 Pt c/o SOB x 5 years, worse x 1 1/5 years. He states he gets winded walking to the mailbox. He is using his ventolin inhaler 4 x per wk on average.   Dyspnea:  Daily 250 ft uphill to mailbox can make it s stopping at slower pace Cough: dry, daytime Sleep: fine on side/ 2 pillows / flat bad SABA use: not really helping, never prechallenges or rechallenges  rec Stop ramapril  Start on ibesartan 20 mg daily and your symptoms should gradually resolve Stop trelegy  Only use your albuterol as a rescue medication to be used if you can't catch your breath Change prilosec Take 30-60 min before first meal of the day    06/03/2020  f/u ov/[name] re: UACS/? ACE case  Chief Complaint  Patient presents with   Follow-up    Breathing is unchanged since the last visit. He has not been exerting himself much at all. He rarely uses the albuterol inhaler. He states he been wheezing more often. He has cough occ with clear sputum.   Dyspnea:  No longer mailbox walking  Cough: assoc with watery rhinitis x years aware of it at hs but blows nose and then ok for rest of noct "allergy much worse when stopped prilosec in past"  Sleeping: bed is flat /on side 2 pillows  SABA use: not using  02: none  Covid status:   X 3  rec  To get the most out of exercise, you need to be continuously aware that you are  short of breath, but never out of breath, for 30 minutes daily.  Ok to Try albuterol 15 min before an activity that you know would make you short of breath and see if it makes any difference and if makes none then don't take it after activity unless you can't catch your breath. Stop prilosec and Pantoprazole (protonix) 40 mg (same 2 prilosec)   Take  30-60 min before first meal of the day and Pepcid (famotidine)  20 mg after supper  until return to office  GERD diet  Prednisone 10 mg take  4 each am x 2 days,   2 each am x 2 days,  1 each am x 2 days and stop      07/17/2020  f/u ov/[name] re:  GOLD II copd on prn saba only  Chief Complaint  Patient presents with   Follow-up    Still getting SOB with activities, occ dry cough  Dyspnea:  Does fine at [employer] leaning on cart which he only does after using pain med o/w more limited by back than breathing Cough: same despite reflux rx and off acei, no change  Sleeping: flat bed / 2 pillows  SABA use: using 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   - Allergy profile 07/17/2020 >  Eos 0.4 /  IgE  120 > only symptoms in spring controlled with zyrtec   11/04/2020  f/u ov/[name] re: GOLD II COPD / MM for alpha one Chief Complaint  Patient presents with   Follow-up    He does not have any concerns.    Dyspnea:  [employer] walking s cart/ no HC parking - overall much better on stiolto  Cough: none  Sleeping: no resp cc  SABA use: none  02: none  Covid status:   vax x 4    No obvious day to  day or daytime variability or assoc excess/ purulent sputum or mucus plugs or hemoptysis or cp or chest tightness, subjective wheeze or overt sinus or hb symptoms.   Sleeping  without nocturnal  or early am exacerbation  of respiratory  c/o's or need for noct saba. Also denies any obvious fluctuation of symptoms with weather or environmental changes or other aggravating or alleviating factors except as outlined above   No unusual exposure hx or h/o childhood pna/ asthma or knowledge of premature birth.  Current Allergies, Complete Past Medical History, Past Surgical History, Family History, and Social History were reviewed in [employer] record.  ROS  The following are not active complaints unless bolded Hoarseness, sore throat, dysphagia, dental problems, itching, sneezing,  nasal congestion or discharge of excess mucus or purulent secretions, ear ache,   fever, chills, sweats, unintended wt loss or wt gain, classically pleuritic or exertional cp,  orthopnea pnd or arm/hand swelling  or leg swelling, presyncope, palpitations, abdominal pain, anorexia, nausea, vomiting, diarrhea  or change in bowel habits or change in bladder habits, change in stools or change in urine, dysuria, hematuria,  rash, arthralgias, visual complaints, headache, numbness, weakness or ataxia or problems with walking or coordination,  change in mood or  memory.        Current Meds  Medication Sig   amLODipine (NORVASC) 5 MG tablet Take 1.5 tablets (7.5 mg total) by mouth daily.   aspirin 81 MG tablet Take 81 mg by mouth 2 (two) times daily.    cetirizine (ZYRTEC) 10 MG tablet Take 1 tablet (10 mg total) by mouth daily.   Cholecalciferol (VITAMIN D3) 125 MCG (5000 UT) CAPS Take 1 capsule (5,000 Units total) by mouth daily.   famotidine (PEPCID) 20 MG tablet One after supper   furosemide (LASIX) 20 MG tablet Take 1 tablet (20 mg total) by mouth daily.   meclizine (ANTIVERT) 25 MG tablet Take 1 tablet  (25 mg total) by mouth 3 (three) times daily as needed for dizziness.   mirabegron ER (MYRBETRIQ) 50 MG TB24 tablet Take 1 tablet (50 mg total) by mouth daily.   pantoprazole (PROTONIX) 40 MG tablet TAKE ONE TABLET BY MOUTH EVERY DAY 30-60 MINUTES BEFORE FIRST MEAL OF THE DAY.   QUEtiapine (SEROQUEL) 50 MG tablet Take 1 tablet (50 mg total) by mouth  at bedtime.   REPATHA SURECLICK XX123456 MG/ML SOAJ INJECT 140 MG INTO THE SKIN EVERY 14 DAYS   Tiotropium Bromide-Olodaterol (STIOLTO RESPIMAT) 2.5-2.5 MCG/ACT AERS Inhale 2 puffs into the lungs daily.   valsartan (DIOVAN) 160 MG tablet Take 1 tablet (160 mg total) by mouth 2 (two) times daily.             Past Medical History:  Diagnosis Date   Adenomatous colon polyp 11/1999   Allergy    Blood transfusion without reported diagnosis    Cancer [facility])    Prostate   Carotid artery occlusion    Cataract    COPD (chronic obstructive pulmonary disease) (HCC)    GERD (gastroesophageal reflux disease)    Hyperlipidemia    Hypertension    Internal hemorrhoids    PVC (premature ventricular contraction)       Objective:     11/04/2020       192   07/17/2020       192  06/03/20 191 lb 12.8 oz (87 kg)  05/13/20 190 lb (86.2 kg)  04/22/20 189 lb (85.7 kg)     Vital signs reviewed  11/04/2020  - Note at rest 02 sats  96% on RA   General appearance:    pleasant amb wm nad   HEENT : pt wearing mask not removed for exam due to covid - 19 concerns.   NECK :  without JVD/Nodes/TM/ nl carotid upstrokes bilaterally   LUNGS: no acc muscle use,  Min barrel  contour chest wall with bilateral  slightly decreased bs s audible wheeze and  without cough on insp or exp maneuvers and min  Hyperresonant  to  percussion bilaterally     CV:  RRR  no s3 or murmur or increase in P2, and no edema   ABD:  soft and nontender with pos end  insp Hoover's  in the supine position. No bruits or organomegaly appreciated, bowel sounds nl  MS:   Nl gait/  ext warm  without deformities, calf tenderness, cyanosis or clubbing No obvious joint restrictions   SKIN: warm and dry without lesions    NEURO:  alert, approp, nl sensorium with  no motor or cerebellar deficits apparent.             Assessment

## 2020-11-04 NOTE — Patient Instructions (Signed)
Continue Stiolto 2 puffs each am   Please schedule a follow up visit in 12 months but call sooner if needed  - [place] office is fine

## 2020-11-27 ENCOUNTER — Other Ambulatory Visit: Payer: Self-pay | Admitting: Internal Medicine

## 2020-12-04 ENCOUNTER — Ambulatory Visit (INDEPENDENT_AMBULATORY_CARE_PROVIDER_SITE_OTHER): Payer: Medicare Other | Admitting: Licensed Clinical Social Worker

## 2020-12-04 DIAGNOSIS — R0600 Dyspnea, unspecified: Secondary | ICD-10-CM

## 2020-12-04 DIAGNOSIS — J449 Chronic obstructive pulmonary disease, unspecified: Secondary | ICD-10-CM

## 2020-12-04 DIAGNOSIS — E785 Hyperlipidemia, unspecified: Secondary | ICD-10-CM

## 2020-12-04 DIAGNOSIS — Z8546 Personal history of malignant neoplasm of prostate: Secondary | ICD-10-CM

## 2020-12-04 DIAGNOSIS — I10 Essential (primary) hypertension: Secondary | ICD-10-CM

## 2020-12-04 DIAGNOSIS — R0609 Other forms of dyspnea: Secondary | ICD-10-CM

## 2020-12-04 NOTE — Patient Instructions (Signed)
Visit Information  PATIENT GOALS:  Goals Addressed             This Visit's Progress    Manage My Emotions; Manage Grief issues faced; Manage Depression issues       Timeframe:  Short-Term Goal Priority:  Medium Progress: On Track Start Date:             12/04/20                Expected End Date:           03/02/21            Follow Up Date 01/18/21 at 10:00 AM   Manage Emotions; Manage Grief issues faced ; Manage Depression issues   Why is this important?   When you are stressed, down or upset, your body reacts too.  For example, your blood pressure may get higher; you may have a headache or stomachache.  When your emotions get the best of you, your body's ability to fight off cold and flu gets weak.  These steps will help you manage your emotions.    Patient Self Care Activities:  Completes ADLs daily as needed Drives to appointments and to complete errands as needed Has support from daughter and from his grandchildren Attends medical appointments as scheduled  Patient Deficits: Some pain issues Grief issues faced  Patient Goals: Talk with RNCM or LCSW as needed in next 30 days for CCM support Attend medical appointments as scheduled for next 30 days Allow time to rest and relax to manage stress issues Socialize via phone or in person as able with family members in next 30 days  Follow Up Plan:  LCSW to call client on 01/18/21 at 10:00 AM to assess client needs at that time           [name].Kensie Susman MSW, LCSW Licensed Clinical Social Worker [facility] Care Management [phone]

## 2020-12-04 NOTE — Chronic Care Management (AMB) (Signed)
Chronic Care Management    Clinical Social Work Note  12/04/2020 Name: [name] MRN: [id number] DOB: Jan 18, 1936  [name] is a 85 y.o. year old male who is a primary care patient of Dettinger, Fransisca Kaufmann, MD. The CCM team was consulted to assist the patient with chronic disease management and/or care coordination needs related to: [employer] .   Engaged with patient by telephone for follow up visit in response to provider referral for social work chronic care management and care coordination services.   Consent to Services:  The patient was given information about Chronic Care Management services, agreed to services, and gave verbal consent prior to initiation of services.  Please see initial visit note for detailed documentation.   Patient agreed to services and consent obtained.   Assessment: Review of patient past medical history, allergies, medications, and health status, including review of relevant consultants reports was performed today as part of a comprehensive evaluation and provision of chronic care management and care coordination services.     SDOH (Social Determinants of Health) assessments and interventions performed:  SDOH Interventions    Flowsheet Row Most Recent Value  SDOH Interventions   Physical Activity Interventions Other (Comments)  [client has hip pain when he is walking]  Depression Interventions/Treatment  Medication        Advanced Directives Status: See Vynca application for related entries.  CCM Care Plan  Allergies  Allergen Reactions   Lamisil [Terbinafine Hcl] Other (See Comments)    Loss of taste     Outpatient Encounter Medications as of 12/04/2020  Medication Sig   amLODipine (NORVASC) 5 MG tablet Take 1.5 tablets (7.5 mg total) by mouth daily.   aspirin 81 MG tablet Take 81 mg by mouth 2 (two) times daily.    cetirizine (ZYRTEC) 10 MG tablet Take 1 tablet (10 mg total) by mouth daily.   Cholecalciferol (VITAMIN D3)  125 MCG (5000 UT) CAPS Take 1 capsule (5,000 Units total) by mouth daily.   famotidine (PEPCID) 20 MG tablet One after supper   furosemide (LASIX) 20 MG tablet Take 1 tablet (20 mg total) by mouth daily.   meclizine (ANTIVERT) 25 MG tablet Take 1 tablet (25 mg total) by mouth 3 (three) times daily as needed for dizziness.   mirabegron ER (MYRBETRIQ) 50 MG TB24 tablet Take 1 tablet (50 mg total) by mouth daily.   pantoprazole (PROTONIX) 40 MG tablet TAKE ONE TABLET BY MOUTH EVERY DAY 30-60 MINUTES BEFORE FIRST MEAL OF THE DAY.   QUEtiapine (SEROQUEL) 50 MG tablet Take 1 tablet (50 mg total) by mouth at bedtime.   REPATHA SURECLICK XX123456 MG/ML SOAJ INJECT 140 MG INTO THE SKIN EVERY 14 DAYS   Tiotropium Bromide-Olodaterol (STIOLTO RESPIMAT) 2.5-2.5 MCG/ACT AERS Inhale 2 puffs into the lungs daily.   valsartan (DIOVAN) 160 MG tablet Take 1 tablet (160 mg total) by mouth 2 (two) times daily.   No facility-administered encounter medications on file as of 12/04/2020.    Patient Active Problem List   Diagnosis Date Noted   Upper airway cough syndrome 04/22/2020   Essential hypertension 04/05/2018   Pain of lower extremity 04/05/2018   PVC's (premature ventricular contractions) 05/03/2017   Overactive bladder 06/08/2015   DOE (dyspnea on exertion) 11/29/2013   Aftercare following surgery of the circulatory system, NEC 01/31/2013   Occlusion and stenosis of carotid artery without mention of cerebral infarction 01/31/2012   History of prostate cancer 08/20/2008   Hyperlipidemia LDL goal <100 08/20/2008  ULNAR NEUROPATHY 08/20/2008   HTN (hypertension), benign 08/20/2008   CAD 08/20/2008   CEREBROVASCULAR DISEASE 08/20/2008   CARPAL TUNNEL SYNDROME, HX OF 08/20/2008   COPD GOLD II 09/24/2007    Conditions to be addressed/monitored: monitor client management of grief and depression issues faced  Care Plan : LCSW care plan  Updates made by Katha Cabal, LCSW since 12/04/2020 12:00 AM      Problem: Emotional Distress      Goal: Emotional Health Supported; Manage Grief issues   Start Date: 12/04/2020  Expected End Date: 03/02/2021  This Visit's Progress: On track  Recent Progress: On track  Priority: Medium  Note:    Current Barriers:  Grief issues related to death of his wife nearly 2 years ago Some pain issues when walking  Clinical Social Work Goal(s):  Over the next 30 days, patient will work with SW to discuss depression and depression management for client  Client will attend scheduled medical appointments in next 30 days Client will communicate as needed with RNCM in next 30 days to discuss nursing needs of client  Interventions:  1:1 collaboration with Dr. Vonna Kotyk Dettinger regarding development and update of comprehensive plan of care as evidence by provider attestation and co-signature  Talked with client about mobility of client Talked with client about breathing challenges of client (he said hip is limiting factor when walking and not so much breathing difficulty) Talked with client about medication procurement Talked with client about mood of client (he said his wife passed away nearly 2 years ago and it is hard sometimes to manage, difficult being alone. He said he has decreased motivation. Sometimes he said it is difficult for him to get started in doing daily activities. He does speak of hip pain when he walks. He does have strong family support.)  Talked with client about RNCM support through CCM program Talked with client about social support system of client (good family support system) Talked with client about his history of [place] service Talked with client about vision of client   Talked with client about pain of client  Talked with client about local church support He said he is active at his local church Talked with client about meal provision of client Talked with client about transport needs of clent Talked with client about appetite of  client. He said he usually eats a good supper meal; but, after supper meal , he often snacks at night  Patient Self Care Activities:  Completes ADLs daily as needed Drives to appointments and to complete errands as needed Has support from daughter and from his grandchildren Attends medical appointments as scheduled  Patient Deficits: Some pain issues Grief issues faced  Patient Goals: Talk with RNCM or LCSW as needed in next 30 days for CCM support Attend medical appointments as scheduled for next 30 days Allow time to rest and relax to manage stress issues Socialize via phone or in person as able with family members in next 30 days  Follow Up Plan:  LCSW to call client on  01/18/21 at 10:00 AM to assess client needs at that time    [name].Cayci Mcnabb MSW, LCSW Licensed Clinical Social Worker [facility] Care Management [phone]

## 2020-12-14 ENCOUNTER — Encounter: Payer: Self-pay | Admitting: Family Medicine

## 2020-12-14 ENCOUNTER — Ambulatory Visit (INDEPENDENT_AMBULATORY_CARE_PROVIDER_SITE_OTHER): Payer: Medicare Other | Admitting: Family Medicine

## 2020-12-14 ENCOUNTER — Other Ambulatory Visit: Payer: Self-pay

## 2020-12-14 VITALS — BP 144/78 | HR 73 | Ht 71.0 in | Wt 193.0 lb

## 2020-12-14 DIAGNOSIS — Z23 Encounter for immunization: Secondary | ICD-10-CM | POA: Diagnosis not present

## 2020-12-14 DIAGNOSIS — I10 Essential (primary) hypertension: Secondary | ICD-10-CM

## 2020-12-14 DIAGNOSIS — E785 Hyperlipidemia, unspecified: Secondary | ICD-10-CM | POA: Diagnosis not present

## 2020-12-14 DIAGNOSIS — N3281 Overactive bladder: Secondary | ICD-10-CM

## 2020-12-14 NOTE — Progress Notes (Signed)
BP (!) 144/78   Pulse 73   Ht 5' 11" (1.803 m)   Wt 193 lb (87.5 kg)   SpO2 94%   BMI 26.92 kg/m    Subjective:   Patient ID: [name], male    DOB: 08/27/1935, 85 y.o.   MRN: [id number]  HPI: [name] is a 85 y.o. male presenting on 12/14/2020 for Medical Management of Chronic Issues, Hyperlipidemia, Hypertension, and Insomnia   HPI Hypertension Patient is currently on amlodipine and furosemide and valsartan, and their blood pressure today is 144/78. Patient denies any lightheadedness or dizziness. Patient denies headaches, blurred vision, chest pains, shortness of breath, or weakness. Denies any side effects from medication and is content with current medication.   Hyperlipidemia Patient is coming in for recheck of his hyperlipidemia. The patient is currently taking Repatha. They deny any issues with myalgias or history of liver damage from it. They deny any focal numbness or weakness or chest pain.   Overactive bladder recheck Patient is taking Myrbetriq for overactive bladder and he feels like he is doing well.  If not perfect but is helping him some.  He said the co-pay is high but not actively he is doing well.  Relevant past medical, surgical, family and social history reviewed and updated as indicated. Interim medical history since our last visit reviewed. Allergies and medications reviewed and updated.  Review of Systems  Constitutional:  Negative for chills and fever.  Eyes:  Negative for visual disturbance.  Respiratory:  Negative for shortness of breath and wheezing.   Cardiovascular:  Negative for chest pain and leg swelling.  Musculoskeletal:  Negative for back pain and gait problem.  Skin:  Negative for rash.  Neurological:  Negative for dizziness, weakness and light-headedness.  All other systems reviewed and are negative.  Per HPI unless specifically indicated above   Allergies as of 12/14/2020       Reactions   Lamisil [terbinafine Hcl]  Other (See Comments)   Loss of taste         Medication List        Accurate as of December 14, 2020  8:23 AM. If you have any questions, ask your nurse or doctor.          amLODipine 5 MG tablet Commonly known as: NORVASC Take 1.5 tablets (7.5 mg total) by mouth daily.   aspirin 81 MG tablet Take 81 mg by mouth 2 (two) times daily.   cetirizine 10 MG tablet Commonly known as: ZYRTEC Take 1 tablet (10 mg total) by mouth daily.   famotidine 20 MG tablet Commonly known as: Pepcid One after supper   furosemide 20 MG tablet Commonly known as: LASIX Take 1 tablet (20 mg total) by mouth daily.   meclizine 25 MG tablet Commonly known as: ANTIVERT Take 1 tablet (25 mg total) by mouth 3 (three) times daily as needed for dizziness.   mirabegron ER 50 MG Tb24 tablet Commonly known as: Myrbetriq Take 1 tablet (50 mg total) by mouth daily.   pantoprazole 40 MG tablet Commonly known as: PROTONIX TAKE ONE TABLET BY MOUTH EVERY DAY 30-60 MINUTES BEFORE FIRST MEAL OF THE DAY.   QUEtiapine 50 MG tablet Commonly known as: SEROQUEL Take 1 tablet (50 mg total) by mouth at bedtime.   Repatha SureClick 387 MG/ML Soaj Generic drug: Evolocumab INJECT 140 MG INTO THE SKIN EVERY 14 DAYS   Stiolto Respimat 2.5-2.5 MCG/ACT Aers Generic drug: Tiotropium Bromide-Olodaterol Inhale 2 puffs into the lungs  daily.   valsartan 160 MG tablet Commonly known as: Diovan Take 1 tablet (160 mg total) by mouth 2 (two) times daily.   Vitamin D3 125 MCG (5000 UT) Caps Take 1 capsule (5,000 Units total) by mouth daily.         Objective:   BP (!) 144/78   Pulse 73   Ht 5' 11" (1.803 m)   Wt 193 lb (87.5 kg)   SpO2 94%   BMI 26.92 kg/m   Wt Readings from Last 3 Encounters:  12/14/20 193 lb (87.5 kg)  11/04/20 192 lb (87.1 kg)  09/19/20 191 lb (86.6 kg)    Physical Exam Vitals and nursing note reviewed.  Constitutional:      General: He is not in acute distress.     Appearance: He is well-developed. He is not diaphoretic.  Eyes:     General: No scleral icterus.    Conjunctiva/sclera: Conjunctivae normal.  Neck:     Thyroid: No thyromegaly.  Cardiovascular:     Rate and Rhythm: Normal rate and regular rhythm.     Heart sounds: Normal heart sounds. No murmur heard. Pulmonary:     Effort: Pulmonary effort is normal. No respiratory distress.     Breath sounds: Normal breath sounds. No wheezing.  Musculoskeletal:        General: Normal range of motion.     Cervical back: Neck supple.  Lymphadenopathy:     Cervical: No cervical adenopathy.  Skin:    General: Skin is warm and dry.     Findings: No rash.  Neurological:     Mental Status: He is alert and oriented to person, place, and time.     Coordination: Coordination normal.  Psychiatric:        Behavior: Behavior normal.      Assessment & Plan:   Problem List Items Addressed This Visit       Cardiovascular and Mediastinum   HTN (hypertension), benign   Relevant Orders   CMP14+EGFR     Genitourinary   Overactive bladder   Relevant Orders   CBC with Differential/Platelet     Other   Hyperlipidemia LDL goal <100 - Primary   Relevant Orders   Lipid panel    Blood pressure borderline, continue current medicines and monitor closely.  For age we will allow permissive hypertension.  No other change medication, follow-up after lab work Follow up plan: Return in about 3 months (around 03/15/2021), or if symptoms worsen or fail to improve, for Hypertension and cholesterol and chronic medical issues.  Counseling provided for all of the vaccine components Orders Placed This Encounter  Procedures   CBC with Differential/Platelet   CMP14+EGFR   Lipid panel    Caryl Pina, MD [facility] 12/14/2020, 8:23 AM

## 2020-12-15 LAB — CMP14+EGFR
ALT: 29 IU/L (ref 0–44)
AST: 44 IU/L — ABNORMAL HIGH (ref 0–40)
Albumin/Globulin Ratio: 1.8 (ref 1.2–2.2)
Albumin: 4.3 g/dL (ref 3.6–4.6)
Alkaline Phosphatase: 84 IU/L (ref 44–121)
BUN/Creatinine Ratio: 8 — ABNORMAL LOW (ref 10–24)
BUN: 9 mg/dL (ref 8–27)
Bilirubin Total: 0.2 mg/dL (ref 0.0–1.2)
CO2: 20 mmol/L (ref 20–29)
Calcium: 9.4 mg/dL (ref 8.6–10.2)
Chloride: 105 mmol/L (ref 96–106)
Creatinine, Ser: 1.06 mg/dL (ref 0.76–1.27)
Globulin, Total: 2.4 g/dL (ref 1.5–4.5)
Glucose: 139 mg/dL — ABNORMAL HIGH (ref 70–99)
Potassium: 4 mmol/L (ref 3.5–5.2)
Sodium: 143 mmol/L (ref 134–144)
Total Protein: 6.7 g/dL (ref 6.0–8.5)
eGFR: 70 mL/min/{1.73_m2} (ref >59)

## 2020-12-15 LAB — CBC WITH DIFFERENTIAL/PLATELET
Basophils Absolute: 0.1 10*3/uL (ref 0.0–0.2)
Basos: 2 %
EOS (ABSOLUTE): 0.4 10*3/uL (ref 0.0–0.4)
Eos: 6 %
Hematocrit: 41.8 % (ref 37.5–51.0)
Hemoglobin: 13.9 g/dL (ref 13.0–17.7)
Immature Grans (Abs): 0.1 10*3/uL (ref 0.0–0.1)
Immature Granulocytes: 1 %
Lymphocytes Absolute: 2 10*3/uL (ref 0.7–3.1)
Lymphs: 33 %
MCH: 30.8 pg (ref 26.6–33.0)
MCHC: 33.3 g/dL (ref 31.5–35.7)
MCV: 93 fL (ref 79–97)
Monocytes Absolute: 0.5 10*3/uL (ref 0.1–0.9)
Monocytes: 8 %
Neutrophils Absolute: 3.2 10*3/uL (ref 1.4–7.0)
Neutrophils: 50 %
Platelets: 166 10*3/uL (ref 150–450)
RBC: 4.51 x10E6/uL (ref 4.14–5.80)
RDW: 13.7 % (ref 11.6–15.4)
WBC: 6.1 10*3/uL (ref 3.4–10.8)

## 2020-12-15 LAB — LIPID PANEL
Chol/HDL Ratio: 3.2 ratio (ref 0.0–5.0)
Cholesterol, Total: 126 mg/dL (ref 100–199)
HDL: 39 mg/dL — ABNORMAL LOW (ref >39)
LDL Chol Calc (NIH): 54 mg/dL (ref 0–99)
Triglycerides: 206 mg/dL — ABNORMAL HIGH (ref 0–149)
VLDL Cholesterol Cal: 33 mg/dL (ref 5–40)

## 2020-12-18 DIAGNOSIS — Z8546 Personal history of malignant neoplasm of prostate: Secondary | ICD-10-CM

## 2020-12-18 DIAGNOSIS — J449 Chronic obstructive pulmonary disease, unspecified: Secondary | ICD-10-CM

## 2020-12-18 DIAGNOSIS — I10 Essential (primary) hypertension: Secondary | ICD-10-CM

## 2020-12-18 DIAGNOSIS — E785 Hyperlipidemia, unspecified: Secondary | ICD-10-CM | POA: Diagnosis not present

## 2021-01-13 ENCOUNTER — Other Ambulatory Visit: Payer: Self-pay | Admitting: Internal Medicine

## 2021-01-18 ENCOUNTER — Ambulatory Visit (INDEPENDENT_AMBULATORY_CARE_PROVIDER_SITE_OTHER): Payer: Medicare Other | Admitting: Licensed Clinical Social Worker

## 2021-01-18 DIAGNOSIS — E785 Hyperlipidemia, unspecified: Secondary | ICD-10-CM

## 2021-01-18 DIAGNOSIS — I10 Essential (primary) hypertension: Secondary | ICD-10-CM

## 2021-01-18 DIAGNOSIS — J449 Chronic obstructive pulmonary disease, unspecified: Secondary | ICD-10-CM | POA: Diagnosis not present

## 2021-01-18 DIAGNOSIS — R0609 Other forms of dyspnea: Secondary | ICD-10-CM

## 2021-01-18 DIAGNOSIS — Z8546 Personal history of malignant neoplasm of prostate: Secondary | ICD-10-CM

## 2021-01-18 NOTE — Patient Instructions (Signed)
Visit Information  PATIENT GOALS:  Goals Addressed             This Visit's Progress    Manage My Emotions; Manage Grief issues faced; Manage Depression issues       Timeframe:  Short-Term Goal Priority:  Medium Progress: On Track Start Date:          01/18/21                Expected End Date:        04/16/21            Follow Up Date   03/16/21 at 10:00 AM   Manage Emotions; Manage Grief issues faced ; Manage Depression issues   Why is this important?   When you are stressed, down or upset, your body reacts too.  For example, your blood pressure may get higher; you may have a headache or stomachache.  When your emotions get the best of you, your body's ability to fight off cold and flu gets weak.  These steps will help you manage your emotions.    Patient Self Care Activities:  Completes ADLs daily as needed Drives to appointments and to complete errands as needed Has support from daughter and from his grandchildren Attends medical appointments as scheduled  Patient Deficits: Some pain issues Grief issues faced  Patient Goals: Talk with RNCM or LCSW as needed in next 30 days for CCM support Attend medical appointments as scheduled for next 30 days Allow time to rest and relax to manage stress issues Socialize via phone or in person as able with family members in next 30 days  Follow Up Plan:  LCSW to call client on 03/16/21 at 10:00 AM to assess client needs at that time     [name].Jinan Biggins MSW, LCSW Licensed Clinical Social Worker [facility] Care Management [phone]

## 2021-01-18 NOTE — Chronic Care Management (AMB) (Signed)
Chronic Care Management    Clinical Social Work Note  01/18/2021 Name: [name] MRN: [id number] DOB: Jul 10, 1935  [name] is a 85 y.o. year old male who is a primary care patient of Dettinger, Anthony Kaufmann, MD. The CCM team was consulted to assist the patient with chronic disease management and/or care coordination needs related to: [employer] .   Engaged with patient by telephone for follow up visit in response to provider referral for social work chronic care management and care coordination services.   Consent to Services:  The patient was given information about Chronic Care Management services, agreed to services, and gave verbal consent prior to initiation of services.  Please see initial visit note for detailed documentation.   Patient agreed to services and consent obtained.   Assessment: Review of patient past medical history, allergies, medications, and health status, including review of relevant consultants reports was performed today as part of a comprehensive evaluation and provision of chronic care management and care coordination services.     SDOH (Social Determinants of Health) assessments and interventions performed:  SDOH Interventions    Flowsheet Row Most Recent Value  SDOH Interventions   Stress Interventions Provide Counseling  [client has stress related to grief issues faced,  client has stress related to managing medical needs]  Depression Interventions/Treatment  Medication        Advanced Directives Status: See Vynca application for related entries.  CCM Care Plan  Allergies  Allergen Reactions   Lamisil [Terbinafine Hcl] Other (See Comments)    Loss of taste     Outpatient Encounter Medications as of 01/18/2021  Medication Sig   amLODipine (NORVASC) 5 MG tablet Take 1.5 tablets (7.5 mg total) by mouth daily.   aspirin 81 MG tablet Take 81 mg by mouth 2 (two) times daily.    cetirizine (ZYRTEC) 10 MG tablet Take 1 tablet (10 mg  total) by mouth daily.   Cholecalciferol (VITAMIN D3) 125 MCG (5000 UT) CAPS Take 1 capsule (5,000 Units total) by mouth daily.   famotidine (PEPCID) 20 MG tablet TAKE 1 TABLET AFTER SUPPER   furosemide (LASIX) 20 MG tablet Take 1 tablet (20 mg total) by mouth daily.   meclizine (ANTIVERT) 25 MG tablet Take 1 tablet (25 mg total) by mouth 3 (three) times daily as needed for dizziness.   mirabegron ER (MYRBETRIQ) 50 MG TB24 tablet Take 1 tablet (50 mg total) by mouth daily.   pantoprazole (PROTONIX) 40 MG tablet TAKE ONE TABLET BY MOUTH EVERY DAY 30-60 MINUTES BEFORE FIRST MEAL OF THE DAY.   QUEtiapine (SEROQUEL) 50 MG tablet Take 1 tablet (50 mg total) by mouth at bedtime.   REPATHA SURECLICK 876 MG/ML SOAJ INJECT 140 MG INTO THE SKIN EVERY 14 DAYS   Tiotropium Bromide-Olodaterol (STIOLTO RESPIMAT) 2.5-2.5 MCG/ACT AERS Inhale 2 puffs into the lungs daily.   valsartan (DIOVAN) 160 MG tablet Take 1 tablet (160 mg total) by mouth 2 (two) times daily.   No facility-administered encounter medications on file as of 01/18/2021.    Patient Active Problem List   Diagnosis Date Noted   PVC's (premature ventricular contractions) 05/03/2017   Overactive bladder 06/08/2015   DOE (dyspnea on exertion) 11/29/2013   Aftercare following surgery of the circulatory system, NEC 01/31/2013   Occlusion and stenosis of carotid artery without mention of cerebral infarction 01/31/2012   History of prostate cancer 08/20/2008   Hyperlipidemia LDL goal <100 08/20/2008   ULNAR NEUROPATHY 08/20/2008   HTN (hypertension), benign 08/20/2008  CAD 08/20/2008   CEREBROVASCULAR DISEASE 08/20/2008   COPD GOLD II 09/24/2007    Conditions to be addressed/monitored: monitor client management of grief issues and of depression issues  Care Plan : LCSW care plan  Updates made by Katha Cabal, LCSW since 01/18/2021 12:00 AM     Problem: Emotional Distress      Goal: Emotional Health Supported; Manage Grief issues    Start Date: 01/18/2021  Expected End Date: 04/16/2021  This Visit's Progress: On track  Recent Progress: On track  Priority: Medium  Note:    Current Barriers:  Grief issues related to death of his wife nearly 2 years ago Some pain issues when walking  Clinical Social Work Goal(s):  Over the next 30 days, patient will work with SW to discuss depression and depression management for client  Client will attend scheduled medical appointments in next 30 days Client will communicate as needed with RNCM in next 30 days to discuss nursing needs of client  Interventions:  1:1 collaboration with Dr. Vonna Kotyk Dettinger regarding development and update of comprehensive plan of care as evidence by provider attestation and co-signature  Discussed mobility issues of client. Client said he has some hip pain when walking.  He said he gets a little fatigued when walking but  hip pain issue is main issue in his walking. .  Discussed with client mood status of client.  He said it is sometimes hard for him to be alone at his home since his wife passed away nearly two years ago.  He said he has strong family support from his son and daughter and this is helpful. He also said he is very active at his church and this is helpful.. He has friends at church who are supportive.  LCSW and client also talked of Hospice Bereavement support.  He said Hospice representative had called him several times related to the death of his wife.  He was really appreciative of Hospice services for his wife while she was sick. Reviewed with client his insurance support. He said he had retired from [employer]. He said he had [employer] for [employer].  Reviewed with client transport needs. He said he drives his car to appointments and to complete errands as needed.  Patient Self Care Activities:  Completes ADLs daily as needed Drives to appointments and to complete errands as needed Has support from daughter and from his  grandchildren Attends medical appointments as scheduled  Patient Deficits: Some pain issues Grief issues faced  Patient Goals: Talk with RNCM or LCSW as needed in next 30 days for CCM support Attend medical appointments as scheduled for next 30 days Allow time to rest and relax to manage stress issues Socialize via phone or in person as able with family members in next 30 days  Follow Up Plan:  LCSW to call client on  03/16/21 at 10:00 AM to assess client needs at that time     [name].Narmeen Kerper MSW, LCSW Licensed Clinical Social Worker [facility] Care Management [phone]

## 2021-02-02 ENCOUNTER — Other Ambulatory Visit: Payer: Self-pay

## 2021-02-02 MED ORDER — PANTOPRAZOLE SODIUM 40 MG PO TBEC: 40.0000 mg | DELAYED_RELEASE_TABLET | Freq: Every day | ORAL | 2 refills | Status: DC

## 2021-03-12 ENCOUNTER — Ambulatory Visit (INDEPENDENT_AMBULATORY_CARE_PROVIDER_SITE_OTHER): Payer: Medicare Other | Admitting: Family Medicine

## 2021-03-12 ENCOUNTER — Encounter: Payer: Self-pay | Admitting: Family Medicine

## 2021-03-12 ENCOUNTER — Other Ambulatory Visit: Payer: Self-pay

## 2021-03-12 VITALS — BP 130/69 | HR 74 | Ht 71.0 in | Wt 191.0 lb

## 2021-03-12 DIAGNOSIS — J449 Chronic obstructive pulmonary disease, unspecified: Secondary | ICD-10-CM

## 2021-03-12 DIAGNOSIS — E785 Hyperlipidemia, unspecified: Secondary | ICD-10-CM

## 2021-03-12 DIAGNOSIS — I10 Essential (primary) hypertension: Secondary | ICD-10-CM | POA: Diagnosis not present

## 2021-03-12 NOTE — Progress Notes (Signed)
BP 130/69    Pulse 74    Ht 5\' 11"  (1.803 m)    Wt 191 lb (86.6 kg)    SpO2 96%    BMI 26.64 kg/m    Subjective:   Patient ID: [name], male    DOB: [birth year], 85 y.o.   MRN: [id number]  HPI: [name] is a 85 y.o. male presenting on 03/12/2021 for Medical Management of Chronic Issues, Hyperlipidemia, and Hypertension   HPI Hypertension Patient is currently on amlodipine and valsartan, and their blood pressure today is 130/69. Patient denies any lightheadedness or dizziness. Patient denies headaches, blurred vision, chest pains, shortness of breath, or weakness. Denies any side effects from medication and is content with current medication.   Hyperlipidemia Patient is coming in for recheck of his hyperlipidemia. The patient is currently taking Repatha. They deny any issues with myalgias or history of liver damage from it. They deny any focal numbness or weakness or chest pain.   Patient sees pulmonology for COPD and sees cardiology for CAD  Relevant past medical, surgical, family and social history reviewed and updated as indicated. Interim medical history since our last visit reviewed. Allergies and medications reviewed and updated.  Review of Systems  Constitutional:  Positive for fatigue. Negative for chills and fever.  Eyes:  Negative for visual disturbance.  Respiratory:  Negative for shortness of breath and wheezing.   Cardiovascular:  Negative for chest pain and leg swelling.  Musculoskeletal:  Negative for back pain and gait problem.  Skin:  Negative for rash.  Neurological:  Negative for dizziness and light-headedness.  All other systems reviewed and are negative.  Per HPI unless specifically indicated above   Allergies as of 03/12/2021       Reactions   Lamisil [terbinafine Hcl] Other (See Comments)   Loss of taste         Medication List        Accurate as of March 12, 2021  8:46 AM. If you have any questions, ask your nurse or doctor.           STOP taking these medications    famotidine 20 MG tablet Commonly known as: PEPCID Stopped by: Worthy Rancher, MD   furosemide 20 MG tablet Commonly known as: LASIX Stopped by: Worthy Rancher, MD   meclizine 25 MG tablet Commonly known as: ANTIVERT Stopped by: Worthy Rancher, MD       TAKE these medications    amLODipine 5 MG tablet Commonly known as: NORVASC Take 1.5 tablets (7.5 mg total) by mouth daily.   aspirin 81 MG tablet Take 81 mg by mouth 2 (two) times daily.   cetirizine 10 MG tablet Commonly known as: ZYRTEC Take 1 tablet (10 mg total) by mouth daily.   mirabegron ER 50 MG Tb24 tablet Commonly known as: Myrbetriq Take 1 tablet (50 mg total) by mouth daily.   pantoprazole 40 MG tablet Commonly known as: PROTONIX Take 1 tablet (40 mg total) by mouth daily. TAKE ONE TABLET BY MOUTH EVERY DAY 30-60 MINUTES BEFORE FIRST MEAL OF THE DAY.   QUEtiapine 50 MG tablet Commonly known as: SEROQUEL Take 1 tablet (50 mg total) by mouth at bedtime.   Repatha SureClick 124 MG/ML Soaj Generic drug: Evolocumab INJECT 140 MG INTO THE SKIN EVERY 14 DAYS   Stiolto Respimat 2.5-2.5 MCG/ACT Aers Generic drug: Tiotropium Bromide-Olodaterol Inhale 2 puffs into the lungs daily.   valsartan 160 MG tablet Commonly known as: Diovan Take  1 tablet (160 mg total) by mouth 2 (two) times daily.   Vitamin D3 125 MCG (5000 UT) Caps Take 1 capsule (5,000 Units total) by mouth daily.         Objective:   BP 130/69    Pulse 74    Ht 5\' 11"  (1.803 m)    Wt 191 lb (86.6 kg)    SpO2 96%    BMI 26.64 kg/m   Wt Readings from Last 3 Encounters:  03/12/21 191 lb (86.6 kg)  12/14/20 193 lb (87.5 kg)  11/04/20 192 lb (87.1 kg)    Physical Exam Vitals and nursing note reviewed.  Constitutional:      General: He is not in acute distress.    Appearance: He is well-developed. He is not diaphoretic.  Eyes:     General: No scleral icterus.     Conjunctiva/sclera: Conjunctivae normal.  Neck:     Thyroid: No thyromegaly.  Cardiovascular:     Rate and Rhythm: Normal rate and regular rhythm.     Heart sounds: Normal heart sounds. No murmur heard. Pulmonary:     Effort: Pulmonary effort is normal. No respiratory distress.     Breath sounds: Normal breath sounds. No wheezing.  Musculoskeletal:        General: No swelling. Normal range of motion.     Cervical back: Neck supple.  Lymphadenopathy:     Cervical: No cervical adenopathy.  Skin:    General: Skin is warm and dry.     Findings: No rash.  Neurological:     Mental Status: He is alert and oriented to person, place, and time.     Coordination: Coordination normal.  Psychiatric:        Behavior: Behavior normal.      Assessment & Plan:   Problem List Items Addressed This Visit       Cardiovascular and Mediastinum   HTN (hypertension), benign     Respiratory   COPD GOLD II     Other   Hyperlipidemia LDL goal <100 - Primary    Continue current medicines, no changes.  Seems to be doing well. Follow up plan: Return in about 3 months (around 06/10/2021), or if symptoms worsen or fail to improve, for Physical and hypertension and cholesterol.  Counseling provided for all of the vaccine components No orders of the defined types were placed in this encounter.   Caryl Pina, MD [facility] 03/12/2021, 8:46 AM

## 2021-03-16 ENCOUNTER — Ambulatory Visit (INDEPENDENT_AMBULATORY_CARE_PROVIDER_SITE_OTHER): Payer: Medicare Other | Admitting: Licensed Clinical Social Worker

## 2021-03-16 ENCOUNTER — Other Ambulatory Visit: Payer: Self-pay | Admitting: Cardiology

## 2021-03-16 DIAGNOSIS — E785 Hyperlipidemia, unspecified: Secondary | ICD-10-CM

## 2021-03-16 DIAGNOSIS — I10 Essential (primary) hypertension: Secondary | ICD-10-CM

## 2021-03-16 DIAGNOSIS — R0609 Other forms of dyspnea: Secondary | ICD-10-CM

## 2021-03-16 DIAGNOSIS — J449 Chronic obstructive pulmonary disease, unspecified: Secondary | ICD-10-CM

## 2021-03-16 DIAGNOSIS — Z8546 Personal history of malignant neoplasm of prostate: Secondary | ICD-10-CM

## 2021-03-16 NOTE — Chronic Care Management (AMB) (Signed)
Chronic Care Management    Clinical Social Work Note  03/16/2021 Name: [name] MRN: [id number] DOB: 24-Apr-1935  Tou Hayner is a 85 y.o. year old male who is a primary care patient of Dettinger, Fransisca Kaufmann, MD. The CCM team was consulted to assist the patient with chronic disease management and/or care coordination needs related to: [employer] .   Engaged with patient by telephone for follow up visit in response to provider referral for social work chronic care management and care coordination services.   Consent to Services:  The patient was given information about Chronic Care Management services, agreed to services, and gave verbal consent prior to initiation of services.  Please see initial visit note for detailed documentation.   Patient agreed to services and consent obtained.   Assessment: Review of patient past medical history, allergies, medications, and health status, including review of relevant consultants reports was performed today as part of a comprehensive evaluation and provision of chronic care management and care coordination services.     SDOH (Social Determinants of Health) assessments and interventions performed:  SDOH Interventions    Flowsheet Row Most Recent Value  SDOH Interventions   Physical Activity Interventions Other (Comments)  [walking challenges. client has hip pain issues]  Stress Interventions Other (Comment)  [client has some stress related to managing medical needs. client has some stress related to managing hip pain issues]  Depression Interventions/Treatment  Medication        Advanced Directives Status: See Vynca application for related entries.  CCM Care Plan  Allergies  Allergen Reactions   Lamisil [Terbinafine Hcl] Other (See Comments)    Loss of taste     Outpatient Encounter Medications as of 03/16/2021  Medication Sig   amLODipine (NORVASC) 5 MG tablet Take 1.5 tablets (7.5 mg total) by mouth daily.    aspirin 81 MG tablet Take 81 mg by mouth 2 (two) times daily.    cetirizine (ZYRTEC) 10 MG tablet Take 1 tablet (10 mg total) by mouth daily.   Cholecalciferol (VITAMIN D3) 125 MCG (5000 UT) CAPS Take 1 capsule (5,000 Units total) by mouth daily.   mirabegron ER (MYRBETRIQ) 50 MG TB24 tablet Take 1 tablet (50 mg total) by mouth daily.   pantoprazole (PROTONIX) 40 MG tablet Take 1 tablet (40 mg total) by mouth daily. TAKE ONE TABLET BY MOUTH EVERY DAY 30-60 MINUTES BEFORE FIRST MEAL OF THE DAY.   QUEtiapine (SEROQUEL) 50 MG tablet Take 1 tablet (50 mg total) by mouth at bedtime.   REPATHA SURECLICK 384 MG/ML SOAJ INJECT 140 MG INTO THE SKIN EVERY 14 DAYS   Tiotropium Bromide-Olodaterol (STIOLTO RESPIMAT) 2.5-2.5 MCG/ACT AERS Inhale 2 puffs into the lungs daily.   valsartan (DIOVAN) 160 MG tablet Take 1 tablet (160 mg total) by mouth 2 (two) times daily.   No facility-administered encounter medications on file as of 03/16/2021.    Patient Active Problem List   Diagnosis Date Noted   PVC's (premature ventricular contractions) 05/03/2017   Overactive bladder 06/08/2015   DOE (dyspnea on exertion) 11/29/2013   Aftercare following surgery of the circulatory system, NEC 01/31/2013   Occlusion and stenosis of carotid artery without mention of cerebral infarction 01/31/2012   History of prostate cancer 08/20/2008   Hyperlipidemia LDL goal <100 08/20/2008   ULNAR NEUROPATHY 08/20/2008   HTN (hypertension), benign 08/20/2008   CAD 08/20/2008   CEREBROVASCULAR DISEASE 08/20/2008   COPD GOLD II 09/24/2007    Conditions to be addressed/monitored: monitor depression issues of  client; monitor grief issues of client  Care Plan : LCSW care plan  Updates made by Katha Cabal, LCSW since 03/16/2021 12:00 AM     Problem: Emotional Distress      Goal: Emotional Health Supported; Manage Grief issues   Start Date: 03/16/2021  Expected End Date: 06/10/2021  This Visit's Progress: On track   Recent Progress: On track  Priority: Medium  Note:    Current Barriers:  Grief issues related to death of his wife nearly 2 years ago Some pain issues when walking  Clinical Social Work Goal(s):  Over the next 30 days, patient will work with SW to discuss depression and depression management for client  Client will attend scheduled medical appointments in next 30 days Client will communicate as needed with RNCM in next 30 days to discuss nursing needs of client Over next 30 days, patient will work with SW to discuss grief issues of client and pain issues of client  Interventions:  1:1 collaboration with Dr. Vonna Kotyk Dettinger regarding development and update of comprehensive plan of care as evidence by provider attestation and co-signature  Discussed mobility issues of client. Client said he has some hip pain when walking.  He said he gets a little fatigued when walking but  hip pain issue is main issue in his walking. .  Discussed with client mood status of client.  He said it is sometimes hard for him to be alone at his home since his wife passed away nearly two years ago.  He said he has strong family support from his son and daughter and this is helpful. He also said he is very active at his church and this is helpful.. He has friends at church who are supportive. He is taking medications as prescribed. . Reviewed with client transport needs. He said he drives his car to appointments and to complete errands as needed. Discussed family support. He said he has support from his son, from his daughter and from his grandchildren.  He enjoys spending time with family members Reviewed energy level of client Discussed CCM services for client. Encouraged client to call RNCM as needed for CCM nursing support  Patient Self Care Activities:  Completes ADLs daily as needed Drives to appointments and to complete errands as needed Has support from daughter and from his grandchildren Attends medical  appointments as scheduled  Patient Deficits: Some pain issues Grief issues faced  Patient Goals: Talk with RNCM or LCSW as needed in next 30 days for CCM support Attend medical appointments as scheduled for next 30 days Allow time to rest and relax to manage stress issues Socialize via phone or in person as able with family members in next 30 days  Follow Up Plan:  LCSW to call client on  05/15/21 at 3:00 PM to assess client needs at that time     [name].Dafney Farler MSW, LCSW Licensed Clinical Social Worker [facility] Care Management [phone]

## 2021-03-16 NOTE — Patient Instructions (Addendum)
Visit Information  Patient Goals:  Manage Emotions; Manage Grief issues faced. Manage Depression issues  Timeframe:  Short-Term Goal Priority:  Medium Progress: On Track Start Date:       03/16/21                Expected End Date:      06/10/21            Follow Up Date   05/15/21 at 3:00 PM   Manage Emotions; Manage Grief issues faced ; Manage Depression issues   Why is this important?   When you are stressed, down or upset, your body reacts too.  For example, your blood pressure may get higher; you may have a headache or stomachache.  When your emotions get the best of you, your body's ability to fight off cold and flu gets weak.  These steps will help you manage your emotions.    Patient Self Care Activities:  Completes ADLs daily as needed Drives to appointments and to complete errands as needed Has support from daughter and from his grandchildren Attends medical appointments as scheduled  Patient Deficits: Some pain issues Grief issues faced  Patient Goals: Talk with RNCM or LCSW as needed in next 30 days for CCM support Attend medical appointments as scheduled for next 30 days Allow time to rest and relax to manage stress issues Socialize via phone or in person as able with family members in next 30 days  Follow Up Plan:  LCSW to call client on 05/15/21 at 3:00 PM to assess client needs at that time   [name].Jailon Schaible MSW, LCSW Licensed Clinical Social Worker [facility] Care Management [phone]

## 2021-03-24 ENCOUNTER — Other Ambulatory Visit: Payer: Self-pay | Admitting: *Deleted

## 2021-03-24 MED ORDER — PANTOPRAZOLE SODIUM 40 MG PO TBEC: 40.0000 mg | DELAYED_RELEASE_TABLET | Freq: Every day | ORAL | 3 refills | Status: DC

## 2021-03-25 ENCOUNTER — Ambulatory Visit (INDEPENDENT_AMBULATORY_CARE_PROVIDER_SITE_OTHER): Payer: Medicare Other | Admitting: Family

## 2021-03-25 ENCOUNTER — Encounter: Payer: Self-pay | Admitting: Family

## 2021-03-25 VITALS — BP 125/76 | HR 84 | Temp 97.2°F | Ht 71.0 in | Wt 190.0 lb

## 2021-03-25 DIAGNOSIS — M109 Gout, unspecified: Secondary | ICD-10-CM

## 2021-03-25 MED ORDER — PREDNISONE 20 MG PO TABS: 40.0000 mg | ORAL_TABLET | Freq: Every day | ORAL | 0 refills | Status: AC

## 2021-03-25 MED ORDER — COLCHICINE 0.6 MG PO TABS: ORAL_TABLET | ORAL | 0 refills | Status: DC

## 2021-03-25 NOTE — Patient Instructions (Signed)
Gout ?Gout is a condition that causes painful swelling of the joints. Gout is a type of inflammation of the joints (arthritis). This condition is caused by having too much uric acid in the body. Uric acid is a chemical that forms when the body breaks down substances called purines. Purines are important for building body proteins. ?When the body has too much uric acid, sharp crystals can form and build up inside the joints. This causes pain and swelling. Gout attacks can happen quickly and may be very painful (acute gout). Over time, the attacks can affect more joints and become more frequent (chronic gout). Gout can also cause uric acid to build up under the skin and inside the kidneys. ?What are the causes? ?This condition is caused by too much uric acid in your blood. This can happen because: ?Your kidneys do not remove enough uric acid from your blood. This is the most common cause. ?Your body makes too much uric acid. This can happen with some cancers and cancer treatments. It can also occur if your body is breaking down too many red blood cells (hemolytic anemia). ?You eat too many foods that are high in purines. These foods include organ meats and some seafood. Alcohol, especially beer, is also high in purines. ?A gout attack may be triggered by trauma or stress. ?What increases the risk? ?You are more likely to develop this condition if you: ?Have a family history of gout. ?Are male and middle-aged. ?Are male and have gone through menopause. ?Are obese. ?Frequently drink alcohol, especially beer. ?Are dehydrated. ?Lose weight too quickly. ?Have an organ transplant. ?Have lead poisoning. ?Take certain medicines, including aspirin, cyclosporine, diuretics, levodopa, and niacin. ?Have kidney disease. ?Have a skin condition called psoriasis. ?What are the signs or symptoms? ?An attack of acute gout happens quickly. It usually occurs in just one joint. The most common place is the big toe. Attacks often start  at night. Other joints that may be affected include joints of the feet, ankle, knee, fingers, wrist, or elbow. Symptoms of this condition may include: ?Severe pain. ?Warmth. ?Swelling. ?Stiffness. ?Tenderness. The affected joint may be very painful to touch. ?Shiny, red, or purple skin. ?Chills and fever. ?Chronic gout may cause symptoms more frequently. More joints may be involved. You may also have white or yellow lumps (tophi) on your hands or feet or in other areas near your joints. ?How is this diagnosed? ?This condition is diagnosed based on your symptoms, medical history, and physical exam. You may have tests, such as: ?Blood tests to measure uric acid levels. ?Removal of joint fluid with a thin needle (aspiration) to look for uric acid crystals. ?X-rays to look for joint damage. ?How is this treated? ?Treatment for this condition has two phases: treating an acute attack and preventing future attacks. Acute gout treatment may include medicines to reduce pain and swelling, including: ?NSAIDs. ?Steroids. These are strong anti-inflammatory medicines that can be taken by mouth (orally) or injected into a joint. ?Colchicine. This medicine relieves pain and swelling when it is taken soon after an attack. It can be given by mouth or through an IV. ?Preventive treatment may include: ?Daily use of smaller doses of NSAIDs or colchicine. ?Use of a medicine that reduces uric acid levels in your blood. ?Changes to your diet. You may need to see a dietitian about what to eat and drink to prevent gout. ?Follow these instructions at home: ?During a gout attack ? ?If directed, put ice on the affected area: ?  Put ice in a plastic bag. ?Place a towel between your skin and the bag. ?Leave the ice on for 20 minutes, 2-3 times a day. ?Raise (elevate) the affected joint above the level of your heart as often as possible. ?Rest the joint as much as possible. If the affected joint is in your leg, you may be given crutches to  use. ?Follow instructions from your health care provider about eating or drinking restrictions. ?Avoiding future gout attacks ?Follow a low-purine diet as told by your dietitian or health care provider. Avoid foods and drinks that are high in purines, including liver, kidney, anchovies, asparagus, herring, mushrooms, mussels, and beer. ?Maintain a healthy weight or lose weight if you are overweight. If you want to lose weight, talk with your health care provider. It is important that you do not lose weight too quickly. ?Start or maintain an exercise program as told by your health care provider. ?Eating and drinking ?Drink enough fluids to keep your urine pale yellow. ?If you drink alcohol: ?Limit how much you use to: ?0-1 drink a day for women. ?0-2 drinks a day for men. ?Be aware of how much alcohol is in your drink. In the [place] one drink equals one 12 oz bottle of beer (355 mL) one 5 oz glass of wine (148 mL), or one 1? oz glass of hard liquor (44 mL). ?General instructions ?Take over-the-counter and prescription medicines only as told by your health care provider. ?Do not drive or use heavy machinery while taking prescription pain medicine. ?Return to your normal activities as told by your health care provider. Ask your health care provider what activities are safe for you. ?Keep all follow-up visits as told by your health care provider. This is important. ?Contact a health care provider if you have: ?Another gout attack. ?Continuing symptoms of a gout attack after 10 days of treatment. ?Side effects from your medicines. ?Chills or a fever. ?Burning pain when you urinate. ?Pain in your lower back or belly. ?Get help right away if you: ?Have severe or uncontrolled pain. ?Cannot urinate. ?Summary ?Gout is painful swelling of the joints caused by inflammation. ?The most common site of pain is the big toe, but it can affect other joints in the body. ?Medicines and dietary changes can help to prevent and treat gout  attacks. ?This information is not intended to replace advice given to you by your health care provider. Make sure you discuss any questions you have with your health care provider. ?Document Revised: 09/15/2017 Document Reviewed: 09/27/2017 ?Elsevier Patient Education ? 2022 Elsevier Inc[employer] ? ?

## 2021-03-25 NOTE — Progress Notes (Signed)
Subjective:    Patient ID: [name], male    DOB: [birth year], 86 y.o.   MRN: [id number]  Chief Complaint  Patient presents with   Foot Swelling    And pain started yesterday.    HPI Pt presents to the office today with left foot swelling and pain that started two days ago. Denies any injury. He states the dorsum part of his foot is red. He reports his pain is a 10 out 10. States it hurts when he lays a sheet on it.  Denies any hx of gout.    Review of Systems  All other systems reviewed and are negative.     Objective:   Physical Exam Vitals reviewed.  Constitutional:      General: He is not in acute distress.    Appearance: He is well-developed.  HENT:     Head: Normocephalic.  Eyes:     General:        Right eye: No discharge.        Left eye: No discharge.     Pupils: Pupils are equal, round, and reactive to light.  Neck:     Thyroid: No thyromegaly.  Cardiovascular:     Rate and Rhythm: Normal rate and regular rhythm.     Heart sounds: Normal heart sounds. No murmur heard. Pulmonary:     Effort: Pulmonary effort is normal. No respiratory distress.     Breath sounds: Normal breath sounds. No wheezing.  Abdominal:     General: Bowel sounds are normal. There is no distension.     Palpations: Abdomen is soft.     Tenderness: There is no abdominal tenderness.  Musculoskeletal:        General: Tenderness present.     Cervical back: Normal range of motion and neck supple.     Comments: Left great toe and foot pain, redness and warmth. Tenderness with movement and palpation.   Skin:    General: Skin is warm and dry.     Findings: No erythema or rash.  Neurological:     Mental Status: He is alert and oriented to person, place, and time.     Cranial Nerves: No cranial nerve deficit.     Deep Tendon Reflexes: Reflexes are normal and symmetric.  Psychiatric:        Behavior: Behavior normal.        Thought Content: Thought content normal.        Judgment:  Judgment normal.      BP 125/76    Pulse 84    Temp (!) 97.2 F (36.2 C) (Temporal)    Ht 5' 11" (1.803 m)    Wt 190 lb (86.2 kg)    BMI 26.50 kg/m      Assessment & Plan:  [name] comes in today with chief complaint of Foot Swelling (And pain started yesterday.)   Diagnosis and orders addressed:  1. Acute gout involving toe of left foot, unspecified cause Low purine diet Force fluids Labs pending Start colchicine and prednisone Call if redness if worsening and not improving  - BMP8+EGFR - Uric acid - colchicine 0.6 MG tablet; Take 2 tabs immediately, then 1 tab twice per day for the duration of the flare up to a max of 7 days  Dispense: 21 tablet; Refill: 0 - predniSONE (DELTASONE) 20 MG tablet; Take 2 tablets (40 mg total) by mouth daily with breakfast for 5 days.  Dispense: 10 tablet; Refill: 0     Lenna Gilford, [place]

## 2021-03-26 LAB — BMP8+EGFR
BUN/Creatinine Ratio: 9 — ABNORMAL LOW (ref 10–24)
BUN: 11 mg/dL (ref 8–27)
CO2: 22 mmol/L (ref 20–29)
Calcium: 9.7 mg/dL (ref 8.6–10.2)
Chloride: 100 mmol/L (ref 96–106)
Creatinine, Ser: 1.21 mg/dL (ref 0.76–1.27)
Glucose: 131 mg/dL — ABNORMAL HIGH (ref 70–99)
Potassium: 4.1 mmol/L (ref 3.5–5.2)
Sodium: 140 mmol/L (ref 134–144)
eGFR: 59 mL/min/{1.73_m2} — ABNORMAL LOW (ref >59)

## 2021-03-26 LAB — URIC ACID: Uric Acid: 8.7 mg/dL — ABNORMAL HIGH (ref 3.8–8.4)

## 2021-03-29 DIAGNOSIS — L570 Actinic keratosis: Secondary | ICD-10-CM | POA: Diagnosis not present

## 2021-03-29 DIAGNOSIS — X32XXXD Exposure to sunlight, subsequent encounter: Secondary | ICD-10-CM | POA: Diagnosis not present

## 2021-04-09 ENCOUNTER — Telehealth: Payer: Self-pay | Admitting: Family Medicine

## 2021-04-12 DIAGNOSIS — L570 Actinic keratosis: Secondary | ICD-10-CM | POA: Diagnosis not present

## 2021-04-12 DIAGNOSIS — X32XXXD Exposure to sunlight, subsequent encounter: Secondary | ICD-10-CM | POA: Diagnosis not present

## 2021-04-15 ENCOUNTER — Ambulatory Visit (INDEPENDENT_AMBULATORY_CARE_PROVIDER_SITE_OTHER): Payer: Medicare Other

## 2021-04-15 ENCOUNTER — Ambulatory Visit (INDEPENDENT_AMBULATORY_CARE_PROVIDER_SITE_OTHER): Payer: Medicare Other | Admitting: Family Medicine

## 2021-04-15 ENCOUNTER — Encounter: Payer: Self-pay | Admitting: Family Medicine

## 2021-04-15 VITALS — BP 182/82 | HR 63 | Ht 71.0 in | Wt 189.0 lb

## 2021-04-15 DIAGNOSIS — S0993XA Unspecified injury of face, initial encounter: Secondary | ICD-10-CM | POA: Diagnosis not present

## 2021-04-15 DIAGNOSIS — W19XXXA Unspecified fall, initial encounter: Secondary | ICD-10-CM

## 2021-04-15 DIAGNOSIS — Z043 Encounter for examination and observation following other accident: Secondary | ICD-10-CM | POA: Diagnosis not present

## 2021-04-15 MED ORDER — TRAMADOL HCL 50 MG PO TABS: 50.0000 mg | ORAL_TABLET | Freq: Three times a day (TID) | ORAL | 0 refills | Status: AC | PRN

## 2021-04-15 NOTE — Progress Notes (Signed)
BP (!) 182/82    Pulse 63    Ht 5\' 11"  (1.803 m)    Wt 189 lb (85.7 kg)    SpO2 97%    BMI 26.36 kg/m    Subjective:   Patient ID: [name], male    DOB: [birth year], 86 y.o.   MRN: [id number]  HPI: Ksean Vale is a 86 y.o. male presenting on 04/15/2021 for Fall (Hit head) and Jaw Pain (Right, swollen)   HPI [place] and hit head Patient said he awoke early this morning with some leg pain and went to get up and his leg gave out on him and he fell down and hit the right side of his face/jaw and chin on a nightstand as he went down.  He says he has significant swelling and pain on that right side of his face.  He says it hurts to open his mouth.  He says he did have his dentures in and he feels like he scraped up the inside of his cheek from his teeth as well.  Patient is not having confusion or acting abnormally.  He says the pain is very severe.  This occurred at 5 AM this morning which is about 7-1/2 hours ago.  Relevant past medical, surgical, family and social history reviewed and updated as indicated. Interim medical history since our last visit reviewed. Allergies and medications reviewed and updated.  Review of Systems  Constitutional:  Negative for chills and fever.  Eyes:  Negative for visual disturbance.  Respiratory:  Negative for shortness of breath and wheezing.   Cardiovascular:  Negative for chest pain and leg swelling.  Musculoskeletal:  Positive for arthralgias. Negative for back pain and gait problem.  Skin:  Positive for color change. Negative for rash.  All other systems reviewed and are negative.  Per HPI unless specifically indicated above   Allergies as of 04/15/2021       Reactions   Lamisil [terbinafine Hcl] Other (See Comments)   Loss of taste         Medication List        Accurate as of April 15, 2021  1:00 PM. If you have any questions, ask your nurse or doctor.          amLODipine 5 MG tablet Commonly known as: NORVASC Take 1.5  tablets (7.5 mg total) by mouth daily.   aspirin 81 MG tablet Take 81 mg by mouth 2 (two) times daily.   cetirizine 10 MG tablet Commonly known as: ZYRTEC Take 1 tablet (10 mg total) by mouth daily.   colchicine 0.6 MG tablet Take 2 tabs immediately, then 1 tab twice per day for the duration of the flare up to a max of 7 days   mirabegron ER 50 MG Tb24 tablet Commonly known as: Myrbetriq Take 1 tablet (50 mg total) by mouth daily.   pantoprazole 40 MG tablet Commonly known as: PROTONIX Take 1 tablet (40 mg total) by mouth daily. TAKE ONE TABLET BY MOUTH EVERY DAY 30-60 MINUTES BEFORE FIRST MEAL OF THE DAY.   QUEtiapine 50 MG tablet Commonly known as: SEROQUEL Take 1 tablet (50 mg total) by mouth at bedtime.   Repatha SureClick 025 MG/ML Soaj Generic drug: Evolocumab INJECT 140 MG INTO THE SKIN EVERY 14 DAYS   Stiolto Respimat 2.5-2.5 MCG/ACT Aers Generic drug: Tiotropium Bromide-Olodaterol Inhale 2 puffs into the lungs daily.   traMADol 50 MG tablet Commonly known as: ULTRAM Take 1 tablet (50 mg total) by mouth  every 8 (eight) hours as needed for up to 7 days. Started by: Fransisca Kaufmann Aalaysia Liggins, MD   valsartan 160 MG tablet Commonly known as: Diovan Take 1 tablet (160 mg total) by mouth 2 (two) times daily.   Vitamin D3 125 MCG (5000 UT) Caps Take 1 capsule (5,000 Units total) by mouth daily.         Objective:   BP (!) 182/82    Pulse 63    Ht 5\' 11"  (1.803 m)    Wt 189 lb (85.7 kg)    SpO2 97%    BMI 26.36 kg/m   Wt Readings from Last 3 Encounters:  04/15/21 189 lb (85.7 kg)  03/25/21 190 lb (86.2 kg)  03/12/21 191 lb (86.6 kg)    Physical Exam Vitals and nursing note reviewed.  Constitutional:      General: He is not in acute distress.    Appearance: Normal appearance. He is not ill-appearing.  HENT:     Head:     Jaw: Tenderness, swelling and pain on movement present.   Neurological:     Mental Status: He is alert.   X-ray face: No acute bony  abnormalities noted, await final read from radiologist.  Assessment & Plan:   Problem List Items Addressed This Visit   None Visit Diagnoses     Fall, initial encounter    -  Primary   Relevant Medications   traMADol (ULTRAM) 50 MG tablet   Other Relevant Orders   DG Facial Bones Complete   Blunt trauma of face, initial encounter       Relevant Medications   traMADol (ULTRAM) 50 MG tablet   Other Relevant Orders   DG Facial Bones Complete       Recommended ice and give a little bit of tramadol to help with the pain and to be cautious. Follow up plan: Return if symptoms worsen or fail to improve.  Counseling provided for all of the vaccine components Orders Placed This Encounter  Procedures   DG Facial Bones Complete    Caryl Pina, MD [facility] 04/15/2021, 1:00 PM

## 2021-05-13 ENCOUNTER — Ambulatory Visit (INDEPENDENT_AMBULATORY_CARE_PROVIDER_SITE_OTHER): Payer: Medicare Other

## 2021-05-13 DIAGNOSIS — R0609 Other forms of dyspnea: Secondary | ICD-10-CM

## 2021-05-13 DIAGNOSIS — Z8546 Personal history of malignant neoplasm of prostate: Secondary | ICD-10-CM

## 2021-05-13 DIAGNOSIS — I10 Essential (primary) hypertension: Secondary | ICD-10-CM

## 2021-05-13 DIAGNOSIS — J449 Chronic obstructive pulmonary disease, unspecified: Secondary | ICD-10-CM

## 2021-05-13 DIAGNOSIS — E785 Hyperlipidemia, unspecified: Secondary | ICD-10-CM

## 2021-05-13 NOTE — Chronic Care Management (AMB) (Signed)
Chronic Care Management    Clinical Social Work Note  05/13/2021 Name: [name] MRN: [id number] DOB: 01/19/1936  [name] is a 86 y.o. year old male who is a primary care patient of Dettinger, Fransisca Kaufmann, MD. The CCM team was consulted to assist the patient with chronic disease management and/or care coordination needs related to: [employer] .   Engaged with patient by telephone for follow up visit in response to provider referral for social work chronic care management and care coordination services.   Consent to Services:  The patient was given information about Chronic Care Management services, agreed to services, and gave verbal consent prior to initiation of services.  Please see initial visit note for detailed documentation.   Patient agreed to services and consent obtained.   Assessment: Review of patient past medical history, allergies, medications, and health status, including review of relevant consultants reports was performed today as part of a comprehensive evaluation and provision of chronic care management and care coordination services.     SDOH (Social Determinants of Health) assessments and interventions performed:  SDOH Interventions    Flowsheet Row Most Recent Value  SDOH Interventions   Stress Interventions Other (Comment)  [client has stress related to managing grief issues faced]  Depression Interventions/Treatment  Medication        Advanced Directives Status: See Vynca application for related entries.  CCM Care Plan  Allergies  Allergen Reactions   Lamisil [Terbinafine Hcl] Other (See Comments)    Loss of taste     Outpatient Encounter Medications as of 05/13/2021  Medication Sig   amLODipine (NORVASC) 5 MG tablet Take 1.5 tablets (7.5 mg total) by mouth daily.   aspirin 81 MG tablet Take 81 mg by mouth 2 (two) times daily.    cetirizine (ZYRTEC) 10 MG tablet Take 1 tablet (10 mg total) by mouth daily.   Cholecalciferol  (VITAMIN D3) 125 MCG (5000 UT) CAPS Take 1 capsule (5,000 Units total) by mouth daily.   colchicine 0.6 MG tablet Take 2 tabs immediately, then 1 tab twice per day for the duration of the flare up to a max of 7 days   Evolocumab (REPATHA SURECLICK) 629 MG/ML SOAJ INJECT 140 MG INTO THE SKIN EVERY 14 DAYS   mirabegron ER (MYRBETRIQ) 50 MG TB24 tablet Take 1 tablet (50 mg total) by mouth daily.   pantoprazole (PROTONIX) 40 MG tablet Take 1 tablet (40 mg total) by mouth daily. TAKE ONE TABLET BY MOUTH EVERY DAY 30-60 MINUTES BEFORE FIRST MEAL OF THE DAY.   QUEtiapine (SEROQUEL) 50 MG tablet Take 1 tablet (50 mg total) by mouth at bedtime.   Tiotropium Bromide-Olodaterol (STIOLTO RESPIMAT) 2.5-2.5 MCG/ACT AERS Inhale 2 puffs into the lungs daily.   valsartan (DIOVAN) 160 MG tablet Take 1 tablet (160 mg total) by mouth 2 (two) times daily.   No facility-administered encounter medications on file as of 05/13/2021.    Patient Active Problem List   Diagnosis Date Noted   PVC's (premature ventricular contractions) 05/03/2017   Overactive bladder 06/08/2015   DOE (dyspnea on exertion) 11/29/2013   Aftercare following surgery of the circulatory system, NEC 01/31/2013   Occlusion and stenosis of carotid artery without mention of cerebral infarction 01/31/2012   History of prostate cancer 08/20/2008   Hyperlipidemia LDL goal <100 08/20/2008   ULNAR NEUROPATHY 08/20/2008   HTN (hypertension), benign 08/20/2008   CAD 08/20/2008   CEREBROVASCULAR DISEASE 08/20/2008   COPD GOLD II 09/24/2007    Conditions to  be addressed/monitored: monitor client management of grief issues  Care Plan : LCSW care plan  Updates made by Katha Cabal, LCSW since 05/13/2021 12:00 AM     Problem: Emotional Distress      Goal: Emotional Health Supported; Manage Grief issues   Start Date: 03/16/2021  Expected End Date: 06/10/2021  This Visit's Progress: On track  Recent Progress: On track  Priority: Medium   Note:    Current Barriers:  Grief issues related to death of his wife nearly 2 years ago Some pain issues when walking  Clinical Social Work Goal(s):  Over the next 30 days, patient will work with SW to discuss depression and depression management for client  Client will attend scheduled medical appointments in next 30 days Client will communicate as needed with RNCM in next 30 days to discuss nursing needs of client Over next 30 days, patient will work with SW to discuss grief issues of client and pain issues of client  Interventions:  1:1 collaboration with Dr. Vonna Kotyk Dettinger regarding development and update of comprehensive plan of care as evidence by provider attestation and co-signature  Discussed CCM services. Client said he had decided to stop participating in CCM services. He asked to be discharged from [facility] . He asked to be discharged from [facility]. Client said he was recovering from his recent fall  Patient Self Care Activities:  Completes ADLs daily as needed Drives to appointments and to complete errands as needed Has support from daughter and from his grandchildren Attends medical appointments as scheduled  Patient Deficits: Some pain issues Grief issues faced  Patient Goals: Attend medical appointments as scheduled for next 30 days Allow time to rest and relax to manage stress issues Socialize via phone or in person as able with family members in next 30 days  Follow Up Plan:  LCSW is discharging client today from [facility] at client request.    [name].[name], [place] [employer] representative [facility] [phone]

## 2021-05-13 NOTE — Patient Instructions (Addendum)
Visit Information  Patient Goals:  Manage Emotions. Manage Grief issues faced. Manage Depression issues  Timeframe:  Short-Term Goal Priority:  Medium Progress: On Track Start Date:       03/16/21                Expected End Date:      06/10/21            Follow Up Date LCSW is discharging client today from CCM services at client request  Manage Emotions; Manage Grief issues faced ; Manage Depression issues   Why is this important?   When you are stressed, down or upset, your body reacts too.  For example, your blood pressure may get higher; you may have a headache or stomachache.  When your emotions get the best of you, your body's ability to fight off cold and flu gets weak.  These steps will help you manage your emotions.    Patient Self Care Activities:  Completes ADLs daily as needed Drives to appointments and to complete errands as needed Has support from daughter and from his grandchildren Attends medical appointments as scheduled  Patient Deficits: Some pain issues Grief issues faced  Patient Goals: Attend medical appointments as scheduled for next 30 days Allow time to rest and relax to manage stress issues Socialize via phone or in person as able with family members in next 30 days  Follow Up Plan:  LCSW is discharging client today from [facility] at client request    [name].Maryan Sivak MSW, [place] [employer] representative [facility] [phone]

## 2021-05-18 DIAGNOSIS — I10 Essential (primary) hypertension: Secondary | ICD-10-CM

## 2021-05-18 DIAGNOSIS — Z8546 Personal history of malignant neoplasm of prostate: Secondary | ICD-10-CM

## 2021-05-18 DIAGNOSIS — J449 Chronic obstructive pulmonary disease, unspecified: Secondary | ICD-10-CM

## 2021-05-18 DIAGNOSIS — E785 Hyperlipidemia, unspecified: Secondary | ICD-10-CM

## 2021-06-03 ENCOUNTER — Other Ambulatory Visit: Payer: Self-pay | Admitting: Family Medicine

## 2021-06-11 ENCOUNTER — Encounter: Payer: Self-pay | Admitting: Family Medicine

## 2021-06-11 ENCOUNTER — Ambulatory Visit (INDEPENDENT_AMBULATORY_CARE_PROVIDER_SITE_OTHER): Payer: Medicare Other | Admitting: Family Medicine

## 2021-06-11 VITALS — BP 151/67 | HR 71 | Ht 71.0 in | Wt 188.0 lb

## 2021-06-11 DIAGNOSIS — J449 Chronic obstructive pulmonary disease, unspecified: Secondary | ICD-10-CM | POA: Diagnosis not present

## 2021-06-11 DIAGNOSIS — N3281 Overactive bladder: Secondary | ICD-10-CM

## 2021-06-11 DIAGNOSIS — E785 Hyperlipidemia, unspecified: Secondary | ICD-10-CM

## 2021-06-11 DIAGNOSIS — I10 Essential (primary) hypertension: Secondary | ICD-10-CM | POA: Diagnosis not present

## 2021-06-11 DIAGNOSIS — Z23 Encounter for immunization: Secondary | ICD-10-CM

## 2021-06-11 DIAGNOSIS — F5104 Psychophysiologic insomnia: Secondary | ICD-10-CM | POA: Diagnosis not present

## 2021-06-11 MED ORDER — AMLODIPINE BESYLATE-VALSARTAN 10-320 MG PO TABS: 1.0000 | ORAL_TABLET | Freq: Every day | ORAL | 3 refills | Status: DC

## 2021-06-11 MED ORDER — QUETIAPINE FUMARATE 50 MG PO TABS: 50.0000 mg | ORAL_TABLET | Freq: Every day | ORAL | 3 refills | Status: DC

## 2021-06-11 MED ORDER — MIRABEGRON ER 50 MG PO TB24: 50.0000 mg | ORAL_TABLET | Freq: Every day | ORAL | 3 refills | Status: DC

## 2021-06-11 MED ORDER — CETIRIZINE HCL 10 MG PO TABS: 10.0000 mg | ORAL_TABLET | Freq: Every day | ORAL | 3 refills | Status: DC

## 2021-06-11 NOTE — Progress Notes (Signed)
? ?BP (!) 151/67   Pulse 71   Ht '5\' 11"'$  (1.803 m)   Wt 188 lb (85.3 kg)   SpO2 96%   BMI 26.22 kg/m?   ? ?Subjective:  ? ?Patient ID: [name], male    DOB: 05/27/1935, 86 y.o.   MRN: [id number] ? ?HPI: ?[name] is a 86 y.o. male presenting on 06/11/2021 for Medical Management of Chronic Issues ? ? ?HPI ?Hypertension ?Patient is currently on amlodipine and valsartan, and their blood pressure today is 151/61 and if it is consistently running in the 150s at home. Patient denies any lightheadedness or dizziness. Patient denies headaches, blurred vision, chest pains, shortness of breath, or weakness. Denies any side effects from medication and is content with current medication.  ? ?Hyperlipidemia and CAD ?Patient is coming in for recheck of his hyperlipidemia. The patient is currently taking Repatha. They deny any issues with myalgias or history of liver damage from it. They deny any focal numbness or weakness or chest pain.  ? ?COPD ?Patient is coming in for COPD recheck today.  He is currently on Stiolto.  He has a mild chronic cough but denies any major coughing spells or wheezing spells.  He has 0nighttime symptoms per week and 0daytime symptoms per week currently.  ? ?Insomnia and anxiety recheck ?Patient currently takes Seroquel for insomnia and anxiety.  He feels like it is working very well for him and sleep is going well and he says Seroquel is really good.  He wants to continue forward with it. ? ?Relevant past medical, surgical, family and social history reviewed and updated as indicated. Interim medical history since our last visit reviewed. ?Allergies and medications reviewed and updated. ? ?Review of Systems  ?Constitutional:  Negative for chills and fever.  ?Eyes:  Negative for visual disturbance.  ?Respiratory:  Negative for shortness of breath and wheezing.   ?Cardiovascular:  Negative for chest pain and leg swelling.  ?Musculoskeletal:  Negative for back pain and gait problem.   ?Skin:  Negative for rash.  ?Neurological:  Negative for dizziness, weakness and light-headedness.  ?All other systems reviewed and are negative. ? ?Per HPI unless specifically indicated above ? ? ?Allergies as of 06/11/2021   ? ?   Reactions  ? Lamisil [terbinafine Hcl] Other (See Comments)  ? Loss of taste   ? ?  ? ?  ?Medication List  ?  ? ?  ? Accurate as of June 11, 2021  8:39 AM. If you have any questions, ask your nurse or doctor.  ?  ?  ? ?  ? ?STOP taking these medications   ? ?amLODipine 5 MG tablet ?Commonly known as: NORVASC ?Stopped by: Fransisca Kaufmann Mairi Stagliano, MD ?  ?valsartan 160 MG tablet ?Commonly known as: Diovan ?Stopped by: Worthy Rancher, MD ?  ? ?  ? ?TAKE these medications   ? ?amLODipine-valsartan 10-320 MG tablet ?Commonly known as: EXFORGE ?Take 1 tablet by mouth daily. ?Started by: Worthy Rancher, MD ?  ?aspirin 81 MG tablet ?Take 81 mg by mouth 2 (two) times daily. ?  ?cetirizine 10 MG tablet ?Commonly known as: ZYRTEC ?Take 1 tablet (10 mg total) by mouth daily. ?  ?colchicine 0.6 MG tablet ?Take 2 tabs immediately, then 1 tab twice per day for the duration of the flare up to a max of 7 days ?  ?IRON COMPLEX PO ?Take 60 mg by mouth daily. ?  ?mirabegron ER 50 MG Tb24 tablet ?Commonly known as: Myrbetriq ?Take  1 tablet (50 mg total) by mouth daily. ?What changed: how much to take ?Changed by: Worthy Rancher, MD ?  ?pantoprazole 40 MG tablet ?Commonly known as: PROTONIX ?Take 1 tablet (40 mg total) by mouth daily. TAKE ONE TABLET BY MOUTH EVERY DAY 30-60 MINUTES BEFORE FIRST MEAL OF THE DAY. ?  ?QUEtiapine 50 MG tablet ?Commonly known as: SEROQUEL ?Take 1 tablet (50 mg total) by mouth at bedtime. ?  ?Repatha SureClick 557 MG/ML Soaj ?Generic drug: Evolocumab ?INJECT 140 MG INTO THE SKIN EVERY 14 DAYS ?  ?Stiolto Respimat 2.5-2.5 MCG/ACT Aers ?Generic drug: Tiotropium Bromide-Olodaterol ?Inhale 2 puffs into the lungs daily. ?  ?Vitamin D3 125 MCG (5000 UT) Caps ?Take 1 capsule  (5,000 Units total) by mouth daily. ?  ? ?  ? ? ? ?Objective:  ? ?BP (!) 151/67   Pulse 71   Ht '5\' 11"'$  (1.803 m)   Wt 188 lb (85.3 kg)   SpO2 96%   BMI 26.22 kg/m?   ?Wt Readings from Last 3 Encounters:  ?06/11/21 188 lb (85.3 kg)  ?04/15/21 189 lb (85.7 kg)  ?03/25/21 190 lb (86.2 kg)  ?  ?Physical Exam ?Vitals and nursing note reviewed.  ?Constitutional:   ?   General: He is not in acute distress. ?   Appearance: He is well-developed. He is not diaphoretic.  ?Eyes:  ?   General: No scleral icterus. ?   Conjunctiva/sclera: Conjunctivae normal.  ?Neck:  ?   Thyroid: No thyromegaly.  ?Cardiovascular:  ?   Rate and Rhythm: Normal rate and regular rhythm.  ?   Heart sounds: Normal heart sounds. No murmur heard. ?Pulmonary:  ?   Effort: Pulmonary effort is normal. No respiratory distress.  ?   Breath sounds: Normal breath sounds. No wheezing.  ?Musculoskeletal:     ?   General: Normal range of motion.  ?   Cervical back: Neck supple.  ?Lymphadenopathy:  ?   Cervical: No cervical adenopathy.  ?Skin: ?   General: Skin is warm and dry.  ?   Findings: No rash.  ?Neurological:  ?   Mental Status: He is alert and oriented to person, place, and time.  ?   Coordination: Coordination normal.  ?Psychiatric:     ?   Behavior: Behavior normal.  ? ? ? ? ?Assessment & Plan:  ? ?Problem List Items Addressed This Visit   ? ?  ? Cardiovascular and Mediastinum  ? HTN (hypertension), benign - Primary  ? Relevant Medications  ? amLODipine-valsartan (EXFORGE) 10-320 MG tablet  ?  ? Respiratory  ? COPD GOLD II  ? Relevant Medications  ? cetirizine (ZYRTEC) 10 MG tablet  ?  ? Genitourinary  ? Overactive bladder  ? Relevant Medications  ? mirabegron ER (MYRBETRIQ) 50 MG TB24 tablet  ?  ? Other  ? Hyperlipidemia LDL goal <100  ? Relevant Medications  ? amLODipine-valsartan (EXFORGE) 10-320 MG tablet  ? Psychophysiological insomnia  ? Relevant Medications  ? QUEtiapine (SEROQUEL) 50 MG tablet  ?  ?Continue current medicine, seems to be  doing well. ?Follow up plan: ?Return in about 3 months (around 09/11/2021), or if symptoms worsen or fail to improve, for Hypertension and hyperlipidemia and insomnia recheck. ? ?Counseling provided for all of the vaccine components ?No orders of the defined types were placed in this encounter. ? ? ?Caryl Pina, MD ?[facility] ?06/11/2021, 8:39 AM ? ? ?  ? ?

## 2021-06-11 NOTE — Addendum Note (Signed)
Addended by: Alphonzo Dublin on: 06/11/2021 11:48 AM ? ? Modules accepted: Orders ? ?

## 2021-06-30 ENCOUNTER — Other Ambulatory Visit: Payer: Self-pay | Admitting: Cardiology

## 2021-07-05 ENCOUNTER — Ambulatory Visit (INDEPENDENT_AMBULATORY_CARE_PROVIDER_SITE_OTHER): Payer: Medicare Other | Admitting: Gastroenterology

## 2021-07-05 ENCOUNTER — Encounter: Payer: Self-pay | Admitting: Gastroenterology

## 2021-07-05 VITALS — BP 140/80 | HR 51 | Ht 70.0 in | Wt 189.0 lb

## 2021-07-05 DIAGNOSIS — Z86010 Personal history of colonic polyps: Secondary | ICD-10-CM | POA: Diagnosis not present

## 2021-07-05 DIAGNOSIS — Z800 Family history of malignant neoplasm of digestive organs: Secondary | ICD-10-CM

## 2021-07-05 MED ORDER — PLENVU 140 G PO SOLR: 1.0000 | Freq: Once | ORAL | 0 refills | Status: AC

## 2021-07-05 NOTE — Patient Instructions (Signed)
You have been scheduled for a colonoscopy. Please follow written instructions given to you at your visit today.  Please pick up your prep supplies at the pharmacy within the next 1-3 days. If you use inhalers (even only as needed), please bring them with you on the day of your procedure.  The [facility] providers would like to encourage you to use MYCHART to communicate with providers for non-urgent requests or questions.  Due to long hold times on the telephone, sending your provider a message by MYCHART may be a faster and more efficient way to get a response.  Please allow 48 business hours for a response.  Please remember that this is for non-urgent requests.   Due to recent changes in healthcare laws, you may see the results of your imaging and laboratory studies on MyChart before your provider has had a chance to review them.  We understand that in some cases there may be results that are confusing or concerning to you. Not all laboratory results come back in the same time frame and the provider may be waiting for multiple results in order to interpret others.  Please give us 48 hours in order for your provider to thoroughly review all the results before contacting the office for clarification of your results.   Thank you for choosing me and [facility].  Malcolm T. Stark, Jr., MD., FACG  

## 2021-07-05 NOTE — Progress Notes (Signed)
? ? ?History of Present Illness: This is an 86 year old male referred by Dettinger, Fransisca Kaufmann, MD for the evaluation of a personal history of adenomatous colon polyps and a family history of colon cancer.  His last colonoscopy as below.  His mother developed colon cancer at age 12.  He has no ongoing gastrointestinal complaints.  Given his personal and family history he is very concerned about his colon polyp and colon cancer risk.  His overall health is very good.  He is maintained on ASA 81 mg twice daily. Denies weight loss, abdominal pain, constipation, diarrhea, change in stool caliber, melena, hematochezia, nausea, vomiting, dysphagia, reflux symptoms, chest pain. ? ? ?Colonoscopy 06/2017 ?- A single non-bleeding colonic angiodysplastic lesion. ?- Three 6 to 7 mm polyps in the rectum, in the transverse colon and at the hepatic flexure, ?removed with a cold snare. Resected and retrieved. ?- Internal hemorrhoids. ?- The examination was otherwise normal on direct and retroflexion views. ? ? ?Allergies  ?Allergen Reactions  ? Lamisil [Terbinafine Hcl] Other (See Comments)  ?  Loss of taste   ? ?Outpatient Medications Prior to Visit  ?Medication Sig Dispense Refill  ? amLODipine-valsartan (EXFORGE) 10-320 MG tablet Take 1 tablet by mouth daily. 90 tablet 3  ? aspirin 81 MG tablet Take 81 mg by mouth 2 (two) times daily.     ? cetirizine (ZYRTEC) 10 MG tablet Take 1 tablet (10 mg total) by mouth daily. 90 tablet 3  ? Cholecalciferol (VITAMIN D3) 125 MCG (5000 UT) CAPS Take 1 capsule (5,000 Units total) by mouth daily. 90 capsule 3  ? colchicine 0.6 MG tablet Take 2 tabs immediately, then 1 tab twice per day for the duration of the flare up to a max of 7 days 21 tablet 0  ? Evolocumab (REPATHA SURECLICK) 814 MG/ML SOAJ INJECT 140 MG INTO THE SKIN EVERY 14 DAYS 6 mL 1  ? Iron Combinations (IRON COMPLEX PO) Take 60 mg by mouth daily.    ? mirabegron ER (MYRBETRIQ) 50 MG TB24 tablet Take 1 tablet (50 mg total) by mouth  daily. 90 tablet 3  ? pantoprazole (PROTONIX) 40 MG tablet Take 1 tablet (40 mg total) by mouth daily. TAKE ONE TABLET BY MOUTH EVERY DAY 30-60 MINUTES BEFORE FIRST MEAL OF THE DAY. 90 tablet 3  ? QUEtiapine (SEROQUEL) 50 MG tablet Take 1 tablet (50 mg total) by mouth at bedtime. 90 tablet 3  ? Tiotropium Bromide-Olodaterol (STIOLTO RESPIMAT) 2.5-2.5 MCG/ACT AERS Inhale 2 puffs into the lungs daily. 12 g 3  ? ?No facility-administered medications prior to visit.  ? ?Past Medical History:  ?Diagnosis Date  ? Adenomatous colon polyp 11/1999  ? Allergy   ? Blood transfusion without reported diagnosis   ? Cancer [facility])   ? Prostate  ? Carotid artery occlusion   ? Cataract   ? COPD (chronic obstructive pulmonary disease) ([place])   ? GERD (gastroesophageal reflux disease)   ? Hyperlipidemia   ? Hypertension   ? Internal hemorrhoids   ? PVC (premature ventricular contraction)   ? ?Past Surgical History:  ?Procedure Laterality Date  ? APPENDECTOMY    ? carotid surgery    ? CATARACT EXTRACTION    ? both eyes  ? COLONOSCOPY    ? POLYPECTOMY    ? TRANSURETHRAL RESECTION OF PROSTATE    ? UPPER GASTROINTESTINAL ENDOSCOPY    ? UPPER GI ENDOSCOPY  Nov. 3, 2014  ? Upper endoscopy with diatation  ? ?Social History  ? ?Socioeconomic  History  ? Marital status: Widowed  ?  Spouse name: Not on file  ? Number of children: 2  ? Years of education: some college  ? Highest education level: 12th grade  ?Occupational History  ? Not on file  ?Tobacco Use  ? Smoking status: Former  ?  Packs/day: 1.50  ?  Years: 40.00  ?  Pack years: 60.00  ?  Types: Cigarettes  ?  Quit date: 01/19/1997  ?  Years since quitting: 24.4  ? Smokeless tobacco: Never  ?Vaping Use  ? Vaping Use: Never used  ?Substance and Sexual Activity  ? Alcohol use: No  ? Drug use: No  ? Sexual activity: Not on file  ?Other Topics Concern  ? Not on file  ?Social History Narrative  ? Widowed. Lives alone.   ? ?Social Determinants of Health  ? ?Financial Resource Strain: Low Risk   ?  Difficulty of Paying Living Expenses: Not hard at all  ?Food Insecurity: No Food Insecurity  ? Worried About Charity fundraiser in the Last Year: Never true  ? Ran Out of Food in the Last Year: Never true  ?Transportation Needs: No Transportation Needs  ? Lack of Transportation (Medical): No  ? Lack of Transportation (Non-Medical): No  ?Physical Activity: Insufficiently Active  ? Days of Exercise per Week: 1 day  ? Minutes of Exercise per Session: 10 min  ?Stress: Stress Concern Present  ? Feeling of Stress : To some extent  ?Social Connections: Moderately Integrated  ? Frequency of Communication with Friends and Family: More than three times a week  ? Frequency of Social Gatherings with Friends and Family: More than three times a week  ? Attends Religious Services: More than 4 times per year  ? Active Member of Clubs or Organizations: Yes  ? Attends Archivist Meetings: More than 4 times per year  ? Marital Status: Widowed  ? ?Family History  ?Problem Relation Age of Onset  ? Colon cancer Mother   ? Cancer Mother   ?     colon  ? Heart disease Father   ?     Heart Disease before age 68  ? Heart attack Father 58  ? Arthritis Sister   ? Esophageal cancer Neg Hx   ? Rectal cancer Neg Hx   ? Stomach cancer Neg Hx   ? Colon polyps Neg Hx   ? ?   ? ?Review of Systems: Pertinent positive and negative review of systems were noted in the above HPI section. All other review of systems were otherwise negative. ? ? ?Physical Exam: ?General: Well developed, well nourished, no acute distress ?Head: Normocephalic and atraumatic ?Eyes: Sclerae anicteric, EOMI ?Ears: Normal auditory acuity ?Mouth: Not examined, mask on during Covid-19 pandemic ?Neck: Supple, no masses or thyromegaly ?Lungs: Clear throughout to auscultation ?Heart: Regular rate and rhythm; no murmurs, rubs or bruits ?Abdomen: Soft, non tender and non distended. No masses, hepatosplenomegaly or hernias noted. Normal Bowel sounds ?Rectal: Deferred to  colonoscopy ?Musculoskeletal: Symmetrical with no gross deformities  ?Skin: No lesions on visible extremities ?Pulses:  Normal pulses noted ?Extremities: No clubbing, cyanosis, edema or deformities noted ?Neurological: Alert oriented x 4, grossly nonfocal ?Cervical Nodes:  No significant cervical adenopathy ?Inguinal Nodes: No significant inguinal adenopathy ?Psychological:  Alert and cooperative. Normal mood and affect ? ? ?Assessment and Recommendations: ? ?Personal history of adenomatous colon polyps and family history of colon cancer, first-degree relative at age 31.  We discussed the risks  and benefits to continuing surveillance colonoscopy.  He is aware that most people his age discontinue colonoscopy which is a reasonable option for him however since his health is very good he is very interested in proceeding with 1 more colonoscopy.  Schedule colonoscopy. The risks (including bleeding, perforation, infection, missed lesions, medication reactions and possible hospitalization or surgery if complications occur), benefits, and alternatives to colonoscopy with possible biopsy and possible polypectomy were discussed with the patient and they consent to proceed.   ?Colonic AVM.  ? ? ?cc: Dettinger, Fransisca Kaufmann, MD ?[address] ?[place],   [address] ?

## 2021-07-29 ENCOUNTER — Ambulatory Visit (INDEPENDENT_AMBULATORY_CARE_PROVIDER_SITE_OTHER): Payer: Medicare Other

## 2021-07-29 VITALS — Wt 189.0 lb

## 2021-07-29 DIAGNOSIS — Z0000 Encounter for general adult medical examination without abnormal findings: Secondary | ICD-10-CM

## 2021-07-29 NOTE — Patient Instructions (Signed)
[name] , ?Thank you for taking time to come for your Medicare Wellness Visit. I appreciate your ongoing commitment to your health goals. Please review the following plan we discussed and let me know if I can assist you in the future.  ? ?Screening recommendations/referrals: ?Colonoscopy: Done 07/05/2017 - Appointment with Dr Derrill Memo for repeat 08/09/21 ?Recommended yearly ophthalmology/optometry visit for glaucoma screening and checkup ?Recommended yearly dental visit for hygiene and checkup ? ?Vaccinations: ?Influenza vaccine: Done 12/14/2020 - Repeat annually ?Pneumococcal vaccine: Done 03/05/2020 & 06/11/2021 ?Tdap vaccine: Done 12/05/2019 - Repeat in 10 years ?Shingles vaccine: Done 06/11/2021 - get second dose at next visit   ?Covid-19:  Done 04/01/2019, 04/29/2019, 01/15/2020, & 09/29/2020 ? ?Advanced directives: Please bring a copy of your health care power of attorney and living will to the office to be added to your chart at your convenience.  ? ?Conditions/risks identified: Aim for 30 minutes of exercise or brisk walking, 6-8 glasses of water, and 5 servings of fruits and vegetables each day.  ? ?Next appointment: Follow up in one year for your annual wellness visit.  ? ?Preventive Care 15 Years and Older, Male ? ?Preventive care refers to lifestyle choices and visits with your health care provider that can promote health and wellness. ?What does preventive care include? ?A yearly physical exam. This is also called an annual well check. ?Dental exams once or twice a year. ?Routine eye exams. Ask your health care provider how often you should have your eyes checked. ?Personal lifestyle choices, including: ?Daily care of your teeth and gums. ?Regular physical activity. ?Eating a healthy diet. ?Avoiding tobacco and drug use. ?Limiting alcohol use. ?Practicing safe sex. ?Taking low doses of aspirin every day. ?Taking vitamin and mineral supplements as recommended by your health care provider. ?What happens during an  annual well check? ?The services and screenings done by your health care provider during your annual well check will depend on your age, overall health, lifestyle risk factors, and family history of disease. ?Counseling  ?Your health care provider may ask you questions about your: ?Alcohol use. ?Tobacco use. ?Drug use. ?Emotional well-being. ?Home and relationship well-being. ?Sexual activity. ?Eating habits. ?History of falls. ?Memory and ability to understand (cognition). ?Work and work Statistician. ?Screening  ?You may have the following tests or measurements: ?Height, weight, and BMI. ?Blood pressure. ?Lipid and cholesterol levels. These may be checked every 5 years, or more frequently if you are over 62 years old. ?Skin check. ?Lung cancer screening. You may have this screening every year starting at age 45 if you have a 30-pack-year history of smoking and currently smoke or have quit within the past 15 years. ?Fecal occult blood test (FOBT) of the stool. You may have this test every year starting at age 39. ?Flexible sigmoidoscopy or colonoscopy. You may have a sigmoidoscopy every 5 years or a colonoscopy every 10 years starting at age 42. ?Prostate cancer screening. Recommendations will vary depending on your family history and other risks. ?Hepatitis C blood test. ?Hepatitis B blood test. ?Sexually transmitted disease (STD) testing. ?Diabetes screening. This is done by checking your blood sugar (glucose) after you have not eaten for a while (fasting). You may have this done every 1-3 years. ?Abdominal aortic aneurysm (AAA) screening. You may need this if you are a current or former smoker. ?Osteoporosis. You may be screened starting at age 81 if you are at high risk. ?Talk with your health care provider about your test results, treatment options, and if necessary,  the need for more tests. ?Vaccines  ?Your health care provider may recommend certain vaccines, such as: ?Influenza vaccine. This is recommended  every year. ?Tetanus, diphtheria, and acellular pertussis (Tdap, Td) vaccine. You may need a Td booster every 10 years. ?Zoster vaccine. You may need this after age 73. ?Pneumococcal 13-valent conjugate (PCV13) vaccine. One dose is recommended after age 56. ?Pneumococcal polysaccharide (PPSV23) vaccine. One dose is recommended after age 14. ?Talk to your health care provider about which screenings and vaccines you need and how often you need them. ?This information is not intended to replace advice given to you by your health care provider. Make sure you discuss any questions you have with your health care provider. ?Document Released: 04/03/2015 Document Revised: 11/25/2015 Document Reviewed: 01/06/2015 ?Elsevier Interactive Patient Education ? 2017 [place]. ? ?Fall Prevention in the Home ?Falls can cause injuries. They can happen to people of all ages. There are many things you can do to make your home safe and to help prevent falls. ?What can I do on the outside of my home? ?Regularly fix the edges of walkways and driveways and fix any cracks. ?Remove anything that might make you trip as you walk through a door, such as a raised step or threshold. ?Trim any bushes or trees on the path to your home. ?Use bright outdoor lighting. ?Clear any walking paths of anything that might make someone trip, such as rocks or tools. ?Regularly check to see if handrails are loose or broken. Make sure that both sides of any steps have handrails. ?Any raised decks and porches should have guardrails on the edges. ?Have any leaves, snow, or ice cleared regularly. ?Use sand or salt on walking paths during winter. ?Clean up any spills in your garage right away. This includes oil or grease spills. ?What can I do in the bathroom? ?Use night lights. ?Install grab bars by the toilet and in the tub and shower. Do not use towel bars as grab bars. ?Use non-skid mats or decals in the tub or shower. ?If you need to sit down in the shower,  use a plastic, non-slip stool. ?Keep the floor dry. Clean up any water that spills on the floor as soon as it happens. ?Remove soap buildup in the tub or shower regularly. ?Attach bath mats securely with double-sided non-slip rug tape. ?Do not have throw rugs and other things on the floor that can make you trip. ?What can I do in the bedroom? ?Use night lights. ?Make sure that you have a light by your bed that is easy to reach. ?Do not use any sheets or blankets that are too big for your bed. They should not hang down onto the floor. ?Have a firm chair that has side arms. You can use this for support while you get dressed. ?Do not have throw rugs and other things on the floor that can make you trip. ?What can I do in the kitchen? ?Clean up any spills right away. ?Avoid walking on wet floors. ?Keep items that you use a lot in easy-to-reach places. ?If you need to reach something above you, use a strong step stool that has a grab bar. ?Keep electrical cords out of the way. ?Do not use floor polish or wax that makes floors slippery. If you must use wax, use non-skid floor wax. ?Do not have throw rugs and other things on the floor that can make you trip. ?What can I do with my stairs? ?Do not leave any items on  the stairs. ?Make sure that there are handrails on both sides of the stairs and use them. Fix handrails that are broken or loose. Make sure that handrails are as long as the stairways. ?Check any carpeting to make sure that it is firmly attached to the stairs. Fix any carpet that is loose or worn. ?Avoid having throw rugs at the top or bottom of the stairs. If you do have throw rugs, attach them to the floor with carpet tape. ?Make sure that you have a light switch at the top of the stairs and the bottom of the stairs. If you do not have them, ask someone to add them for you. ?What else can I do to help prevent falls? ?Wear shoes that: ?Do not have high heels. ?Have rubber bottoms. ?Are comfortable and fit you  well. ?Are closed at the toe. Do not wear sandals. ?If you use a stepladder: ?Make sure that it is fully opened. Do not climb a closed stepladder. ?Make sure that both sides of the stepladder are locked

## 2021-07-29 NOTE — Progress Notes (Signed)
? ?Subjective:  ? [name] is a 86 y.o. male who presents for Medicare Annual/Subsequent preventive examination. ? ?Virtual Visit via Telephone Note ? ?I connected with  [name] on 07/29/21 at 10:30 AM EDT by telephone and verified that I am speaking with the correct person using two identifiers. ? ?Location: ?Patient: Home ?Provider: [facility] ?Persons participating in the virtual visit: patient/Nurse Health Advisor ?  ?I discussed the limitations, risks, security and privacy concerns of performing an evaluation and management service by telephone and the availability of in person appointments. The patient expressed understanding and agreed to proceed. ? ?Interactive audio and video telecommunications were attempted between this nurse and patient, however failed, due to patient having technical difficulties OR patient did not have access to video capability.  We continued and completed visit with audio only. ? ?Some vital signs may be absent or patient reported.  ? ?Anthony Discher Dionne Ano, LPN  ? ?Review of Systems    ? ?Cardiac Risk Factors include: advanced age (>53mn, >>46women);male gender;sedentary lifestyle;dyslipidemia;hypertension;Other (see comment), Risk factor comments: CAD, COPD, hx of CVA ? ?   ?Objective:  ?  ?Today's Vitals  ? 07/29/21 1030 07/29/21 1031  ?Weight: 189 lb (85.7 kg)   ?PainSc:  5   ? ?Body mass index is 27.12 kg/m?. ? ? ?  07/29/2021  ? 10:38 AM 09/19/2020  ?  1:42 PM 05/29/2019  ?  9:40 AM 07/05/2017  ?  9:14 AM 04/26/2017  ? 12:57 PM 01/24/2017  ?  3:25 PM 02/20/2015  ?  2:51 PM  ?Advanced Directives  ?Does Patient Have a Medical Advance Directive? Yes No Yes No Yes Yes No  ?Type of [place]  Living will;Out of facility DNR (pink MOST or yellow form)   [place]   ?Does patient want to make changes to medical advance directive?   No - Patient declined   No - Patient declined   ?Copy of [place] Chart? No - copy requested     No - copy requested   ?Would patient like information on creating a medical advance directive?  No - Patient declined       ? ? ?Current Medications (verified) ?Outpatient Encounter Medications as of 07/29/2021  ?Medication Sig  ? amLODipine-valsartan (EXFORGE) 10-320 MG tablet Take 1 tablet by mouth daily.  ? aspirin 81 MG tablet Take 81 mg by mouth 2 (two) times daily.   ? cetirizine (ZYRTEC) 10 MG tablet Take 1 tablet (10 mg total) by mouth daily.  ? Cholecalciferol (VITAMIN D3) 125 MCG (5000 UT) CAPS Take 1 capsule (5,000 Units total) by mouth daily.  ? Evolocumab (REPATHA SURECLICK) 1937MG/ML SOAJ INJECT 140 MG INTO THE SKIN EVERY 14 DAYS  ? Iron Combinations (IRON COMPLEX PO) Take 60 mg by mouth daily.  ? mirabegron ER (MYRBETRIQ) 50 MG TB24 tablet Take 1 tablet (50 mg total) by mouth daily.  ? pantoprazole (PROTONIX) 40 MG tablet Take 1 tablet (40 mg total) by mouth daily. TAKE ONE TABLET BY MOUTH EVERY DAY 30-60 MINUTES BEFORE FIRST MEAL OF THE DAY.  ? QUEtiapine (SEROQUEL) 50 MG tablet Take 1 tablet (50 mg total) by mouth at bedtime.  ? Tiotropium Bromide-Olodaterol (STIOLTO RESPIMAT) 2.5-2.5 MCG/ACT AERS Inhale 2 puffs into the lungs daily.  ? colchicine 0.6 MG tablet Take 2 tabs immediately, then 1 tab twice per day for the duration of the flare up to a max of  7 days (Patient not taking: Reported on 07/29/2021)  ? ?No facility-administered encounter medications on file as of 07/29/2021.  ? ? ?Allergies (verified) ?Lamisil [terbinafine hcl]  ? ?History: ?Past Medical History:  ?Diagnosis Date  ? Adenomatous colon polyp 11/1999  ? Allergy   ? Blood transfusion without reported diagnosis   ? Cancer [facility])   ? Prostate  ? Carotid artery occlusion   ? Cataract   ? COPD (chronic obstructive pulmonary disease) ([place])   ? GERD (gastroesophageal reflux disease)   ? Hyperlipidemia   ? Hypertension   ? Internal hemorrhoids   ? PVC (premature ventricular contraction)   ? ?Past  Surgical History:  ?Procedure Laterality Date  ? APPENDECTOMY    ? carotid surgery    ? CATARACT EXTRACTION    ? both eyes  ? COLONOSCOPY    ? POLYPECTOMY    ? TRANSURETHRAL RESECTION OF PROSTATE    ? UPPER GASTROINTESTINAL ENDOSCOPY    ? UPPER GI ENDOSCOPY  Nov. 3, 2014  ? Upper endoscopy with diatation  ? ?Family History  ?Problem Relation Age of Onset  ? Colon cancer Mother   ? Cancer Mother   ?     colon  ? Heart disease Father   ?     Heart Disease before age 57  ? Heart attack Father 27  ? Arthritis Sister   ? Esophageal cancer Neg Hx   ? Rectal cancer Neg Hx   ? Stomach cancer Neg Hx   ? Colon polyps Neg Hx   ? ?Social History  ? ?Socioeconomic History  ? Marital status: Widowed  ?  Spouse name: Not on file  ? Number of children: 2  ? Years of education: some college  ? Highest education level: 12th grade  ?Occupational History  ? Occupation: retired  ?Tobacco Use  ? Smoking status: Former  ?  Packs/day: 1.50  ?  Years: 40.00  ?  Pack years: 60.00  ?  Types: Cigarettes  ?  Quit date: 01/19/1997  ?  Years since quitting: 24.5  ? Smokeless tobacco: Never  ?Vaping Use  ? Vaping Use: Never used  ?Substance and Sexual Activity  ? Alcohol use: No  ? Drug use: No  ? Sexual activity: Not on file  ?Other Topics Concern  ? Not on file  ?Social History Narrative  ? Widowed. Lives alone.   ? ?Social Determinants of Health  ? ?Financial Resource Strain: Low Risk   ? Difficulty of Paying Living Expenses: Not hard at all  ?Food Insecurity: No Food Insecurity  ? Worried About Charity fundraiser in the Last Year: Never true  ? Ran Out of Food in the Last Year: Never true  ?Transportation Needs: No Transportation Needs  ? Lack of Transportation (Medical): No  ? Lack of Transportation (Non-Medical): No  ?Physical Activity: Insufficiently Active  ? Days of Exercise per Week: 7 days  ? Minutes of Exercise per Session: 10 min  ?Stress: No Stress Concern Present  ? Feeling of Stress : Not at all  ?Social Connections: Moderately  Integrated  ? Frequency of Communication with Friends and Family: More than three times a week  ? Frequency of Social Gatherings with Friends and Family: More than three times a week  ? Attends Religious Services: More than 4 times per year  ? Active Member of Clubs or Organizations: Yes  ? Attends Archivist Meetings: More than 4 times per year  ? Marital Status: Widowed  ? ? ?  Tobacco Counseling ?Counseling given: Not Answered ? ? ?Clinical Intake: ? ?Pre-visit preparation completed: Yes ? ?Pain : 0-10 ?Pain Score: 5  ?Pain Type: Chronic pain ?Pain Location: Back ?Pain Descriptors / Indicators: Aching, Discomfort ?Pain Onset: More than a month ago ?Pain Frequency: Intermittent ? ?  ? ?BMI - recorded: 27.12 ?Nutritional Status: BMI 25 -29 Overweight ?Nutritional Risks: None ?Diabetes: No ? ?How often do you need to have someone help you when you read instructions, pamphlets, or other written materials from your doctor or pharmacy?: 1 - Never ? ?Diabetic? no ? ?Interpreter Needed?: No ? ?Information entered by :: Sabrie Moritz, LPN ? ? ?Activities of Daily Living ? ?  07/29/2021  ? 10:38 AM  ?In your present state of health, do you have any difficulty performing the following activities:  ?Hearing? 1  ?Vision? 0  ?Difficulty concentrating or making decisions? 0  ?Walking or climbing stairs? 1  ?Comment He can do this if only 5-6  ?Dressing or bathing? 0  ?Doing errands, shopping? 0  ?Preparing Food and eating ? N  ?Using the Toilet? N  ?In the past six months, have you accidently leaked urine? Y  ?Comment wears pads for protection and takes meds - since prostate surgery  ?Do you have problems with loss of bowel control? N  ?Managing your Medications? N  ?Managing your Finances? N  ?Housekeeping or managing your Housekeeping? N  ? ? ?Patient Care Team: ?Dettinger, Fransisca Kaufmann, MD as PCP - General (Family Medicine) ?Minus Breeding, MD as Consulting Physician (Cardiology) ?Tanda Rockers, MD as Consulting  Physician (Pulmonary Disease) ?Allyn Kenner, MD (Dermatology) ?Ladene Artist, MD as Consulting Physician (Gastroenterology) ? ?Indicate any recent Medical Services you may have received from other than [facility] providers

## 2021-08-01 ENCOUNTER — Encounter: Payer: Self-pay | Admitting: Certified Registered Nurse Anesthetist

## 2021-08-06 ENCOUNTER — Encounter: Payer: Self-pay | Admitting: Certified Registered Nurse Anesthetist

## 2021-08-09 ENCOUNTER — Encounter: Payer: Self-pay | Admitting: Gastroenterology

## 2021-08-09 ENCOUNTER — Ambulatory Visit (AMBULATORY_SURGERY_CENTER): Payer: Medicare Other | Admitting: Gastroenterology

## 2021-08-09 VITALS — BP 115/93 | HR 67 | Temp 96.2°F | Resp 11

## 2021-08-09 DIAGNOSIS — I2510 Atherosclerotic heart disease of native coronary artery without angina pectoris: Secondary | ICD-10-CM | POA: Diagnosis not present

## 2021-08-09 DIAGNOSIS — D125 Benign neoplasm of sigmoid colon: Secondary | ICD-10-CM | POA: Diagnosis not present

## 2021-08-09 DIAGNOSIS — Z800 Family history of malignant neoplasm of digestive organs: Secondary | ICD-10-CM | POA: Diagnosis not present

## 2021-08-09 DIAGNOSIS — D123 Benign neoplasm of transverse colon: Secondary | ICD-10-CM

## 2021-08-09 DIAGNOSIS — D122 Benign neoplasm of ascending colon: Secondary | ICD-10-CM

## 2021-08-09 DIAGNOSIS — D124 Benign neoplasm of descending colon: Secondary | ICD-10-CM

## 2021-08-09 DIAGNOSIS — J449 Chronic obstructive pulmonary disease, unspecified: Secondary | ICD-10-CM | POA: Diagnosis not present

## 2021-08-09 DIAGNOSIS — Z86010 Personal history of colonic polyps: Secondary | ICD-10-CM | POA: Diagnosis not present

## 2021-08-09 DIAGNOSIS — D120 Benign neoplasm of cecum: Secondary | ICD-10-CM

## 2021-08-09 MED ORDER — SODIUM CHLORIDE 0.9 % IV SOLN: 500.0000 mL | Freq: Once | INTRAVENOUS | Status: DC

## 2021-08-09 NOTE — Progress Notes (Signed)
Courtney vitals.

## 2021-08-09 NOTE — Progress Notes (Signed)
Called to room to assist during endoscopic procedure.  Patient ID and intended procedure confirmed with present staff. Received instructions for my participation in the procedure from the performing physician.  

## 2021-08-09 NOTE — Patient Instructions (Signed)
Read all of the handouts given to you from your recovery room nurse.  YOU HAD AN ENDOSCOPIC PROCEDURE TODAY AT [facility]:   Refer to the procedure report that was given to you for any specific questions about what was found during the examination.  If the procedure report does not answer your questions, please call your gastroenterologist to clarify.  If you requested that your care partner not be given the details of your procedure findings, then the procedure report has been included in a sealed envelope for you to review at your convenience later.  YOU SHOULD EXPECT: Some feelings of bloating in the abdomen. Passage of more gas than usual.  Walking can help get rid of the air that was put into your GI tract during the procedure and reduce the bloating. If you had a lower endoscopy (such as a colonoscopy or flexible sigmoidoscopy) you may notice spotting of blood in your stool or on the toilet paper. If you underwent a bowel prep for your procedure, you may not have a normal bowel movement for a few days.  Please Note:  You might notice some irritation and congestion in your nose or some drainage.  This is from the oxygen used during your procedure.  There is no need for concern and it should clear up in a day or so.  SYMPTOMS TO REPORT IMMEDIATELY:  Following lower endoscopy (colonoscopy or flexible sigmoidoscopy):  Excessive amounts of blood in the stool  Significant tenderness or worsening of abdominal pains  Swelling of the abdomen that is new, acute  Fever of 100F or higher   For urgent or emergent issues, a gastroenterologist can be reached at any hour by calling [phone]. Do not use MyChart messaging for urgent concerns.    DIET:  We do recommend a small meal at first, but then you may proceed to your regular diet.  Drink plenty of fluids but you should avoid alcoholic beverages for 24 hours.  ACTIVITY:  You should plan to take it easy for the rest of today  and you should NOT DRIVE or use heavy machinery until tomorrow (because of the sedation medicines used during the test).    FOLLOW UP: Our staff will call the number listed on your records 48-72 hours following your procedure to check on you and address any questions or concerns that you may have regarding the information given to you following your procedure. If we do not reach you, we will leave a message.  We will attempt to reach you two times.  During this call, we will ask if you have developed any symptoms of COVID 19. If you develop any symptoms (ie: fever, flu-like symptoms, shortness of breath, cough etc.) before then, please call [phone].  If you test positive for Covid 19 in the 2 weeks post procedure, please call and report this information to [place].    If any biopsies were taken you will be contacted by phone or by letter within the next 1-3 weeks.  Please call us at [phone] if you have not heard about the biopsies in 3 weeks.    SIGNATURES/CONFIDENTIALITY: You and/or your care partner have signed paperwork which will be entered into your electronic medical record.  These signatures attest to the fact that that the information above on your After Visit Summary has been reviewed and is understood.  Full responsibility of the confidentiality of this discharge information lies with you and/or your care-partner.

## 2021-08-09 NOTE — Progress Notes (Signed)
Report given to PACU, vss 

## 2021-08-09 NOTE — Op Note (Signed)
[place] [facility] Patient Name: [name] Procedure Date: 08/09/2021 3:59 PM MRN: [id number] Endoscopist: Ladene Artist , MD Age: 86 Referring MD:  Date of Birth: 1935-04-08 Gender: Male Account #: [id number] Procedure:                Colonoscopy Indications:              Surveillance: Personal history of adenomatous                            polyps on last colonoscopy > 3 years ago Medicines:                Monitored Anesthesia Care Procedure:                Pre-Anesthesia Assessment:                           - Prior to the procedure, a History and Physical                            was performed, and patient medications and                            allergies were reviewed. The patient's tolerance of                            previous anesthesia was also reviewed. The risks                            and benefits of the procedure and the sedation                            options and risks were discussed with the patient.                            All questions were answered, and informed consent                            was obtained. Prior Anticoagulants: The patient has                            taken no previous anticoagulant or antiplatelet                            agents. ASA Grade Assessment: III - A patient with                            severe systemic disease. After reviewing the risks                            and benefits, the patient was deemed in                            satisfactory condition to undergo the procedure.  After obtaining informed consent, the colonoscope                            was passed under direct vision. Throughout the                            procedure, the patient's blood pressure, pulse, and                            oxygen saturations were monitored continuously. The                            CF [id number] #[id number] was introduced through the anus                            and advanced to  the the cecum, identified by                            appendiceal orifice and ileocecal valve. The                            ileocecal valve, appendiceal orifice, and rectum                            were photographed. The quality of the bowel                            preparation was good. The colonoscopy was somewhat                            difficult due to a redundant colon, significant                            looping and a tortuous colon. The patient tolerated                            the procedure well. Scope In: 4:08:51 PM Scope Out: 4:31:09 PM Scope Withdrawal Time: 0 hours 16 minutes 40 seconds  Total Procedure Duration: 0 hours 22 minutes 18 seconds  Findings:                 The perianal and digital rectal examinations were                            normal.                           A 3 mm polyp was found in the cecum. The polyp was                            sessile. The polyp was removed with a cold biopsy                            forceps. Resection and retrieval were complete.  A single medium-sized localized angiodysplastic                            lesion without bleeding was found in the cecum.                           Six sessile polyps were found in the sigmoid colon                            (1), descending colon (2), transverse colon (2) and                            ascending colon (1). The polyps were 6 to 8 mm in                            size. These polyps were removed with a cold snare.                            Resection and retrieval were complete.                           A few small-mouthed diverticula were found in the                            left colon. There was no evidence of diverticular                            bleeding.                           Internal hemorrhoids were found during                            retroflexion. The hemorrhoids were small and Grade                            I (internal  hemorrhoids that do not prolapse).                           The exam was otherwise without abnormality on                            direct and retroflexion views. Complications:            No immediate complications. Estimated blood loss:                            None. Estimated Blood Loss:     Estimated blood loss: none. Impression:               - One 3 mm polyp in the cecum, removed with a cold                            biopsy forceps. Resected and retrieved.                           -  A single non-bleeding colonic angiodysplastic                            lesion.                           - Six 6 to 8 mm polyps in the sigmoid colon, in the                            descending colon, in the transverse colon and in                            the ascending colon, removed with a cold snare.                            Resected and retrieved.                           - Mild diverticulosis in the left colon.                           - Internal hemorrhoids.                           - The examination was otherwise normal on direct                            and retroflexion views. Recommendation:           - Consider repeat colonoscopy vs no repeat due to                            age after studies are complete for surveillance                            based on pathology results.                           - Patient has a contact number available for                            emergencies. The signs and symptoms of potential                            delayed complications were discussed with the                            patient. Return to normal activities tomorrow.                            Written discharge instructions were provided to the                            patient.                           -  Resume previous diet.                           - Continue present medications.                           - Await pathology results. Ladene Artist, MD 08/09/2021 4:36:44  PM This report has been signed electronically.

## 2021-08-09 NOTE — Progress Notes (Signed)
History of Present Illness: This is an 86 year old male referred by Dettinger, Fransisca Kaufmann, MD for the evaluation of a personal history of adenomatous colon polyps and a family history of colon cancer.  His last colonoscopy as below.  His mother developed colon cancer at age 52.  He has no ongoing gastrointestinal complaints.  Given his personal and family history he is very concerned about his colon polyp and colon cancer risk.  His overall health is very good.  He is maintained on ASA 81 mg twice daily. Denies weight loss, abdominal pain, constipation, diarrhea, change in stool caliber, melena, hematochezia, nausea, vomiting, dysphagia, reflux symptoms, chest pain.   Colonoscopy 06/2017 - A single non-bleeding colonic angiodysplastic lesion. - Three 6 to 7 mm polyps in the rectum, in the transverse colon and at the hepatic flexure, removed with a cold snare. Resected and retrieved. - Internal hemorrhoids. - The examination was otherwise normal on direct and retroflexion views.          Allergies  Allergen Reactions   Lamisil [Terbinafine Hcl] Other (See Comments)      Loss of taste           Outpatient Medications Prior to Visit  Medication Sig Dispense Refill   amLODipine-valsartan (EXFORGE) 10-320 MG tablet Take 1 tablet by mouth daily. 90 tablet 3   aspirin 81 MG tablet Take 81 mg by mouth 2 (two) times daily.        cetirizine (ZYRTEC) 10 MG tablet Take 1 tablet (10 mg total) by mouth daily. 90 tablet 3   Cholecalciferol (VITAMIN D3) 125 MCG (5000 UT) CAPS Take 1 capsule (5,000 Units total) by mouth daily. 90 capsule 3   colchicine 0.6 MG tablet Take 2 tabs immediately, then 1 tab twice per day for the duration of the flare up to a max of 7 days 21 tablet 0   Evolocumab (REPATHA SURECLICK) 283 MG/ML SOAJ INJECT 140 MG INTO THE SKIN EVERY 14 DAYS 6 mL 1   Iron Combinations (IRON COMPLEX PO) Take 60 mg by mouth daily.       mirabegron ER (MYRBETRIQ) 50 MG TB24 tablet Take 1 tablet (50 mg  total) by mouth daily. 90 tablet 3   pantoprazole (PROTONIX) 40 MG tablet Take 1 tablet (40 mg total) by mouth daily. TAKE ONE TABLET BY MOUTH EVERY DAY 30-60 MINUTES BEFORE FIRST MEAL OF THE DAY. 90 tablet 3   QUEtiapine (SEROQUEL) 50 MG tablet Take 1 tablet (50 mg total) by mouth at bedtime. 90 tablet 3   Tiotropium Bromide-Olodaterol (STIOLTO RESPIMAT) 2.5-2.5 MCG/ACT AERS Inhale 2 puffs into the lungs daily. 12 g 3    No facility-administered medications prior to visit.        Past Medical History:  Diagnosis Date   Adenomatous colon polyp 11/1999   Allergy     Blood transfusion without reported diagnosis     Cancer [facility])      Prostate   Carotid artery occlusion     Cataract     COPD (chronic obstructive pulmonary disease) (HCC)     GERD (gastroesophageal reflux disease)     Hyperlipidemia     Hypertension     Internal hemorrhoids     PVC (premature ventricular contraction)           Past Surgical History:  Procedure Laterality Date   APPENDECTOMY       carotid surgery       CATARACT EXTRACTION  both eyes   COLONOSCOPY       POLYPECTOMY       TRANSURETHRAL RESECTION OF PROSTATE       UPPER GASTROINTESTINAL ENDOSCOPY       UPPER GI ENDOSCOPY   Nov. 3, 2014    Upper endoscopy with diatation    Social History         Socioeconomic History   Marital status: Widowed      Spouse name: Not on file   Number of children: 2   Years of education: some college   Highest education level: 12th grade  Occupational History   Not on file  Tobacco Use   Smoking status: Former      Packs/day: 1.50      Years: 40.00      Pack years: 60.00      Types: Cigarettes      Quit date: 01/19/1997      Years since quitting: 24.4   Smokeless tobacco: Never  Vaping Use   Vaping Use: Never used  Substance and Sexual Activity   Alcohol use: No   Drug use: No   Sexual activity: Not on file  Other Topics Concern   Not on file  Social History Narrative    Widowed. Lives alone.      Social Determinants of Health       Financial Resource Strain: Low Risk    Difficulty of Paying Living Expenses: Not hard at all  Food Insecurity: No Food Insecurity   Worried About Charity fundraiser in the Last Year: Never true   [place] in the Last Year: Never true  Transportation Needs: No Transportation Needs   Lack of Transportation (Medical): No   Lack of Transportation (Non-Medical): No  Physical Activity: Insufficiently Active   Days of Exercise per Week: 1 day   Minutes of Exercise per Session: 10 min  Stress: Stress Concern Present   Feeling of Stress : To some extent  Social Connections: Moderately Integrated   Frequency of Communication with Friends and Family: More than three times a week   Frequency of Social Gatherings with Friends and Family: More than three times a week   Attends Religious Services: More than 4 times per year   Active Member of [employer] or Organizations: Yes   Attends Archivist Meetings: More than 4 times per year   Marital Status: Widowed         Family History  Problem Relation Age of Onset   Colon cancer Mother     Cancer Mother          colon   Heart disease Father          Heart Disease before age 57   Heart attack Father 76   Arthritis Sister     Esophageal cancer Neg Hx     Rectal cancer Neg Hx     Stomach cancer Neg Hx     Colon polyps Neg Hx           Review of Systems: Pertinent positive and negative review of systems were noted in the above HPI section. All other review of systems were otherwise negative.     Physical Exam: General: Well developed, well nourished, no acute distress Head: Normocephalic and atraumatic Eyes: Sclerae anicteric, EOMI Ears: Normal auditory acuity Mouth: Not examined, mask on during Covid-19 pandemic Neck: Supple, no masses or thyromegaly Lungs: Clear throughout to auscultation Heart: Regular rate and rhythm; no murmurs, rubs  or bruits Abdomen: Soft, non tender and non  distended. No masses, hepatosplenomegaly or hernias noted. Normal Bowel sounds Rectal: Deferred to colonoscopy Musculoskeletal: Symmetrical with no gross deformities  Skin: No lesions on visible extremities Pulses:  Normal pulses noted Extremities: No clubbing, cyanosis, edema or deformities noted Neurological: Alert oriented x 4, grossly nonfocal Cervical Nodes:  No significant cervical adenopathy Inguinal Nodes: No significant inguinal adenopathy Psychological:  Alert and cooperative. Normal mood and affect     Assessment and Recommendations:   Personal history of adenomatous colon polyps and family history of colon cancer, first-degree relative at age 59 for colonoscopy.

## 2021-08-10 ENCOUNTER — Telehealth: Payer: Self-pay

## 2021-08-10 NOTE — Telephone Encounter (Signed)
  Follow up Call-     08/09/2021    3:11 PM  Call back number  Post procedure Call Back phone  # [phone]  Permission to leave phone message Yes     Patient questions:  Do you have a fever, pain , or abdominal swelling? No. Pain Score  0 *  Have you tolerated food without any problems? Yes.    Have you been able to return to your normal activities? Yes.    Do you have any questions about your discharge instructions: Diet   No. Medications  No. Follow up visit  No.  Do you have questions or concerns about your Care? No.  Actions: * If pain score is 4 or above: No action needed, pain <4.

## 2021-08-16 ENCOUNTER — Encounter: Payer: Self-pay | Admitting: Gastroenterology

## 2021-08-20 ENCOUNTER — Ambulatory Visit (INDEPENDENT_AMBULATORY_CARE_PROVIDER_SITE_OTHER): Payer: Medicare Other | Admitting: Family Medicine

## 2021-08-20 ENCOUNTER — Encounter: Payer: Self-pay | Admitting: Family Medicine

## 2021-08-20 VITALS — BP 122/69 | HR 69 | Temp 98.0°F | Ht 70.0 in | Wt 190.0 lb

## 2021-08-20 DIAGNOSIS — M545 Low back pain, unspecified: Secondary | ICD-10-CM

## 2021-08-20 MED ORDER — DICLOFENAC SODIUM 75 MG PO TBEC: 75.0000 mg | DELAYED_RELEASE_TABLET | Freq: Two times a day (BID) | ORAL | 2 refills | Status: DC

## 2021-08-20 MED ORDER — PREDNISONE 20 MG PO TABS: ORAL_TABLET | ORAL | 0 refills | Status: DC

## 2021-08-20 NOTE — Progress Notes (Signed)
BP 122/69   Pulse 69   Temp 98 F (36.7 C)   Ht '5\' 10"'$  (1.778 m)   Wt 190 lb (86.2 kg)   SpO2 95%   BMI 27.26 kg/m    Subjective:   Patient ID: [name], male    DOB: 1935-04-06, 86 y.o.   MRN: [id number]  HPI: [name] is a 86 y.o. male presenting on 08/20/2021 for Arthritis (Flare-lower back)   HPI Patient comes in complaining of lower back pain.  He says its been hurting for 3 to 4 weeks and the mid part of his lower back.  He says some days of the good and some days of the bad and some days it hurts to walk or even get up.  He says it has kept him up at night sometimes as well.  He says he is use Tylenol arthritis and it does help some but is just not getting better.  Walking or being on his feet is worse sitting sometimes makes it better.  Relevant past medical, surgical, family and social history reviewed and updated as indicated. Interim medical history since our last visit reviewed. Allergies and medications reviewed and updated.  Review of Systems  Constitutional:  Negative for chills and fever.  Eyes:  Negative for visual disturbance.  Respiratory:  Negative for shortness of breath and wheezing.   Cardiovascular:  Negative for chest pain and leg swelling.  Musculoskeletal:  Positive for back pain and myalgias. Negative for arthralgias and gait problem.  Skin:  Negative for rash.  All other systems reviewed and are negative.  Per HPI unless specifically indicated above       Objective:   BP 122/69   Pulse 69   Temp 98 F (36.7 C)   Ht '5\' 10"'$  (1.778 m)   Wt 190 lb (86.2 kg)   SpO2 95%   BMI 27.26 kg/m   Wt Readings from Last 3 Encounters:  08/20/21 190 lb (86.2 kg)  07/29/21 189 lb (85.7 kg)  07/05/21 189 lb (85.7 kg)    Physical Exam Vitals and nursing note reviewed.  Constitutional:      General: He is not in acute distress.    Appearance: He is well-developed. He is not diaphoretic.  Eyes:     General: No scleral icterus.     Conjunctiva/sclera: Conjunctivae normal.  Musculoskeletal:        General: Normal range of motion.     Lumbar back: Tenderness and bony tenderness present. No swelling or deformity. Normal range of motion. Negative right straight leg raise test and negative left straight leg raise test. No scoliosis.       Back:  Skin:    General: Skin is warm and dry.     Findings: No rash.  Neurological:     Mental Status: He is alert and oriented to person, place, and time.     Coordination: Coordination normal.  Psychiatric:        Behavior: Behavior normal.      Assessment & Plan:   Problem List Items Addressed This Visit   None Visit Diagnoses     Lumbar pain    -  Primary   Relevant Medications   predniSONE (DELTASONE) 20 MG tablet   diclofenac (VOLTAREN) 75 MG EC tablet       Likely flareup of arthritis related to his degenerative arthritis condition.  We will treat with anti-inflammatory to see if we can calm it down, if not then we may  need to go for possible injections. Follow up plan: Return if symptoms worsen or fail to improve.  Counseling provided for all of the vaccine components No orders of the defined types were placed in this encounter.   Caryl Pina, MD [facility] 08/20/2021, 8:24 AM

## 2021-08-24 NOTE — Progress Notes (Unsigned)
Cardiology Office Note   Date:  08/25/2021   ID:  [name], DOB 06/23/1935, MRN [id number]  PCP:  Dettinger, Fransisca Kaufmann, MD  Cardiologist:   Minus Breeding, MD    Chief Complaint  Patient presents with   Palpitations     History of Present Illness: [name] is a 86 y.o. male who presents for evaluation of dyslipidemia and bradycardia.    He's intolerant of all statins. He gets diffuse muscle aches. He does have peripheral vascular disease.  He previously saw Dr. Mare Ferrari once in 2015 for bradycardia.  He had NSR with PVCs/bigeminy.  He apparently did no perfuse the PVCs.  He has had CEA in the past.  He's had negative stress testing some years ago.  He did have an essentially normal echo in 2017.  At a previous visit he had SOB and he had negative Lexiscan Myoview 2020.     Since he was last seen he has done well.  Has been limited more by back pain and joint pain.  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  He says his breathing has been better since he started [employer].  The patient denies any new symptoms such as chest discomfort, neck or arm discomfort. There has been no new shortness of breath, PND or orthopnea. There have been no reported palpitations, presyncope or syncope.   Past Medical History:  Diagnosis Date   Adenomatous colon polyp 11/1999   Allergy    Blood transfusion without reported diagnosis    Cancer [facility])    Prostate   Carotid artery occlusion    Cataract    COPD (chronic obstructive pulmonary disease) (HCC)    GERD (gastroesophageal reflux disease)    Hyperlipidemia    Hypertension    Internal hemorrhoids    PVC (premature ventricular contraction)     Past Surgical History:  Procedure Laterality Date   APPENDECTOMY     carotid surgery     CATARACT EXTRACTION     both eyes   COLONOSCOPY     POLYPECTOMY     TRANSURETHRAL RESECTION OF PROSTATE     UPPER GASTROINTESTINAL ENDOSCOPY     UPPER GI ENDOSCOPY  Nov. 3, 2014   Upper endoscopy  with diatation     Current Outpatient Medications  Medication Sig Dispense Refill   amLODipine-valsartan (EXFORGE) 10-320 MG tablet Take 1 tablet by mouth daily. 90 tablet 3   aspirin 81 MG tablet Take 81 mg by mouth 2 (two) times daily.      cetirizine (ZYRTEC) 10 MG tablet Take 1 tablet (10 mg total) by mouth daily. 90 tablet 3   Cholecalciferol (VITAMIN D3) 125 MCG (5000 UT) CAPS Take 1 capsule (5,000 Units total) by mouth daily. 90 capsule 3   diclofenac (VOLTAREN) 75 MG EC tablet Take 1 tablet (75 mg total) by mouth 2 (two) times daily. 60 tablet 2   Evolocumab (REPATHA SURECLICK) 779 MG/ML SOAJ INJECT 140 MG INTO THE SKIN EVERY 14 DAYS 6 mL 1   famotidine (PEPCID) 20 MG tablet Take 20 mg by mouth daily.     Iron Combinations (IRON COMPLEX PO) Take 60 mg by mouth daily.     mirabegron ER (MYRBETRIQ) 50 MG TB24 tablet Take 1 tablet (50 mg total) by mouth daily. 90 tablet 3   pantoprazole (PROTONIX) 40 MG tablet Take 1 tablet (40 mg total) by mouth daily. TAKE ONE TABLET BY MOUTH EVERY DAY 30-60 MINUTES BEFORE FIRST MEAL OF THE DAY.  90 tablet 3   QUEtiapine (SEROQUEL) 50 MG tablet Take 1 tablet (50 mg total) by mouth at bedtime. 90 tablet 3   Tiotropium Bromide-Olodaterol (STIOLTO RESPIMAT) 2.5-2.5 MCG/ACT AERS Inhale 2 puffs into the lungs daily. 12 g 3   colchicine 0.6 MG tablet Take 2 tabs immediately, then 1 tab twice per day for the duration of the flare up to a max of 7 days 21 tablet 0   No current facility-administered medications for this visit.    Allergies:   Lamisil [terbinafine hcl]    ROS:  Please see the history of present illness.   Otherwise, review of systems are positive for none.   All other systems are reviewed and negative.    PHYSICAL EXAM: VS:  BP 138/80   Pulse 64   Ht '5\' 10"'$  (1.778 m)   Wt 194 lb 9.6 oz (88.3 kg)   SpO2 96%   BMI 27.92 kg/m  , BMI Body mass index is 27.92 kg/m.  GENERAL:  Well appearing NECK:  No jugular venous distention, waveform  within normal limits, carotid upstroke brisk and symmetric, no bruits, no thyromegaly LUNGS:  Clear to auscultation bilaterally CHEST:  Unremarkable HEART:  PMI not displaced or sustained,S1 and S2 within normal limits, no S3, no S4, no clicks, no rubs, no murmurs ABD:  Flat, positive bowel sounds normal in frequency in pitch, no bruits, no rebound, no guarding, no midline pulsatile mass, no hepatomegaly, no splenomegaly EXT:  2 plus pulses throughout, no edema, no cyanosis no clubbing   EKG:  EKG is  ordered today. Ventricular bigeminy, normal sinus rhythm, no acute ST-T wave changes  Recent Labs: 12/14/2020: ALT 29; Hemoglobin 13.9; Platelets 166 03/25/2021: BUN 11; Creatinine, Ser 1.21; Potassium 4.1; Sodium 140    Lipid Panel    Component Value Date/Time   CHOL 126 12/14/2020 0834   TRIG 206 (H) 12/14/2020 0834   HDL 39 (L) 12/14/2020 0834   CHOLHDL 3.2 12/14/2020 0834   LDLCALC 54 12/14/2020 0834      Wt Readings from Last 3 Encounters:  08/25/21 194 lb 9.6 oz (88.3 kg)  08/20/21 190 lb (86.2 kg)  07/29/21 189 lb (85.7 kg)      Other studies Reviewed: Additional studies/ records that were reviewed today include: Labs   ASSESSMENT AND PLAN:  SOB:   This is improved and was likely related to COPD.  No change in therapy or further imaging.  He had no new symptoms since his perfusion study in 2020.  CAROTID STENOSIS:    This is followed every other year with ultrasound by [facility].    DYSLIPIDEMIA:   LDL was 154 with an HDL of 39.  No change in therapy.  HTN:  The blood pressure is at target.  No change in therapy.  PVCs: He does not feel these.  No further imaging or evaluation is indicated.     Current medicines are reviewed at length with the patient today.  The patient does not have concerns regarding medicines.  The following changes have been made: None  Labs/ tests ordered today include: None Orders Placed This Encounter  Procedures   EKG 12-Lead      Disposition:   Follow-up with me as needed.    Signed, Minus Breeding, MD  08/25/2021 4:21 PM    [facility]

## 2021-08-25 ENCOUNTER — Ambulatory Visit (INDEPENDENT_AMBULATORY_CARE_PROVIDER_SITE_OTHER): Payer: Medicare Other | Admitting: Cardiology

## 2021-08-25 ENCOUNTER — Encounter: Payer: Self-pay | Admitting: Cardiology

## 2021-08-25 ENCOUNTER — Telehealth: Payer: Self-pay | Admitting: Family Medicine

## 2021-08-25 VITALS — BP 138/80 | HR 64 | Ht 70.0 in | Wt 194.6 lb

## 2021-08-25 DIAGNOSIS — I493 Ventricular premature depolarization: Secondary | ICD-10-CM | POA: Diagnosis not present

## 2021-08-25 DIAGNOSIS — I10 Essential (primary) hypertension: Secondary | ICD-10-CM

## 2021-08-25 DIAGNOSIS — E785 Hyperlipidemia, unspecified: Secondary | ICD-10-CM

## 2021-08-25 DIAGNOSIS — F5104 Psychophysiologic insomnia: Secondary | ICD-10-CM

## 2021-08-25 DIAGNOSIS — R0602 Shortness of breath: Secondary | ICD-10-CM

## 2021-08-25 DIAGNOSIS — N3281 Overactive bladder: Secondary | ICD-10-CM

## 2021-08-25 NOTE — Telephone Encounter (Signed)
Pt called and is very upset. Says he only uses [facility] is he needs a Rx right away. Says all of his meds that he takes on a regular basis is [facility] and none of them were and pt is getting very frustrated.  Needs [place] to void Rx's at [facility] and send them to [place].  AMLODIPINE ZYRTEC MYRBETRIQ SEROQUEL

## 2021-08-25 NOTE — Patient Instructions (Signed)
Medication Instructions:  The current medical regimen is effective;  continue present plan and medications.  *If you need a refill on your cardiac medications before your next appointment, please call your pharmacy*  Follow-Up: At [facility], you and your health needs are our priority.  As part of our continuing mission to provide you with exceptional heart care, we have created designated Provider Care Teams.  These Care Teams include your primary Cardiologist (physician) and Advanced Practice Providers (APPs -  Physician Assistants and Nurse Practitioners) who all work together to provide you with the care you need, when you need it.  We recommend signing up for the patient portal called "MyChart".  Sign up information is provided on this After Visit Summary.  MyChart is used to connect with patients for Virtual Visits (Telemedicine).  Patients are able to view lab/test results, encounter notes, upcoming appointments, etc.  Non-urgent messages can be sent to your provider as well.   To learn more about what you can do with MyChart, go to [email or web address].    Your next appointment:   Follow up as needed with Dr Hochrein.   Important Information About Sugar       

## 2021-08-26 MED ORDER — QUETIAPINE FUMARATE 50 MG PO TABS: 50.0000 mg | ORAL_TABLET | Freq: Every day | ORAL | 2 refills | Status: DC

## 2021-08-26 MED ORDER — MIRABEGRON ER 50 MG PO TB24: 50.0000 mg | ORAL_TABLET | Freq: Every day | ORAL | 2 refills | Status: DC

## 2021-08-26 MED ORDER — AMLODIPINE BESYLATE-VALSARTAN 10-320 MG PO TABS: 1.0000 | ORAL_TABLET | Freq: Every day | ORAL | 2 refills | Status: DC

## 2021-08-26 MED ORDER — CETIRIZINE HCL 10 MG PO TABS: 10.0000 mg | ORAL_TABLET | Freq: Every day | ORAL | 2 refills | Status: DC

## 2021-08-26 NOTE — Telephone Encounter (Signed)
LMOVM 4 requested medications were sent to [facility]. Refills were sent in March for 90 days w/ 3 refills, sent remaining refills to [facility]

## 2021-09-13 ENCOUNTER — Encounter: Payer: Self-pay | Admitting: Family Medicine

## 2021-09-13 ENCOUNTER — Ambulatory Visit (INDEPENDENT_AMBULATORY_CARE_PROVIDER_SITE_OTHER): Payer: Medicare Other | Admitting: Family Medicine

## 2021-09-13 VITALS — BP 136/74 | HR 71 | Temp 98.0°F | Ht 70.0 in | Wt 190.0 lb

## 2021-09-13 DIAGNOSIS — Z8546 Personal history of malignant neoplasm of prostate: Secondary | ICD-10-CM

## 2021-09-13 DIAGNOSIS — Z125 Encounter for screening for malignant neoplasm of prostate: Secondary | ICD-10-CM

## 2021-09-13 DIAGNOSIS — J449 Chronic obstructive pulmonary disease, unspecified: Secondary | ICD-10-CM

## 2021-09-13 DIAGNOSIS — E785 Hyperlipidemia, unspecified: Secondary | ICD-10-CM

## 2021-09-13 DIAGNOSIS — I10 Essential (primary) hypertension: Secondary | ICD-10-CM | POA: Diagnosis not present

## 2021-09-13 DIAGNOSIS — Z23 Encounter for immunization: Secondary | ICD-10-CM

## 2021-09-14 ENCOUNTER — Other Ambulatory Visit: Payer: Medicare Other

## 2021-09-14 DIAGNOSIS — I10 Essential (primary) hypertension: Secondary | ICD-10-CM

## 2021-09-14 DIAGNOSIS — E785 Hyperlipidemia, unspecified: Secondary | ICD-10-CM

## 2021-09-14 DIAGNOSIS — Z125 Encounter for screening for malignant neoplasm of prostate: Secondary | ICD-10-CM

## 2021-09-14 DIAGNOSIS — J449 Chronic obstructive pulmonary disease, unspecified: Secondary | ICD-10-CM

## 2021-09-14 DIAGNOSIS — Z8546 Personal history of malignant neoplasm of prostate: Secondary | ICD-10-CM

## 2021-09-15 LAB — CBC WITH DIFFERENTIAL/PLATELET
Basophils Absolute: 0.1 10*3/uL (ref 0.0–0.2)
Basos: 1 %
EOS (ABSOLUTE): 0.4 10*3/uL (ref 0.0–0.4)
Eos: 5 %
Hematocrit: 38.7 % (ref 37.5–51.0)
Hemoglobin: 12.7 g/dL — ABNORMAL LOW (ref 13.0–17.7)
Immature Grans (Abs): 0.1 10*3/uL (ref 0.0–0.1)
Immature Granulocytes: 1 %
Lymphocytes Absolute: 1.4 10*3/uL (ref 0.7–3.1)
Lymphs: 17 %
MCH: 29.8 pg (ref 26.6–33.0)
MCHC: 32.8 g/dL (ref 31.5–35.7)
MCV: 91 fL (ref 79–97)
Monocytes Absolute: 0.5 10*3/uL (ref 0.1–0.9)
Monocytes: 6 %
Neutrophils Absolute: 5.9 10*3/uL (ref 1.4–7.0)
Neutrophils: 70 %
Platelets: 212 10*3/uL (ref 150–450)
RBC: 4.26 x10E6/uL (ref 4.14–5.80)
RDW: 13.2 % (ref 11.6–15.4)
WBC: 8.4 10*3/uL (ref 3.4–10.8)

## 2021-09-15 LAB — CMP14+EGFR
ALT: 17 IU/L (ref 0–44)
AST: 16 IU/L (ref 0–40)
Albumin/Globulin Ratio: 1.8 (ref 1.2–2.2)
Albumin: 4.4 g/dL (ref 3.6–4.6)
Alkaline Phosphatase: 77 IU/L (ref 44–121)
BUN/Creatinine Ratio: 13 (ref 10–24)
BUN: 12 mg/dL (ref 8–27)
Bilirubin Total: 0.4 mg/dL (ref 0.0–1.2)
CO2: 19 mmol/L — ABNORMAL LOW (ref 20–29)
Calcium: 10 mg/dL (ref 8.6–10.2)
Chloride: 102 mmol/L (ref 96–106)
Creatinine, Ser: 0.96 mg/dL (ref 0.76–1.27)
Globulin, Total: 2.4 g/dL (ref 1.5–4.5)
Glucose: 142 mg/dL — ABNORMAL HIGH (ref 70–99)
Potassium: 4.4 mmol/L (ref 3.5–5.2)
Sodium: 136 mmol/L (ref 134–144)
Total Protein: 6.8 g/dL (ref 6.0–8.5)
eGFR: 78 mL/min/{1.73_m2} (ref >59)

## 2021-09-15 LAB — LIPID PANEL
Chol/HDL Ratio: 3 ratio (ref 0.0–5.0)
Cholesterol, Total: 115 mg/dL (ref 100–199)
HDL: 38 mg/dL — ABNORMAL LOW (ref >39)
LDL Chol Calc (NIH): 37 mg/dL (ref 0–99)
Triglycerides: 264 mg/dL — ABNORMAL HIGH (ref 0–149)
VLDL Cholesterol Cal: 40 mg/dL (ref 5–40)

## 2021-09-15 LAB — PSA, TOTAL AND FREE
PSA, Free: <0.02 ng/mL
Prostate Specific Ag, Serum: <0.1 ng/mL (ref 0.0–4.0)

## 2021-10-12 ENCOUNTER — Telehealth: Payer: Self-pay | Admitting: Pharmacist Clinician (PhC)/ Clinical Pharmacy Specialist

## 2021-10-12 NOTE — Telephone Encounter (Signed)
Repatha PA approved to 10/11/2022  [facility] [id number]

## 2021-10-25 DIAGNOSIS — L570 Actinic keratosis: Secondary | ICD-10-CM | POA: Diagnosis not present

## 2021-10-25 DIAGNOSIS — L820 Inflamed seborrheic keratosis: Secondary | ICD-10-CM | POA: Diagnosis not present

## 2021-10-25 DIAGNOSIS — X32XXXD Exposure to sunlight, subsequent encounter: Secondary | ICD-10-CM | POA: Diagnosis not present

## 2021-10-28 ENCOUNTER — Other Ambulatory Visit: Payer: Self-pay | Admitting: Cardiology

## 2021-10-28 DIAGNOSIS — E785 Hyperlipidemia, unspecified: Secondary | ICD-10-CM

## 2021-10-29 ENCOUNTER — Telehealth: Payer: Self-pay | Admitting: Internal Medicine

## 2021-10-29 MED ORDER — PANTOPRAZOLE SODIUM 40 MG PO TBEC: 40.0000 mg | DELAYED_RELEASE_TABLET | Freq: Every day | ORAL | 0 refills | Status: DC

## 2021-10-29 NOTE — Telephone Encounter (Signed)
The patient is aware to keep the follow up to get further refills. Nothing further needed.

## 2021-11-04 ENCOUNTER — Ambulatory Visit: Payer: Medicare Other | Admitting: Internal Medicine

## 2021-11-24 ENCOUNTER — Ambulatory Visit (INDEPENDENT_AMBULATORY_CARE_PROVIDER_SITE_OTHER): Payer: Medicare Other | Admitting: Internal Medicine

## 2021-11-24 ENCOUNTER — Ambulatory Visit (INDEPENDENT_AMBULATORY_CARE_PROVIDER_SITE_OTHER): Payer: Medicare Other

## 2021-11-24 ENCOUNTER — Encounter: Payer: Self-pay | Admitting: Internal Medicine

## 2021-11-24 DIAGNOSIS — J449 Chronic obstructive pulmonary disease, unspecified: Secondary | ICD-10-CM

## 2021-11-24 DIAGNOSIS — Z77090 Contact with and (suspected) exposure to asbestos: Secondary | ICD-10-CM | POA: Diagnosis not present

## 2021-11-24 MED ORDER — STIOLTO RESPIMAT 2.5-2.5 MCG/ACT IN AERS: 2.0000 | INHALATION_SPRAY | Freq: Every day | RESPIRATORY_TRACT | 3 refills | Status: DC

## 2021-11-24 MED ORDER — ALBUTEROL SULFATE HFA 108 (90 BASE) MCG/ACT IN AERS: INHALATION_SPRAY | RESPIRATORY_TRACT | 11 refills | Status: AC

## 2021-11-24 NOTE — Progress Notes (Signed)
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 Pt c/o SOB x 5 years, worse x 1 1/5 years. He states he gets winded walking to the mailbox. He is using his ventolin inhaler 4 x per wk on average.   Dyspnea:  Daily 250 ft uphill to mailbox can make it s stopping at slower pace Cough: dry, daytime Sleep: fine on side/ 2 pillows / flat bad SABA use: not really helping, never prechallenges or rechallenges  rec Stop ramapril  Start on ibesartan 20 mg daily and your symptoms should gradually resolve Stop trelegy  Only use your albuterol as a rescue medication to be used if you can't catch your breath Change prilosec Take 30-60 min before first meal of the day    06/03/2020  f/u ov/[name] re: UACS/? ACE case  Chief Complaint  Patient presents with   Follow-up    Breathing is unchanged since the last visit. He has not been exerting himself much at all. He rarely uses the albuterol inhaler. He states he been wheezing more often. He has cough occ with clear sputum.   Dyspnea:  No longer mailbox walking  Cough: assoc with watery rhinitis x years aware of it at hs but blows nose and then ok for rest of noct "allergy much worse when stopped prilosec in past"  Sleeping: bed is flat /on side 2 pillows  SABA use: not using  02: none  Covid status:   X 3  rec  To get the most out of exercise, you need to be continuously aware that you are  short of breath, but never out of breath, for 30 minutes daily.  Ok to Try albuterol 15 min before an activity that you know would make you short of breath and see if it makes any difference and if makes none then don't take it after activity unless you can't catch your breath. Stop prilosec and Pantoprazole (protonix) 40 mg (same 2 prilosec)   Take  30-60 min before first meal of the day and Pepcid (famotidine)  20 mg after supper  until return to office  GERD diet  Prednisone 10 mg take  4 each am x 2 days,   2 each am x 2 days,  1 each am x 2 days and stop      07/17/2020  f/u ov/[name] re:  GOLD II copd on prn saba only  Chief Complaint  Patient presents with   Follow-up    Still getting SOB with activities, occ dry cough  Dyspnea:  Does fine at [employer] leaning on cart which he only does after using pain med o/w more limited by back than breathing Cough: same despite reflux rx and off acei, no change  Sleeping: flat bed / 2 pillows  SABA use: using 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   - Allergy profile 07/17/2020 >  Eos 0.4 /  IgE  120 > only symptoms in spring controlled with zyrtec   11/04/2020  f/u ov/[name] re: GOLD II MM COPD  Chief Complaint  Patient presents with   Follow-up    He does not have any concerns.    Dyspnea:  [employer] walking s cart/ no HC parking - overall much better on stiolto  Cough: none  Sleeping: no resp cc  SABA use: none  02: none  Covid status:   vax x 4  Rec Continue Stiolto 2 puffs each  am  Please schedule a follow up visit in 12 months but call sooner if needed      11/24/2021  f/u ov/[name] re: GOLD 2 copd  maint on stiolto 2 each am   Chief Complaint  Patient presents with   Follow-up    Sob-same, denies cough/wheeze   Dyspnea:  Not limited by breathing from desired activities  but by hip pain bilaterally  Cough: none  Sleeping: flat bed/ 2 pillows no resp cc  SABA use: none  02: none      No obvious day to day or daytime variability or assoc excess/ purulent sputum or mucus plugs or hemoptysis or cp or chest tightness, subjective wheeze or overt sinus or hb symptoms.   Sleeping  without nocturnal  or early am exacerbation  of respiratory  c/o's or need for noct saba. Also denies any obvious fluctuation of symptoms with weather or environmental changes or other aggravating or alleviating factors except as outlined above   No unusual exposure hx or h/o childhood pna/ asthma or knowledge of premature birth.  Current Allergies, Complete Past Medical History, Past Surgical History, Family History, and Social History were reviewed in [employer] record.  ROS  The following are not active complaints unless bolded Hoarseness, sore throat, dysphagia, dental problems, itching, sneezing,  nasal congestion or discharge of excess mucus or purulent secretions, ear ache,   fever, chills, sweats, unintended wt loss or wt gain, classically pleuritic or exertional cp,  orthopnea pnd or arm/hand swelling  or leg swelling, presyncope, palpitations, abdominal pain, anorexia, nausea, vomiting, diarrhea  or change in bowel habits or change in bladder habits, change in stools or change in urine, dysuria, hematuria,  rash, arthralgias, visual complaints, headache, numbness, weakness or ataxia or problems with walking or coordination,  change in mood or  memory.        Current Meds  Medication Sig   amLODipine-valsartan (EXFORGE) 10-320 MG tablet Take 1 tablet by mouth  daily.   aspirin 81 MG tablet Take 81 mg by mouth 2 (two) times daily.    cetirizine (ZYRTEC) 10 MG tablet Take 1 tablet (10 mg total) by mouth daily.   Cholecalciferol (VITAMIN D3) 125 MCG (5000 UT) CAPS Take 1 capsule (5,000 Units total) by mouth daily.   Iron Combinations (IRON COMPLEX PO) Take 60 mg  by mouth daily.   mirabegron ER (MYRBETRIQ) 50 MG TB24 tablet Take 1 tablet (50 mg total) by mouth daily.   pantoprazole (PROTONIX) 40 MG tablet Take 1 tablet (40 mg total) by mouth daily. TAKE ONE TABLET BY MOUTH EVERY DAY 30-60 MINUTES BEFORE FIRST MEAL OF THE DAY.   QUEtiapine (SEROQUEL) 50 MG tablet Take 1 tablet (50 mg total) by mouth at bedtime.   REPATHA SURECLICK 902 MG/ML SOAJ INJECT 140 MG INTO THE SKIN EVERY 14 DAYS   Tiotropium Bromide-Olodaterol (STIOLTO RESPIMAT) 2.5-2.5 MCG/ACT AERS Inhale 2 puffs into the lungs daily.   vitamin E 45 MG (100 UNITS) capsule Take 100 Units by mouth daily.                     Past Medical History:  Diagnosis Date   Adenomatous colon polyp 11/1999   Allergy    Blood transfusion without reported diagnosis    Cancer [facility])    Prostate   Carotid artery occlusion    Cataract    COPD (chronic obstructive pulmonary disease) (HCC)    GERD (gastroesophageal reflux disease)    Hyperlipidemia    Hypertension    Internal hemorrhoids    PVC (premature ventricular contraction)       Objective:    Wts  11/24/2021         189  11/04/2020       192   07/17/2020       192  06/03/20 191 lb 12.8 oz (87 kg)  05/13/20 190 lb (86.2 kg)  04/22/20 189 lb (85.7 kg)    Vital signs reviewed  11/24/2021  - Note at rest 02 sats  98% on RA   General appearance:    amb slt hoarse pleasant wm nad     HEENT : Oropharynx  clear  Nasal turbinates nl    NECK :  without  apparent JVD/ palpable Nodes/TM    LUNGS: no acc muscle use,  Min barrel  contour chest wall with bilateral  slightly decreased bs s audible wheeze and  without cough on insp or exp maneuvers  and min  Hyperresonant  to  percussion bilaterally    CV:  RRR  no s3 or murmur or increase in P2, and trace bilateral LE edema  ABD:  soft and nontender with pos end  insp Hoover's  in the supine position.  No bruits or organomegaly appreciated   MS:  Nl gait/ ext warm without deformities Or obvious joint restrictions  calf tenderness, cyanosis or clubbing     SKIN: warm and dry without lesions    NEURO:  alert, approp, nl sensorium with  no motor or cerebellar deficits apparent.         CXR PA and Lateral:   11/24/2021 :    I personally reviewed images and agree with radiology impression as follows:    No acute cardiopulmonary process.     Assessment

## 2021-11-24 NOTE — Patient Instructions (Addendum)
Plan A = Automatic = Always=    Stiolto 2 puffs each am   Plan B = Backup (to supplement plan A, not to replace it) Only use your albuterol inhaler as a rescue medication to be used if you can't catch your breath by resting or doing a relaxed purse lip breathing pattern.  - The less you use it, the better it will work when you need it. - Ok to use the inhaler up to 2 puffs  every 4 hours if you must but call for appointment if use goes up over your usual need - Don't leave home without it !!  (think of it like the spare tire for your car)    Please remember to go to the  x-ray department  for your tests - we will call you with the results when they are available       Please schedule a follow up visit in 12  months but call sooner if needed

## 2021-11-24 NOTE — Assessment & Plan Note (Addendum)
Quit smoking 1998 - PFT's  07/17/2020  FEV1 1.88 (67 % ) ratio 0.65  p 13 % improvement from saba p nothing prior to study with DLCO  17.67 (73%) corrects to 3.88 (99%)  for alv volume and FV curve classic concave pattern  - 07/17/2020  After extensive coaching inhaler device,  effectiveness =    90% with SMI > trial of stiolto 2 puffs each am  - Labs ordered 07/17/2020  :    alpha one AT phenotype  MM  Pt is Group B in terms of symptom/risk and laba/lama therefore appropriate rx at this point >>>  stiolto and approp saba:  Re SABA :  I spent extra time with pt today reviewing appropriate use of albuterol for prn use on exertion with the following points: 1) saba is for relief of sob that does not improve by walking a slower pace or resting but rather if the pt does not improve after trying this first. 2) If the pt is convinced, as many are, that saba helps recover from activity faster then it's easy to tell if this is the case by re-challenging : ie stop, take the inhaler, then p 5 minutes try the exact same activity (intensity of workload) that just caused the symptoms and see if they are substantially diminished or not after saba 3) if there is an activity that reproducibly causes the symptoms, try the saba 15 min before the activity on alternate days   If in fact the saba really does help, then fine to continue to use it prn but advised may need to look closer at the maintenance regimen being used to achieve better control of airways disease with exertion.            Each maintenance medication was reviewed in detail including emphasizing most importantly the difference between maintenance and prns and under what circumstances the prns are to be triggered using an action plan format where appropriate.  Total time for H and P, chart review, counseling, reviewing smi  device(s) and generating customized AVS unique to this office visit / same day charting = 25 min

## 2021-11-25 ENCOUNTER — Encounter: Payer: Self-pay | Admitting: Internal Medicine

## 2022-02-25 ENCOUNTER — Telehealth: Payer: Self-pay | Admitting: Family Medicine

## 2022-02-25 DIAGNOSIS — I10 Essential (primary) hypertension: Secondary | ICD-10-CM

## 2022-02-25 MED ORDER — AMLODIPINE-OLMESARTAN 10-40 MG PO TABS: 1.0000 | ORAL_TABLET | Freq: Every day | ORAL | 3 refills | Status: DC

## 2022-02-25 NOTE — Telephone Encounter (Signed)
Due to being out of stock, sent a different blood pressure medicine that he can try.

## 2022-02-25 NOTE — Telephone Encounter (Signed)
Pt informed that amlodipine has been sent. Pt understood and has no further concerns.

## 2022-03-09 ENCOUNTER — Encounter: Payer: Self-pay | Admitting: Family Medicine

## 2022-03-09 ENCOUNTER — Ambulatory Visit (INDEPENDENT_AMBULATORY_CARE_PROVIDER_SITE_OTHER): Payer: Medicare Other | Admitting: Family Medicine

## 2022-03-09 VITALS — BP 135/67 | HR 70 | Temp 97.6°F | Ht 70.0 in | Wt 188.0 lb

## 2022-03-09 DIAGNOSIS — F5104 Psychophysiologic insomnia: Secondary | ICD-10-CM

## 2022-03-09 DIAGNOSIS — E785 Hyperlipidemia, unspecified: Secondary | ICD-10-CM | POA: Diagnosis not present

## 2022-03-09 DIAGNOSIS — N3281 Overactive bladder: Secondary | ICD-10-CM

## 2022-03-09 DIAGNOSIS — I10 Essential (primary) hypertension: Secondary | ICD-10-CM

## 2022-03-09 DIAGNOSIS — Z23 Encounter for immunization: Secondary | ICD-10-CM

## 2022-03-09 DIAGNOSIS — I679 Cerebrovascular disease, unspecified: Secondary | ICD-10-CM | POA: Diagnosis not present

## 2022-03-09 DIAGNOSIS — I2510 Atherosclerotic heart disease of native coronary artery without angina pectoris: Secondary | ICD-10-CM | POA: Diagnosis not present

## 2022-03-09 MED ORDER — CETIRIZINE HCL 10 MG PO TABS: 10.0000 mg | ORAL_TABLET | Freq: Every day | ORAL | 3 refills | Status: DC

## 2022-03-09 MED ORDER — QUETIAPINE FUMARATE 100 MG PO TABS: 100.0000 mg | ORAL_TABLET | Freq: Every day | ORAL | 3 refills | Status: DC

## 2022-03-09 MED ORDER — MIRABEGRON ER 50 MG PO TB24: 50.0000 mg | ORAL_TABLET | Freq: Every day | ORAL | 3 refills | Status: DC

## 2022-03-09 MED ORDER — AMLODIPINE-OLMESARTAN 10-40 MG PO TABS: 1.0000 | ORAL_TABLET | Freq: Every day | ORAL | 3 refills | Status: DC

## 2022-03-09 MED ORDER — VITAMIN D3 125 MCG (5000 UT) PO CAPS: 5000.0000 [IU] | ORAL_CAPSULE | Freq: Every day | ORAL | 3 refills | Status: DC

## 2022-03-09 NOTE — Progress Notes (Signed)
BP 135/67   Pulse 70   Temp 97.6 F (36.4 C)   Ht _0  (1.778 m)   Wt 188 lb (85.3 kg)   SpO2 99%   BMI 26.98 kg/m    Subjective:   Patient ID: [name], male    DOB: 1935-10-20, 86 y.o.   MRN: [id number]  HPI: [name] is a 86 y.o. male presenting on 03/09/2022 for Medical Management of Chronic Issues, Hypertension, and Hyperlipidemia   HPI Hypertension Patient is currently on amlodipine-olmesartan, and their blood pressure today is 135/67. Patient denies any lightheadedness or dizziness. Patient denies headaches, blurred vision, chest pains, shortness of breath, or weakness. Denies any side effects from medication and is content with current medication.   Hyperlipidemia and CVD and CAD Patient is coming in for recheck of his hyperlipidemia. The patient is currently taking Repatha. They deny any issues with myalgias or history of liver damage from it. They deny any focal numbness or weakness or chest pain.   Insomnia recheck Patient is coming in for insomnia recheck.  Patient takes quetiapine to help with this.  Seroquel, he is taking the 100 mg now and he thinks it is doing very well.  Overactive bladder recheck Patient takes Myrbetriq for overactive bladder.  He has a history of prostatectomy in the system Myrbetriq helped some but he still has a lot of issues with leakage and overflow.  Relevant past medical, surgical, family and social history reviewed and updated as indicated. Interim medical history since our last visit reviewed. Allergies and medications reviewed and updated.  Review of Systems  Constitutional:  Negative for chills and fever.  Eyes:  Negative for discharge.  Respiratory:  Negative for shortness of breath and wheezing.   Cardiovascular:  Negative for chest pain and leg swelling.  Musculoskeletal:  Negative for back pain and gait problem.  Skin:  Negative for rash.  Neurological:  Negative for dizziness, weakness and numbness.  All  other systems reviewed and are negative.   Per HPI unless specifically indicated above   Allergies as of 03/09/2022       Reactions   Lamisil [terbinafine Hcl] Other (See Comments)   Loss of taste         Medication List        Accurate as of March 09, 2022  8:27 AM. If you have any questions, ask your nurse or doctor.          STOP taking these medications    amLODipine-valsartan 10-320 MG tablet Commonly known as: EXFORGE Stopped by: Fransisca Kaufmann Jessicamarie Amiri, MD       TAKE these medications    albuterol 108 (90 Base) MCG/ACT inhaler Commonly known as: ProAir HFA 2 puffs every 4 hours as needed only  if your can't catch your breath   amLODipine-olmesartan 10-40 MG tablet Commonly known as: AZOR Take 1 tablet by mouth daily.   aspirin 81 MG tablet Take 81 mg by mouth 2 (two) times daily.   cetirizine 10 MG tablet Commonly known as: ZYRTEC Take 1 tablet (10 mg total) by mouth daily.   IRON COMPLEX PO Take 60 mg by mouth daily.   mirabegron ER 50 MG Tb24 tablet Commonly known as: Myrbetriq Take 1 tablet (50 mg total) by mouth daily.   pantoprazole 40 MG tablet Commonly known as: PROTONIX Take 1 tablet (40 mg total) by mouth daily. TAKE ONE TABLET BY MOUTH EVERY DAY 30-60 MINUTES BEFORE FIRST MEAL OF THE DAY.   QUEtiapine  100 MG tablet Commonly known as: SEROQUEL Take 1 tablet (100 mg total) by mouth at bedtime. What changed:  medication strength how much to take Changed by: Worthy Rancher, MD   Repatha SureClick 742 MG/ML Soaj Generic drug: Evolocumab INJECT 140 MG INTO THE SKIN EVERY 14 DAYS   Stiolto Respimat 2.5-2.5 MCG/ACT Aers Generic drug: Tiotropium Bromide-Olodaterol Inhale 2 puffs into the lungs daily.   Vitamin D3 125 MCG (5000 UT) Caps Take 1 capsule (5,000 Units total) by mouth daily.   vitamin E 45 MG (100 UNITS) capsule Take 100 Units by mouth daily.         Objective:   BP 135/67   Pulse 70   Temp 97.6 F (36.4  C)   Ht _0  (1.778 m)   Wt 188 lb (85.3 kg)   SpO2 99%   BMI 26.98 kg/m   Wt Readings from Last 3 Encounters:  03/09/22 188 lb (85.3 kg)  11/24/21 189 lb 6.4 oz (85.9 kg)  09/13/21 190 lb (86.2 kg)    Physical Exam Vitals and nursing note reviewed.  Constitutional:      General: He is not in acute distress.    Appearance: He is well-developed. He is not diaphoretic.  Eyes:     General: No scleral icterus.    Conjunctiva/sclera: Conjunctivae normal.  Neck:     Thyroid: No thyromegaly.  Cardiovascular:     Rate and Rhythm: Normal rate and regular rhythm.     Heart sounds: Normal heart sounds. No murmur heard. Pulmonary:     Effort: Pulmonary effort is normal. No respiratory distress.     Breath sounds: Normal breath sounds. No wheezing.  Musculoskeletal:        General: No swelling. Normal range of motion.     Cervical back: Neck supple.  Lymphadenopathy:     Cervical: No cervical adenopathy.  Skin:    General: Skin is warm and dry.     Findings: No rash.  Neurological:     Mental Status: He is alert and oriented to person, place, and time.     Coordination: Coordination normal.  Psychiatric:        Behavior: Behavior normal.       Assessment & Plan:   Problem List Items Addressed This Visit       Cardiovascular and Mediastinum   HTN (hypertension), benign   Relevant Medications   amLODipine-olmesartan (AZOR) 10-40 MG tablet   Other Relevant Orders   CBC with Differential/Platelet   CMP14+EGFR   Lipid panel   Coronary atherosclerosis   Relevant Medications   amLODipine-olmesartan (AZOR) 10-40 MG tablet   CEREBROVASCULAR DISEASE   Relevant Medications   amLODipine-olmesartan (AZOR) 10-40 MG tablet     Genitourinary   Overactive bladder   Relevant Medications   mirabegron ER (MYRBETRIQ) 50 MG TB24 tablet     Other   Hyperlipidemia LDL goal <100 - Primary   Relevant Medications   amLODipine-olmesartan (AZOR) 10-40 MG tablet    Psychophysiological insomnia   Relevant Medications   QUEtiapine (SEROQUEL) 100 MG tablet    Blood pressure looks good, he seems to be doing okay, bladder issue is his main concern.  No changes today.  Will look for blood work Follow up plan: Return in about 6 months (around 09/08/2022), or if symptoms worsen or fail to improve, for Physical hypertension and hyperlipidemia recheck.  Counseling provided for all of the vaccine components Orders Placed This Encounter  Procedures   CBC with  Differential/Platelet   CMP14+EGFR   Lipid panel    Caryl Pina, MD [facility] 03/09/2022, 8:27 AM

## 2022-03-10 LAB — CMP14+EGFR
ALT: 15 IU/L (ref 0–44)
AST: 15 IU/L (ref 0–40)
Albumin/Globulin Ratio: 2 (ref 1.2–2.2)
Albumin: 4.3 g/dL (ref 3.7–4.7)
Alkaline Phosphatase: 81 IU/L (ref 44–121)
BUN/Creatinine Ratio: 9 — ABNORMAL LOW (ref 10–24)
BUN: 11 mg/dL (ref 8–27)
Bilirubin Total: 0.2 mg/dL (ref 0.0–1.2)
CO2: 23 mmol/L (ref 20–29)
Calcium: 10.1 mg/dL (ref 8.6–10.2)
Chloride: 104 mmol/L (ref 96–106)
Creatinine, Ser: 1.19 mg/dL (ref 0.76–1.27)
Globulin, Total: 2.2 g/dL (ref 1.5–4.5)
Glucose: 124 mg/dL — ABNORMAL HIGH (ref 70–99)
Potassium: 3.9 mmol/L (ref 3.5–5.2)
Sodium: 140 mmol/L (ref 134–144)
Total Protein: 6.5 g/dL (ref 6.0–8.5)
eGFR: 60 mL/min/{1.73_m2} (ref >59)

## 2022-03-10 LAB — CBC WITH DIFFERENTIAL/PLATELET
Basophils Absolute: 0.1 10*3/uL (ref 0.0–0.2)
Basos: 1 %
EOS (ABSOLUTE): 0.6 10*3/uL — ABNORMAL HIGH (ref 0.0–0.4)
Eos: 8 %
Hematocrit: 38.4 % (ref 37.5–51.0)
Hemoglobin: 12.7 g/dL — ABNORMAL LOW (ref 13.0–17.7)
Immature Grans (Abs): 0 10*3/uL (ref 0.0–0.1)
Immature Granulocytes: 1 %
Lymphocytes Absolute: 2.2 10*3/uL (ref 0.7–3.1)
Lymphs: 31 %
MCH: 30.2 pg (ref 26.6–33.0)
MCHC: 33.1 g/dL (ref 31.5–35.7)
MCV: 91 fL (ref 79–97)
Monocytes Absolute: 0.5 10*3/uL (ref 0.1–0.9)
Monocytes: 7 %
Neutrophils Absolute: 3.6 10*3/uL (ref 1.4–7.0)
Neutrophils: 52 %
Platelets: 195 10*3/uL (ref 150–450)
RBC: 4.21 x10E6/uL (ref 4.14–5.80)
RDW: 13.1 % (ref 11.6–15.4)
WBC: 6.9 10*3/uL (ref 3.4–10.8)

## 2022-03-10 LAB — LIPID PANEL
Chol/HDL Ratio: 4.4 ratio (ref 0.0–5.0)
Cholesterol, Total: 150 mg/dL (ref 100–199)
HDL: 34 mg/dL — ABNORMAL LOW (ref >39)
LDL Chol Calc (NIH): 55 mg/dL (ref 0–99)
Triglycerides: 403 mg/dL — ABNORMAL HIGH (ref 0–149)
VLDL Cholesterol Cal: 61 mg/dL — ABNORMAL HIGH (ref 5–40)

## 2022-04-25 DIAGNOSIS — X32XXXA Exposure to sunlight, initial encounter: Secondary | ICD-10-CM | POA: Diagnosis not present

## 2022-04-25 DIAGNOSIS — L570 Actinic keratosis: Secondary | ICD-10-CM | POA: Diagnosis not present

## 2022-04-28 ENCOUNTER — Encounter: Payer: Self-pay | Admitting: *Deleted

## 2022-07-23 ENCOUNTER — Other Ambulatory Visit: Payer: Self-pay | Admitting: Internal Medicine

## 2022-09-08 ENCOUNTER — Ambulatory Visit: Payer: Medicare Other | Admitting: Family Medicine

## 2022-09-14 ENCOUNTER — Ambulatory Visit (INDEPENDENT_AMBULATORY_CARE_PROVIDER_SITE_OTHER): Payer: Medicare Other | Admitting: Family Medicine

## 2022-09-14 ENCOUNTER — Encounter: Payer: Self-pay | Admitting: Family Medicine

## 2022-09-14 VITALS — BP 119/69 | HR 80 | Ht 70.0 in | Wt 186.0 lb

## 2022-09-14 DIAGNOSIS — Z8546 Personal history of malignant neoplasm of prostate: Secondary | ICD-10-CM

## 2022-09-14 DIAGNOSIS — E785 Hyperlipidemia, unspecified: Secondary | ICD-10-CM | POA: Diagnosis not present

## 2022-09-14 DIAGNOSIS — J449 Chronic obstructive pulmonary disease, unspecified: Secondary | ICD-10-CM

## 2022-09-14 DIAGNOSIS — Z125 Encounter for screening for malignant neoplasm of prostate: Secondary | ICD-10-CM | POA: Diagnosis not present

## 2022-09-14 DIAGNOSIS — M159 Polyosteoarthritis, unspecified: Secondary | ICD-10-CM | POA: Diagnosis not present

## 2022-09-14 DIAGNOSIS — M150 Primary generalized (osteo)arthritis: Secondary | ICD-10-CM

## 2022-09-14 DIAGNOSIS — I10 Essential (primary) hypertension: Secondary | ICD-10-CM | POA: Diagnosis not present

## 2022-09-14 LAB — CMP14+EGFR

## 2022-09-14 LAB — CBC WITH DIFFERENTIAL/PLATELET
Basophils Absolute: 0.1 10*3/uL (ref 0.0–0.2)
Basos: 1 %
EOS (ABSOLUTE): 0.4 10*3/uL (ref 0.0–0.4)
Eos: 6 %
Hematocrit: 42.2 % (ref 37.5–51.0)
MCH: 30.5 pg (ref 26.6–33.0)
MCV: 92 fL (ref 79–97)
Monocytes: 6 %

## 2022-09-14 LAB — LIPID PANEL

## 2022-09-14 MED ORDER — DICLOFENAC SODIUM 75 MG PO TBEC
75.0000 mg | DELAYED_RELEASE_TABLET | Freq: Two times a day (BID) | ORAL | 3 refills | Status: DC | PRN
Start: 2022-09-14 — End: 2022-09-14

## 2022-09-14 MED ORDER — DICLOFENAC SODIUM 75 MG PO TBEC: 75.0000 mg | DELAYED_RELEASE_TABLET | Freq: Two times a day (BID) | ORAL | 0 refills | Status: DC | PRN

## 2022-09-14 NOTE — Progress Notes (Signed)
BP 119/69   Pulse 80   Ht 5\' 10"  (1.778 m)   Wt 186 lb (84.4 kg)   SpO2 96%   BMI 26.69 kg/m    Subjective:   Patient ID: [name], male    DOB: 1935-11-24, 87 y.o.   MRN: [id number]  HPI: [name] is a 87 y.o. male presenting on 09/14/2022 for Medical Management of Chronic Issues (CPE) and Joint Pain (Bilateral hips, knees, elbows)   HPI Physical exam Patient denies any chest pain, shortness of breath, headaches or vision issues, abdominal complaints, diarrhea, nausea, vomiting, or joint issues.   Hypertension Patient is currently on amlodipine-olmesartan, and their blood pressure today is 119/69. Patient denies any lightheadedness or dizziness. Patient denies headaches, blurred vision, chest pains, shortness of breath, or weakness. Denies any side effects from medication and is content with current medication.   Hyperlipidemia Patient is coming in for recheck of his hyperlipidemia. The patient is currently taking Repatha. They deny any issues with myalgias or history of liver damage from it. They deny any focal numbness or weakness or chest pain.   COPD Patient is coming in for COPD recheck today.  He is currently on Stiolto and albuterol.  He has a mild chronic cough but denies any major coughing spells or wheezing spells.  He has 0nighttime symptoms per week and 0daytime symptoms per week currently.   Relevant past medical, surgical, family and social history reviewed and updated as indicated. Interim medical history since our last visit reviewed. Allergies and medications reviewed and updated.  Review of Systems  Constitutional:  Negative for chills and fever.  HENT:  Negative for ear pain and tinnitus.   Eyes:  Negative for pain and discharge.  Respiratory:  Negative for cough, shortness of breath and wheezing.   Cardiovascular:  Negative for chest pain, palpitations and leg swelling.  Gastrointestinal:  Negative for abdominal pain, blood in stool,  constipation and diarrhea.  Genitourinary:  Positive for frequency. Negative for dysuria and hematuria.  Musculoskeletal:  Negative for back pain, gait problem and myalgias.  Skin:  Negative for rash.  Neurological:  Negative for dizziness, weakness and headaches.  Psychiatric/Behavioral:  Negative for suicidal ideas.   All other systems reviewed and are negative.   Per HPI unless specifically indicated above   Allergies as of 09/14/2022       Reactions   Lamisil [terbinafine Hcl] Other (See Comments)   Loss of taste         Medication List        Accurate as of September 14, 2022  8:16 AM. If you have any questions, ask your nurse or doctor.          albuterol 108 (90 Base) MCG/ACT inhaler Commonly known as: ProAir HFA 2 puffs every 4 hours as needed only  if your can't catch your breath   amLODipine-olmesartan 10-40 MG tablet Commonly known as: AZOR Take 1 tablet by mouth daily.   aspirin 81 MG tablet Take 81 mg by mouth 2 (two) times daily.   cetirizine 10 MG tablet Commonly known as: ZYRTEC Take 1 tablet (10 mg total) by mouth daily.   diclofenac 75 MG EC tablet Commonly known as: VOLTAREN Take 1 tablet (75 mg total) by mouth 2 (two) times daily as needed. Started by: Elige Radon Cedrick Partain, MD   IRON COMPLEX PO Take 60 mg by mouth daily.   mirabegron ER 50 MG Tb24 tablet Commonly known as: Myrbetriq Take 1 tablet (50 mg  total) by mouth daily.   pantoprazole 40 MG tablet Commonly known as: PROTONIX TAKE 1 TABLET DAILY 30 TO 60 MINUTES BEFORE FIRST MEAL OF THE DAY   QUEtiapine 100 MG tablet Commonly known as: SEROQUEL Take 1 tablet (100 mg total) by mouth at bedtime.   Repatha SureClick 140 MG/ML Soaj Generic drug: Evolocumab INJECT 140 MG INTO THE SKIN EVERY 14 DAYS   Stiolto Respimat 2.5-2.5 MCG/ACT Aers Generic drug: Tiotropium Bromide-Olodaterol Inhale 2 puffs into the lungs daily.   Vitamin D3 125 MCG (5000 UT) Caps Take 1 capsule (5,000 Units  total) by mouth daily.   vitamin E 45 MG (100 UNITS) capsule Take 100 Units by mouth daily.         Objective:   BP 119/69   Pulse 80   Ht 5\' 10"  (1.778 m)   Wt 186 lb (84.4 kg)   SpO2 96%   BMI 26.69 kg/m   Wt Readings from Last 3 Encounters:  09/14/22 186 lb (84.4 kg)  03/09/22 188 lb (85.3 kg)  11/24/21 189 lb 6.4 oz (85.9 kg)    Physical Exam Vitals and nursing note reviewed.  Constitutional:      General: He is not in acute distress.    Appearance: Normal appearance. He is well-developed. He is not diaphoretic.  HENT:     Right Ear: External ear normal.     Left Ear: External ear normal.     Nose: Nose normal.     Mouth/Throat:     Pharynx: No oropharyngeal exudate.  Eyes:     General: No scleral icterus.       Right eye: No discharge.     Conjunctiva/sclera: Conjunctivae normal.     Pupils: Pupils are equal, round, and reactive to light.  Neck:     Thyroid: No thyromegaly.  Cardiovascular:     Rate and Rhythm: Normal rate and regular rhythm.     Heart sounds: Normal heart sounds. No murmur heard. Pulmonary:     Effort: Pulmonary effort is normal. No respiratory distress.     Breath sounds: Normal breath sounds. No wheezing.  Abdominal:     General: Bowel sounds are normal. There is no distension.     Palpations: Abdomen is soft.     Tenderness: There is no abdominal tenderness. There is no guarding or rebound.  Musculoskeletal:        General: Normal range of motion.     Cervical back: Neck supple.  Lymphadenopathy:     Cervical: No cervical adenopathy.  Skin:    General: Skin is warm and dry.     Findings: No rash.  Neurological:     Mental Status: He is alert and oriented to person, place, and time.     Coordination: Coordination normal.  Psychiatric:        Behavior: Behavior normal.       Assessment & Plan:   Problem List Items Addressed This Visit       Cardiovascular and Mediastinum   HTN (hypertension), benign   Relevant Orders    CBC with Differential/Platelet   CMP14+EGFR   Lipid panel     Respiratory   COPD GOLD II   Relevant Orders   CBC with Differential/Platelet   CMP14+EGFR   Lipid panel     Other   Hyperlipidemia LDL goal <100 - Primary   Relevant Orders   CBC with Differential/Platelet   CMP14+EGFR   Lipid panel   History of prostate cancer  Relevant Orders   PSA, total and free   Other Visit Diagnoses     Prostate cancer screening       Relevant Orders   PSA, total and free   Primary osteoarthritis involving multiple joints       Relevant Medications   diclofenac (VOLTAREN) 75 MG EC tablet       Continue current medicine, seems to be doing well except urinary issues.  He does complain of some arthritic his joints of his knees and hips and sometimes his hands that comes and goes.  Will give him Voltaren that he can use Follow up plan: Return in about 6 months (around 03/16/2023), or if symptoms worsen or fail to improve, for Hypertension and hyperlipidemia and COPD recheck.  Counseling provided for all of the vaccine components Orders Placed This Encounter  Procedures   CBC with Differential/Platelet   CMP14+EGFR   Lipid panel   PSA, total and free    Arville Care, MD [facility] 09/14/2022, 8:16 AM

## 2022-09-14 NOTE — Addendum Note (Signed)
Addended by: Arville Care on: 09/14/2022 08:19 AM   Modules accepted: Orders

## 2022-09-15 LAB — CBC WITH DIFFERENTIAL/PLATELET
Hemoglobin: 14 g/dL (ref 13.0–17.7)
Immature Grans (Abs): 0 10*3/uL (ref 0.0–0.1)
Immature Granulocytes: 0 %
Lymphocytes Absolute: 2.2 10*3/uL (ref 0.7–3.1)
Lymphs: 31 %
MCHC: 33.2 g/dL (ref 31.5–35.7)
Monocytes Absolute: 0.5 10*3/uL (ref 0.1–0.9)
Neutrophils Absolute: 3.9 10*3/uL (ref 1.4–7.0)
Neutrophils: 56 %
Platelets: 184 10*3/uL (ref 150–450)
RBC: 4.59 x10E6/uL (ref 4.14–5.80)
RDW: 13.4 % (ref 11.6–15.4)
WBC: 7 10*3/uL (ref 3.4–10.8)

## 2022-09-15 LAB — CMP14+EGFR
ALT: 12 IU/L (ref 0–44)
Albumin: 4.4 g/dL (ref 3.7–4.7)
Alkaline Phosphatase: 74 IU/L (ref 44–121)
BUN/Creatinine Ratio: 10 (ref 10–24)
Bilirubin Total: 0.3 mg/dL (ref 0.0–1.2)
Calcium: 9.4 mg/dL (ref 8.6–10.2)
Chloride: 107 mmol/L — ABNORMAL HIGH (ref 96–106)
Creatinine, Ser: 1.25 mg/dL (ref 0.76–1.27)
Glucose: 125 mg/dL — ABNORMAL HIGH (ref 70–99)
Sodium: 142 mmol/L (ref 134–144)
Total Protein: 6.5 g/dL (ref 6.0–8.5)

## 2022-09-15 LAB — LIPID PANEL
Cholesterol, Total: 149 mg/dL (ref 100–199)
HDL: 34 mg/dL — ABNORMAL LOW (ref >39)
LDL Chol Calc (NIH): 72 mg/dL (ref 0–99)
VLDL Cholesterol Cal: 43 mg/dL — ABNORMAL HIGH (ref 5–40)

## 2022-09-15 LAB — PSA, TOTAL AND FREE
PSA, Free: <0.02 ng/mL
Prostate Specific Ag, Serum: <0.1 ng/mL (ref 0.0–4.0)

## 2022-10-24 DIAGNOSIS — L570 Actinic keratosis: Secondary | ICD-10-CM | POA: Diagnosis not present

## 2022-10-24 DIAGNOSIS — X32XXXD Exposure to sunlight, subsequent encounter: Secondary | ICD-10-CM | POA: Diagnosis not present

## 2022-12-17 ENCOUNTER — Other Ambulatory Visit: Payer: Self-pay | Admitting: Internal Medicine

## 2022-12-29 ENCOUNTER — Other Ambulatory Visit: Payer: Self-pay | Admitting: Family Medicine

## 2022-12-29 DIAGNOSIS — F5104 Psychophysiologic insomnia: Secondary | ICD-10-CM

## 2023-02-28 ENCOUNTER — Other Ambulatory Visit: Payer: Self-pay

## 2023-02-28 ENCOUNTER — Other Ambulatory Visit: Payer: Self-pay | Admitting: Cardiology

## 2023-02-28 ENCOUNTER — Other Ambulatory Visit (HOSPITAL_COMMUNITY): Payer: Self-pay

## 2023-02-28 DIAGNOSIS — E785 Hyperlipidemia, unspecified: Secondary | ICD-10-CM

## 2023-02-28 MED ORDER — REPATHA SURECLICK 140 MG/ML ~~LOC~~ SOAJ
SUBCUTANEOUS | 0 refills | Status: DC
Start: 2023-02-28 — End: 2023-03-01
  Filled 2023-02-28: qty 2, 28d supply, fill #0

## 2023-02-28 NOTE — Telephone Encounter (Signed)
Pt is requesting a refill of Repatha

## 2023-02-28 NOTE — Telephone Encounter (Signed)
*  STAT* If patient is at the pharmacy, call can be transferred to refill team.   1. Which medications need to be refilled? (please list name of each medication and dose if known) Repatha Sure Click   2. Would you like to learn more about the convenience, safety, & potential cost savings by using the [facility]?    3. Are you open to using the [facility] (Type [facility].   4. Which pharmacy/location (including street and city if local pharmacy) is medication to be sent to?[facility]   5. Do they need a 30 day or 90 day supply?

## 2023-03-01 ENCOUNTER — Other Ambulatory Visit: Payer: Self-pay | Admitting: Cardiology

## 2023-03-01 ENCOUNTER — Other Ambulatory Visit (HOSPITAL_COMMUNITY): Payer: Self-pay

## 2023-03-01 DIAGNOSIS — E785 Hyperlipidemia, unspecified: Secondary | ICD-10-CM

## 2023-03-01 MED ORDER — REPATHA SURECLICK 140 MG/ML ~~LOC~~ SOAJ
SUBCUTANEOUS | 3 refills | Status: DC
Start: 2023-03-01 — End: 2023-03-02

## 2023-03-01 NOTE — Telephone Encounter (Signed)
*  STAT* If patient is at the pharmacy, call can be transferred to refill team.   1. Which medications need to be refilled? (please list name of each medication and dose if known)  Evolocumab (REPATHA SURECLICK) 140 MG/ML SOAJ  2. Which pharmacy/location (including street and city if local pharmacy) is medication to be sent to? [facility] - [place], [place] - [address]  3. Do they need a 30 day or 90 day supply?   Patient states his prescription was sent to the incorrect pharmacy and he would like to have it transferred to [facility].

## 2023-03-01 NOTE — Telephone Encounter (Signed)
Pt requests a refill of Repatha and wants [facility]

## 2023-03-02 ENCOUNTER — Other Ambulatory Visit: Payer: Self-pay | Admitting: Pharmacist Clinician (PhC)/ Clinical Pharmacy Specialist

## 2023-03-02 DIAGNOSIS — E785 Hyperlipidemia, unspecified: Secondary | ICD-10-CM

## 2023-03-02 MED ORDER — REPATHA SURECLICK 140 MG/ML ~~LOC~~ SOAJ: SUBCUTANEOUS | 0 refills | Status: DC

## 2023-03-20 ENCOUNTER — Encounter: Payer: Self-pay | Admitting: Family Medicine

## 2023-03-20 ENCOUNTER — Ambulatory Visit: Payer: Medicare Other | Admitting: Family Medicine

## 2023-03-20 VITALS — BP 120/66 | HR 85 | Temp 98.2°F | Ht 70.0 in | Wt 184.0 lb

## 2023-03-20 DIAGNOSIS — F5104 Psychophysiologic insomnia: Secondary | ICD-10-CM

## 2023-03-20 DIAGNOSIS — I10 Essential (primary) hypertension: Secondary | ICD-10-CM

## 2023-03-20 DIAGNOSIS — M1611 Unilateral primary osteoarthritis, right hip: Secondary | ICD-10-CM | POA: Diagnosis not present

## 2023-03-20 DIAGNOSIS — R413 Other amnesia: Secondary | ICD-10-CM | POA: Diagnosis not present

## 2023-03-20 DIAGNOSIS — I2510 Atherosclerotic heart disease of native coronary artery without angina pectoris: Secondary | ICD-10-CM

## 2023-03-20 DIAGNOSIS — J449 Chronic obstructive pulmonary disease, unspecified: Secondary | ICD-10-CM | POA: Diagnosis not present

## 2023-03-20 DIAGNOSIS — Z23 Encounter for immunization: Secondary | ICD-10-CM

## 2023-03-20 DIAGNOSIS — I679 Cerebrovascular disease, unspecified: Secondary | ICD-10-CM

## 2023-03-20 DIAGNOSIS — N3281 Overactive bladder: Secondary | ICD-10-CM

## 2023-03-20 DIAGNOSIS — Z789 Other specified health status: Secondary | ICD-10-CM | POA: Diagnosis not present

## 2023-03-20 DIAGNOSIS — E785 Hyperlipidemia, unspecified: Secondary | ICD-10-CM | POA: Diagnosis not present

## 2023-03-20 LAB — LIPID PANEL

## 2023-03-20 MED ORDER — CETIRIZINE HCL 10 MG PO TABS: 10.0000 mg | ORAL_TABLET | Freq: Every day | ORAL | 3 refills | Status: DC

## 2023-03-20 MED ORDER — QUETIAPINE FUMARATE 100 MG PO TABS: 100.0000 mg | ORAL_TABLET | Freq: Every day | ORAL | 1 refills | Status: DC

## 2023-03-20 MED ORDER — REPATHA SURECLICK 140 MG/ML ~~LOC~~ SOAJ: 140.0000 mg | SUBCUTANEOUS | 3 refills | Status: DC

## 2023-03-20 MED ORDER — DONEPEZIL HCL 10 MG PO TABS: 10.0000 mg | ORAL_TABLET | Freq: Every day | ORAL | 3 refills | Status: DC

## 2023-03-20 MED ORDER — AMLODIPINE-OLMESARTAN 10-40 MG PO TABS: 1.0000 | ORAL_TABLET | Freq: Every day | ORAL | 3 refills | Status: DC

## 2023-03-20 MED ORDER — MIRABEGRON ER 50 MG PO TB24: 50.0000 mg | ORAL_TABLET | Freq: Every day | ORAL | 3 refills | Status: DC

## 2023-03-20 MED ORDER — VITAMIN D3 125 MCG (5000 UT) PO CAPS: 5000.0000 [IU] | ORAL_CAPSULE | Freq: Every day | ORAL | 3 refills | Status: DC

## 2023-03-20 MED ORDER — STIOLTO RESPIMAT 2.5-2.5 MCG/ACT IN AERS: 2.0000 | INHALATION_SPRAY | Freq: Every day | RESPIRATORY_TRACT | 3 refills | Status: DC

## 2023-03-20 MED ORDER — DONEPEZIL HCL 10 MG PO TABS: 10.0000 mg | ORAL_TABLET | Freq: Every day | ORAL | 0 refills | Status: DC

## 2023-03-20 NOTE — Progress Notes (Signed)
BP 120/66   Pulse 85   Temp 98.2 F (36.8 C)   Ht 5\' 10"  (1.778 m)   Wt 184 lb (83.5 kg)   SpO2 97%   BMI 26.40 kg/m    Subjective:   Patient ID: [name], male    DOB: 1935-05-13, 87 y.o.   MRN: [id number]  HPI: [name] is a 87 y.o. male presenting on 03/20/2023 for Medical Management of Chronic Issues (6 month follow up/Right hip - referral to ortho)   HPI Hypertension Patient is currently on amlodipine-olmesartan, and their blood pressure today is 120/66. Patient denies any lightheadedness or dizziness. Patient denies headaches, blurred vision, chest pains, shortness of breath, or weakness. Denies any side effects from medication and is content with current medication.   Hyperlipidemia and cerebrovascular disease and coronary atherosclerosis Patient is coming in for recheck of his hyperlipidemia. The patient is currently taking Repatha, has been intolerant of statins. They deny any issues with myalgias or history of liver damage from it. They deny any focal numbness or weakness or chest pain.   COPD Patient is coming in for COPD recheck today.  He is currently on albuterol and Stiolto.  He has a mild chronic cough but denies any major coughing spells or wheezing spells.  He has 0 nighttime symptoms per week and 0 daytime symptoms per week currently.   Insomnia recheck Patient is coming in for insomnia recheck.  Patient currently takes Seroquel.  He feels like he is doing really well with the sleep medicine and denies any major side effects.    03/20/2023    7:54 AM 09/14/2022    8:01 AM 09/14/2022    8:00 AM 03/09/2022    8:12 AM 09/13/2021   10:20 AM  Depression screen PHQ 2/9  Decreased Interest 0  1 0 0  Down, Depressed, Hopeless 0  0 0 0  PHQ - 2 Score 0  1 0 0  Altered sleeping  0  1 1  Tired, decreased energy  2  1 1   Change in appetite  2  1 1   Feeling bad or failure about yourself   0  0 0  Trouble concentrating  0  0 0  Moving slowly or  fidgety/restless  0  0 0  Suicidal thoughts  0  0 0  PHQ-9 Score    3 3  Difficult doing work/chores  Somewhat difficult  Not difficult at all     Patient does complain of his right hip still bothering him, he has been diagnosed with osteoarthritis in that hip and says it is to the point where he wants to go discuss it with an orthopedic.  He does not currently have 1.  It does hurt him most of the time but is more stiff in the mornings but also stiff with overuse.  The pain is both lateral and anterior on the hip.  Patient says he has been having some mild memory issues.  He says sometimes he will be going to do something and easily forget it and he says it has been happening more often.  He does want to try medicine for memory and see if it helps.  Relevant past medical, surgical, family and social history reviewed and updated as indicated. Interim medical history since our last visit reviewed. Allergies and medications reviewed and updated.  Review of Systems  Constitutional:  Negative for chills and fever.  Eyes:  Negative for visual disturbance.  Respiratory:  Negative for shortness  of breath and wheezing.   Cardiovascular:  Negative for chest pain and leg swelling.  Musculoskeletal:  Positive for arthralgias and myalgias. Negative for back pain and gait problem.  Skin:  Negative for rash.  Psychiatric/Behavioral:  Positive for sleep disturbance. Negative for self-injury and suicidal ideas.   All other systems reviewed and are negative.   Per HPI unless specifically indicated above   Allergies as of 03/20/2023       Reactions   Lamisil [terbinafine Hcl] Other (See Comments)   Loss of taste         Medication List        Accurate as of March 20, 2023  8:28 AM. If you have any questions, ask your nurse or doctor.          albuterol 108 (90 Base) MCG/ACT inhaler Commonly known as: ProAir HFA 2 puffs every 4 hours as needed only  if your can't catch your breath    amLODipine-olmesartan 10-40 MG tablet Commonly known as: AZOR Take 1 tablet by mouth daily.   aspirin 81 MG tablet Take 81 mg by mouth 2 (two) times daily.   cetirizine 10 MG tablet Commonly known as: ZYRTEC Take 1 tablet (10 mg total) by mouth daily.   diclofenac 75 MG EC tablet Commonly known as: VOLTAREN Take 1 tablet (75 mg total) by mouth 2 (two) times daily as needed.   donepezil 10 MG tablet Commonly known as: ARICEPT Take 1 tablet (10 mg total) by mouth at bedtime. Started by: Elige Radon Tayron Hunnell   IRON COMPLEX PO Take 60 mg by mouth daily.   mirabegron ER 50 MG Tb24 tablet Commonly known as: Myrbetriq Take 1 tablet (50 mg total) by mouth daily.   pantoprazole 40 MG tablet Commonly known as: PROTONIX TAKE 1 TABLET DAILY 30 TO 60 MINUTES BEFORE FIRST MEAL OF THE DAY   QUEtiapine 100 MG tablet Commonly known as: SEROQUEL Take 1 tablet (100 mg total) by mouth at bedtime.   Repatha SureClick 140 MG/ML Soaj Generic drug: Evolocumab Inject 140 mg into the skin every 14 (fourteen) days. Inject 1 pen subcutaneously every 14 days.  Needs appt for further fills What changed:  how much to take how to take this when to take this Changed by: Elige Radon Zai Chmiel   Stiolto Respimat 2.5-2.5 MCG/ACT Aers Generic drug: Tiotropium Bromide-Olodaterol Inhale 2 Inhalations into the lungs daily. What changed: See the new instructions. Changed by: Elige Radon Lashundra Shiveley   Vitamin D3 125 MCG (5000 UT) Caps Take 1 capsule (5,000 Units total) by mouth daily.   vitamin E 45 MG (100 UNITS) capsule Take 100 Units by mouth daily.         Objective:   BP 120/66   Pulse 85   Temp 98.2 F (36.8 C)   Ht 5\' 10"  (1.778 m)   Wt 184 lb (83.5 kg)   SpO2 97%   BMI 26.40 kg/m   Wt Readings from Last 3 Encounters:  03/20/23 184 lb (83.5 kg)  09/14/22 186 lb (84.4 kg)  03/09/22 188 lb (85.3 kg)    Physical Exam Vitals and nursing note reviewed.  Constitutional:      General:  He is not in acute distress.    Appearance: He is well-developed. He is not diaphoretic.  Eyes:     General: No scleral icterus.    Conjunctiva/sclera: Conjunctivae normal.  Neck:     Thyroid: No thyromegaly.  Cardiovascular:     Rate and Rhythm: Normal rate  and regular rhythm.     Heart sounds: Normal heart sounds. No murmur heard. Pulmonary:     Effort: Pulmonary effort is normal. No respiratory distress.     Breath sounds: Normal breath sounds. No wheezing or rhonchi.  Musculoskeletal:        General: No swelling.     Cervical back: Neck supple.  Lymphadenopathy:     Cervical: No cervical adenopathy.  Skin:    General: Skin is warm and dry.     Findings: No rash.  Neurological:     Mental Status: He is alert and oriented to person, place, and time.     Coordination: Coordination normal.  Psychiatric:        Behavior: Behavior normal.        Cognition and Memory: He exhibits impaired recent memory (Mild memory impairment reported by patient).       Assessment & Plan:   Problem List Items Addressed This Visit       Cardiovascular and Mediastinum   HTN (hypertension), benign - Primary   Relevant Medications   amLODipine-olmesartan (AZOR) 10-40 MG tablet   Evolocumab (REPATHA SURECLICK) 140 MG/ML SOAJ   Other Relevant Orders   CBC with Differential/Platelet   CMP14+EGFR   Lipid panel   Coronary atherosclerosis   Relevant Medications   amLODipine-olmesartan (AZOR) 10-40 MG tablet   Evolocumab (REPATHA SURECLICK) 140 MG/ML SOAJ   CEREBROVASCULAR DISEASE   Relevant Medications   amLODipine-olmesartan (AZOR) 10-40 MG tablet   Evolocumab (REPATHA SURECLICK) 140 MG/ML SOAJ     Respiratory   COPD GOLD II   Relevant Medications   cetirizine (ZYRTEC) 10 MG tablet   Tiotropium Bromide-Olodaterol (STIOLTO RESPIMAT) 2.5-2.5 MCG/ACT AERS     Genitourinary   Overactive bladder   Relevant Medications   mirabegron ER (MYRBETRIQ) 50 MG TB24 tablet     Other    Hyperlipidemia LDL goal <100   Relevant Medications   amLODipine-olmesartan (AZOR) 10-40 MG tablet   Evolocumab (REPATHA SURECLICK) 140 MG/ML SOAJ   Psychophysiological insomnia   Relevant Medications   QUEtiapine (SEROQUEL) 100 MG tablet   Other Visit Diagnoses       Dyslipidemia       Relevant Medications   Evolocumab (REPATHA SURECLICK) 140 MG/ML SOAJ     Statin intolerance         Primary osteoarthritis of right hip       Relevant Orders   Ambulatory referral to Orthopedic Surgery     Memory impairment       Relevant Medications   donepezil (ARICEPT) 10 MG tablet       His cardiologist has signed off on his case and the fastest to take over his Repatha, likely will need prior authorization but he does have history of statin use with myalgias and has been intolerant and has diagnosed both of CVD and coronary atherosclerosis.  Continue current medicine.  Will do blood work today.  Patient wants to start something for memory so we will try Aricept and see how he does with that. Follow up plan: Return in about 6 months (around 09/18/2023), or if symptoms worsen or fail to improve, for Hyperlipidemia and hypertension.  Counseling provided for all of the vaccine components Orders Placed This Encounter  Procedures   CBC with Differential/Platelet   CMP14+EGFR   Lipid panel   Ambulatory referral to Orthopedic Surgery    Arville Care, MD [facility] 03/20/2023, 8:28 AM

## 2023-03-20 NOTE — Addendum Note (Signed)
Addended by: Arville Care on: 03/20/2023 08:56 AM   Modules accepted: Orders

## 2023-03-21 LAB — CBC WITH DIFFERENTIAL/PLATELET
Basophils Absolute: 0.1 10*3/uL (ref 0.0–0.2)
Basos: 1 %
EOS (ABSOLUTE): 0.4 10*3/uL (ref 0.0–0.4)
Eos: 6 %
Hematocrit: 43.3 % (ref 37.5–51.0)
Hemoglobin: 14.3 g/dL (ref 13.0–17.7)
Immature Grans (Abs): 0 10*3/uL (ref 0.0–0.1)
Immature Granulocytes: 1 %
Lymphocytes Absolute: 2.2 10*3/uL (ref 0.7–3.1)
Lymphs: 30 %
MCH: 30.8 pg (ref 26.6–33.0)
MCHC: 33 g/dL (ref 31.5–35.7)
MCV: 93 fL (ref 79–97)
Monocytes Absolute: 0.5 10*3/uL (ref 0.1–0.9)
Monocytes: 6 %
Neutrophils Absolute: 4.2 10*3/uL (ref 1.4–7.0)
Neutrophils: 56 %
Platelets: 196 10*3/uL (ref 150–450)
RBC: 4.64 x10E6/uL (ref 4.14–5.80)
RDW: 13.2 % (ref 11.6–15.4)
WBC: 7.4 10*3/uL (ref 3.4–10.8)

## 2023-03-21 LAB — CMP14+EGFR
ALT: 14 IU/L (ref 0–44)
AST: 16 IU/L (ref 0–40)
Albumin: 4.5 g/dL (ref 3.7–4.7)
Alkaline Phosphatase: 79 IU/L (ref 44–121)
BUN/Creatinine Ratio: 18 (ref 10–24)
BUN: 22 mg/dL (ref 8–27)
Bilirubin Total: 0.2 mg/dL (ref 0.0–1.2)
CO2: 21 mmol/L (ref 20–29)
Calcium: 10.6 mg/dL — ABNORMAL HIGH (ref 8.6–10.2)
Chloride: 103 mmol/L (ref 96–106)
Creatinine, Ser: 1.21 mg/dL (ref 0.76–1.27)
Globulin, Total: 2 g/dL (ref 1.5–4.5)
Glucose: 110 mg/dL — ABNORMAL HIGH (ref 70–99)
Potassium: 3.8 mmol/L (ref 3.5–5.2)
Sodium: 140 mmol/L (ref 134–144)
Total Protein: 6.5 g/dL (ref 6.0–8.5)
eGFR: 59 mL/min/{1.73_m2} — ABNORMAL LOW (ref >59)

## 2023-03-21 LAB — LIPID PANEL
Cholesterol, Total: 134 mg/dL (ref 100–199)
HDL: 37 mg/dL — ABNORMAL LOW (ref >39)
LDL CALC COMMENT:: 3.6 ratio (ref 0.0–5.0)
LDL Chol Calc (NIH): 52 mg/dL (ref 0–99)
Triglycerides: 290 mg/dL — ABNORMAL HIGH (ref 0–149)
VLDL Cholesterol Cal: 45 mg/dL — ABNORMAL HIGH (ref 5–40)

## 2023-03-24 ENCOUNTER — Other Ambulatory Visit: Payer: Self-pay | Admitting: Family Medicine

## 2023-03-24 MED ORDER — FENOFIBRATE 48 MG PO TABS: 48.0000 mg | ORAL_TABLET | Freq: Every day | ORAL | 3 refills | Status: DC

## 2023-04-07 ENCOUNTER — Encounter: Payer: Self-pay | Admitting: *Deleted

## 2023-04-24 DIAGNOSIS — L570 Actinic keratosis: Secondary | ICD-10-CM | POA: Diagnosis not present

## 2023-04-24 DIAGNOSIS — X32XXXD Exposure to sunlight, subsequent encounter: Secondary | ICD-10-CM | POA: Diagnosis not present

## 2023-04-28 ENCOUNTER — Other Ambulatory Visit: Payer: Self-pay | Admitting: Family Medicine

## 2023-04-28 DIAGNOSIS — F5104 Psychophysiologic insomnia: Secondary | ICD-10-CM

## 2023-05-15 DIAGNOSIS — M51369 Other intervertebral disc degeneration, lumbar region without mention of lumbar back pain or lower extremity pain: Secondary | ICD-10-CM | POA: Diagnosis not present

## 2023-05-15 DIAGNOSIS — M25552 Pain in left hip: Secondary | ICD-10-CM | POA: Diagnosis not present

## 2023-05-15 DIAGNOSIS — M545 Low back pain, unspecified: Secondary | ICD-10-CM | POA: Diagnosis not present

## 2023-06-26 ENCOUNTER — Ambulatory Visit (INDEPENDENT_AMBULATORY_CARE_PROVIDER_SITE_OTHER): Admitting: Urology

## 2023-06-26 VITALS — BP 103/60 | HR 40

## 2023-06-26 DIAGNOSIS — N3946 Mixed incontinence: Secondary | ICD-10-CM

## 2023-06-26 DIAGNOSIS — Z8546 Personal history of malignant neoplasm of prostate: Secondary | ICD-10-CM

## 2023-06-26 LAB — URINALYSIS, ROUTINE W REFLEX MICROSCOPIC
Bilirubin, UA: NEGATIVE
Glucose, UA: NEGATIVE
Ketones, UA: NEGATIVE
Leukocytes,UA: NEGATIVE
Nitrite, UA: NEGATIVE
Protein,UA: NEGATIVE
RBC, UA: NEGATIVE
Specific Gravity, UA: 1.01 (ref 1.005–1.030)
Urobilinogen, Ur: 0.2 mg/dL (ref 0.2–1.0)
pH, UA: 6 (ref 5.0–7.5)

## 2023-06-26 NOTE — Progress Notes (Signed)
 06/26/2023 10:04 AM   [name] 08-03-1935 [id number]  Referring provider: Dettinger, Elige Radon, MD [address] [place],  [place] [address]  No chief complaint on file.   HPI: New pt -   1) LUTS - voids with a good stream. No frequency or urgency. Has incontinence without awareness, but also leaks with stress. Not so much urgency and urge incontinence. Was 1 ppd now 2 ppd. They are soaked - heavy. Leaks less at night. Nocturia x 1. On myrbetriq 50 mg.   2) H/o PCa - RRP around 30 yrs ago with Dr. Rito Ehrlich. His PSA was "close to 5 but not 5". Told he had disease in the lymph nodes. His Sep 2024 PSA was undetectable.    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    PMH: Past Medical History:  Diagnosis Date   Adenomatous colon polyp 11/1999   Allergy    Blood transfusion without reported diagnosis    Cancer [facility])    Prostate   Carotid artery occlusion    Cataract    COPD (chronic obstructive pulmonary disease) (HCC)    GERD (gastroesophageal reflux disease)    Hyperlipidemia    Hypertension    Internal hemorrhoids    PVC (premature ventricular contraction)     Surgical History: Past Surgical History:  Procedure Laterality Date   APPENDECTOMY     carotid surgery     CATARACT EXTRACTION     both eyes   COLONOSCOPY     POLYPECTOMY     TRANSURETHRAL RESECTION OF PROSTATE     UPPER GASTROINTESTINAL ENDOSCOPY     UPPER GI ENDOSCOPY  Nov. 3, 2014   Upper endoscopy with diatation    Home Medications:  Allergies as of 06/26/2023       Reactions   Lamisil [terbinafine Hcl] Other (See Comments)   Loss of taste         Medication List        Accurate as of June 26, 2023 10:04 AM. If you have any questions, ask your nurse or doctor.          albuterol 108 (90 Base) MCG/ACT inhaler Commonly known as: ProAir HFA 2 puffs every 4 hours as needed only  if your can't catch your breath   amLODipine-olmesartan 10-40 MG tablet Commonly  known as: AZOR Take 1 tablet by mouth daily.   aspirin 81 MG tablet Take 81 mg by mouth 2 (two) times daily.   cetirizine 10 MG tablet Commonly known as: ZYRTEC Take 1 tablet (10 mg total) by mouth daily.   diclofenac 75 MG EC tablet Commonly known as: VOLTAREN Take 1 tablet (75 mg total) by mouth 2 (two) times daily as needed.   donepezil 10 MG tablet Commonly known as: ARICEPT Take 1 tablet (10 mg total) by mouth at bedtime.   fenofibrate 48 MG tablet Commonly known as: Tricor Take 1 tablet (48 mg total) by mouth daily.   IRON COMPLEX PO Take 60 mg by mouth daily.   mirabegron ER 50 MG Tb24 tablet Commonly known as: Myrbetriq Take 1 tablet (50 mg total) by mouth daily.   pantoprazole 40 MG tablet Commonly known as: PROTONIX TAKE 1 TABLET DAILY 30 TO 60 MINUTES BEFORE FIRST MEAL OF THE DAY   QUEtiapine 100 MG tablet Commonly known as: SEROQUEL Take 1 tablet (100 mg total) by mouth at bedtime.   Repatha SureClick 140 MG/ML Soaj Generic drug: Evolocumab Inject 140 mg into  the skin every 14 (fourteen) days. Inject 1 pen subcutaneously every 14 days.  Needs appt for further fills   Stiolto Respimat 2.5-2.5 MCG/ACT Aers Generic drug: Tiotropium Bromide-Olodaterol Inhale 2 Inhalations into the lungs daily.   Vitamin D3 125 MCG (5000 UT) Caps Take 1 capsule (5,000 Units total) by mouth daily.   vitamin E 45 MG (100 UNITS) capsule Take 100 Units by mouth daily.        Allergies:  Allergies  Allergen Reactions   Lamisil [Terbinafine Hcl] Other (See Comments)    Loss of taste     Family History: Family History  Problem Relation Age of Onset   Colon cancer Mother    Cancer Mother        colon   Heart disease Father        Heart Disease before age 67   Heart attack Father 5   Arthritis Sister    Esophageal cancer Neg Hx    Rectal cancer Neg Hx    Stomach cancer Neg Hx    Colon polyps Neg Hx     Social History:  reports that he quit smoking about 26  years ago. His smoking use included cigarettes. He started smoking about 66 years ago. He has a 60 pack-year smoking history. He has never used smokeless tobacco. He reports that he does not drink alcohol and does not use drugs.   Physical Exam: BP 103/60   Pulse (!) 40   Constitutional:  Alert and oriented, No acute distress. HEENT: [place] AT, moist mucus membranes.  Trachea midline, no masses. Cardiovascular: No clubbing, cyanosis, or edema. Respiratory: Normal respiratory effort, no increased work of breathing. GI: Abdomen is soft, nontender, nondistended, no abdominal masses GU: No CVA tenderness Skin: No rashes, bruises or suspicious lesions. Neurologic: Grossly intact, no focal deficits, moving all 4 extremities. Psychiatric: Normal mood and affect.  Laboratory Data: Lab Results  Component Value Date   WBC 7.4 03/20/2023   HGB 14.3 03/20/2023   HCT 43.3 03/20/2023   MCV 93 03/20/2023   PLT 196 03/20/2023    Lab Results  Component Value Date   CREATININE 1.21 03/20/2023    No results found for: "PSA"  No results found for: "TESTOSTERONE"  Lab Results  Component Value Date   HGBA1C 6.0 (H) 03/29/2017    Urinalysis    Component Value Date/Time   COLORURINE COLORLESS (A) 09/19/2020 1431   APPEARANCEUR CLEAR 09/19/2020 1431   LABSPEC 1.002 (L) 09/19/2020 1431   PHURINE 7.0 09/19/2020 1431   GLUCOSEU NEGATIVE 09/19/2020 1431   HGBUR NEGATIVE 09/19/2020 1431   BILIRUBINUR NEGATIVE 09/19/2020 1431   KETONESUR NEGATIVE 09/19/2020 1431   PROTEINUR NEGATIVE 09/19/2020 1431   NITRITE NEGATIVE 09/19/2020 1431   LEUKOCYTESUR NEGATIVE 09/19/2020 1431    No results found for: "LABMICR", "WBCUA", "RBCUA", "LABEPIT", "MUCUS", "BACTERIA"  Pertinent Imaging: N/a  Assessment & Plan:    1. History of prostate cancer (Primary) PSA remains undetectable.  - Urinalysis, Routine w reflex microscopic  2. Mixed incontinence - myrbetriq controlling frequency and urgency.  Consider PT, PTNS, botox, AUS. Refer to AUS PT and to see Dr. Lafonda Mosses for incontinence evaluation. Discussed sling, AUS. Discussed he may need UDS and cystoscopy with Dr, Lafonda Mosses.    No follow-ups on file.  Jerilee Field, MD  [facility]  [address] [place], [place] [address] [phone]

## 2023-07-27 ENCOUNTER — Other Ambulatory Visit: Payer: Self-pay | Admitting: Family Medicine

## 2023-07-27 ENCOUNTER — Other Ambulatory Visit: Payer: Self-pay | Admitting: Internal Medicine

## 2023-07-27 DIAGNOSIS — I10 Essential (primary) hypertension: Secondary | ICD-10-CM

## 2023-08-07 ENCOUNTER — Telehealth: Payer: Self-pay | Admitting: Internal Medicine

## 2023-08-07 NOTE — Telephone Encounter (Signed)
 Refill request from [facility]--Pantoprazole  Sodium Dr. Tabs 40mg     90 days supply or_____     Directions:                     Refills: 3 or______                      Pharmacy call back [phone]     Fax [phone]

## 2023-08-07 NOTE — Telephone Encounter (Signed)
 Per chart patient has not been seen in office since 2023. Refill request denied.  [facility] advised via phone.

## 2023-09-04 DIAGNOSIS — Z8546 Personal history of malignant neoplasm of prostate: Secondary | ICD-10-CM | POA: Diagnosis not present

## 2023-09-04 DIAGNOSIS — N3946 Mixed incontinence: Secondary | ICD-10-CM | POA: Diagnosis not present

## 2023-09-18 ENCOUNTER — Ambulatory Visit: Payer: TRICARE For Life (TFL) | Admitting: Family Medicine

## 2023-09-20 ENCOUNTER — Ambulatory Visit: Payer: TRICARE For Life (TFL) | Admitting: Family Medicine

## 2023-09-20 ENCOUNTER — Encounter: Payer: Self-pay | Admitting: Family Medicine

## 2023-09-20 VITALS — BP 128/65 | HR 89 | Ht 70.0 in | Wt 182.0 lb

## 2023-09-20 DIAGNOSIS — I10 Essential (primary) hypertension: Secondary | ICD-10-CM

## 2023-09-20 DIAGNOSIS — F5104 Psychophysiologic insomnia: Secondary | ICD-10-CM | POA: Diagnosis not present

## 2023-09-20 DIAGNOSIS — J449 Chronic obstructive pulmonary disease, unspecified: Secondary | ICD-10-CM

## 2023-09-20 DIAGNOSIS — E785 Hyperlipidemia, unspecified: Secondary | ICD-10-CM

## 2023-09-20 DIAGNOSIS — Z8546 Personal history of malignant neoplasm of prostate: Secondary | ICD-10-CM

## 2023-09-20 MED ORDER — AMLODIPINE-OLMESARTAN 10-40 MG PO TABS: 1.0000 | ORAL_TABLET | Freq: Every day | ORAL | 3 refills | Status: DC

## 2023-09-20 MED ORDER — QUETIAPINE FUMARATE 100 MG PO TABS: 100.0000 mg | ORAL_TABLET | Freq: Every day | ORAL | 3 refills | Status: AC

## 2023-09-20 MED ORDER — PANTOPRAZOLE SODIUM 40 MG PO TBEC: 40.0000 mg | DELAYED_RELEASE_TABLET | Freq: Every day | ORAL | 3 refills | Status: AC

## 2023-09-20 NOTE — Progress Notes (Signed)
 Established Patient Office Visit  Subjective   Patient ID: [name], male    DOB: [birth year]  Age: 88 y.o. MRN: [id number]  Chief Complaint  Patient presents with   Medical Management of Chronic Issues   Hyperlipidemia   Hypertension   HPI  (1) Hypertension  The patient is present for a routine follow-up visit for management of hypertension. They report overall stable blood pressure control since the last visit. Home blood pressure readings average around 120s/70s. The patient denies any recent episodes of headache, dizziness, vision changes, chest pain, shortness of breath, or palpitations. They report good adherence to their medications, which include Amlodipine -Olmesartan  (10-40 Mg). They are aware of the importance of low-sodium dietary habits and report adhering to dietary recommendations. No recent hospitalizations ER visits, or medication change. No new questions or concerns.   (2) Hyperlipidemia  The patient is present for a routine follow-up visit for management of hyperlipidemia. They report adherence to their current lipid-lowering regime, which includes Evolocumab . They deny any side effects such as muscle aches, weakness, abdominal pain, discomfort, or changes in appetite. Last lipid panel was performed on 03/20/23 and showed total cholesterol 134, LDL 52, HDL 37, triglycerides 290. The patient reports partial adherence to dietary recommendations including reduction in saturated fats and cholesterol intake. No new cardiovascular symptoms such as chest pain, exertional dyspnea, or claudication. No new questions or concerns.    Review of Systems  Constitutional:  Negative for chills, fever, malaise/fatigue and weight loss.  HENT:  Negative for congestion, hearing loss and sore throat.   Eyes:  Negative for blurred vision.  Respiratory:  Negative for cough and shortness of breath.   Cardiovascular:  Negative for chest pain, palpitations and leg swelling.   Gastrointestinal:  Negative for abdominal pain, constipation, diarrhea and nausea.  Genitourinary:  Negative for dysuria and urgency.  Musculoskeletal:  Negative for joint pain and myalgias.  Skin:  Negative for itching and rash.  Neurological:  Negative for dizziness, tingling and headaches.  Psychiatric/Behavioral:  Negative for depression.       Objective:     BP 128/65   Pulse 89   Ht 5' 10 (1.778 m)   Wt 182 lb (82.6 kg)   SpO2 97%   BMI 26.11 kg/m    Physical Exam Constitutional:      General: He is not in acute distress.    Appearance: Normal appearance. He is normal weight.  HENT:     Head: Normocephalic and atraumatic.  Cardiovascular:     Rate and Rhythm: Normal rate and regular rhythm.     Pulses: Normal pulses.     Heart sounds: No murmur heard.    No gallop.  Pulmonary:     Effort: Pulmonary effort is normal. No respiratory distress.     Breath sounds: Normal breath sounds.  Abdominal:     General: Abdomen is flat. Bowel sounds are normal.     Palpations: Abdomen is soft.  Musculoskeletal:     Cervical back: Normal range of motion and neck supple.  Skin:    General: Skin is warm and dry.  Neurological:     Mental Status: He is alert and oriented to person, place, and time.  Psychiatric:        Mood and Affect: Mood normal.        Behavior: Behavior normal.      No results found for any visits on 09/20/23.        Assessment & Plan:   (  1) HTN - Assessment: Well controlled with medications.  - Plan: Continue taking Amlodipine -Olmesartan  as prescribed.   (2) Hyperlipidemia  - Assessment: Well controlled with diet and medications.  - Plan: Continue taking Evolocumab  as prescribed. Will check levels again today.   RTC in 6 months for routine follow-up or sooner if new symptoms are noticed.     Return in about 6 months (around 03/22/2024), or if symptoms worsen or fail to improve, for Physical exam.    Dotty Blanch, Medical Student   [employer]  at [facility] 09/21/23 7:59 AM     Patient seen and examined with medical student, agree with assessment and plan above Fonda Levins, MD [facility] 10/04/2023, 12:33 PM

## 2023-09-21 ENCOUNTER — Other Ambulatory Visit

## 2023-09-21 DIAGNOSIS — Z8546 Personal history of malignant neoplasm of prostate: Secondary | ICD-10-CM | POA: Diagnosis not present

## 2023-09-21 DIAGNOSIS — J449 Chronic obstructive pulmonary disease, unspecified: Secondary | ICD-10-CM | POA: Diagnosis not present

## 2023-09-21 DIAGNOSIS — I10 Essential (primary) hypertension: Secondary | ICD-10-CM | POA: Diagnosis not present

## 2023-09-21 DIAGNOSIS — E785 Hyperlipidemia, unspecified: Secondary | ICD-10-CM

## 2023-09-22 LAB — CMP14+EGFR
ALT: 11 IU/L (ref 0–44)
AST: 16 IU/L (ref 0–40)
Albumin: 4.4 g/dL (ref 3.7–4.7)
Alkaline Phosphatase: 56 IU/L (ref 44–121)
BUN/Creatinine Ratio: 11 (ref 10–24)
BUN: 15 mg/dL (ref 8–27)
Bilirubin Total: 0.4 mg/dL (ref 0.0–1.2)
CO2: 17 mmol/L — ABNORMAL LOW (ref 20–29)
Calcium: 9.4 mg/dL (ref 8.6–10.2)
Chloride: 106 mmol/L (ref 96–106)
Creatinine, Ser: 1.36 mg/dL — ABNORMAL HIGH (ref 0.76–1.27)
Globulin, Total: 1.9 g/dL (ref 1.5–4.5)
Glucose: 114 mg/dL — ABNORMAL HIGH (ref 70–99)
Potassium: 4.3 mmol/L (ref 3.5–5.2)
Sodium: 139 mmol/L (ref 134–144)
Total Protein: 6.3 g/dL (ref 6.0–8.5)
eGFR: 51 mL/min/1.73 — ABNORMAL LOW (ref >59)

## 2023-09-22 LAB — CBC WITH DIFFERENTIAL/PLATELET
Basophils Absolute: 0.1 x10E3/uL (ref 0.0–0.2)
Basos: 1 %
EOS (ABSOLUTE): 0.4 x10E3/uL (ref 0.0–0.4)
Eos: 7 %
Hematocrit: 39.1 % (ref 37.5–51.0)
Hemoglobin: 12.5 g/dL — ABNORMAL LOW (ref 13.0–17.7)
Immature Grans (Abs): 0 x10E3/uL (ref 0.0–0.1)
Immature Granulocytes: 0 %
Lymphocytes Absolute: 1.7 x10E3/uL (ref 0.7–3.1)
Lymphs: 28 %
MCH: 30.8 pg (ref 26.6–33.0)
MCHC: 32 g/dL (ref 31.5–35.7)
MCV: 96 fL (ref 79–97)
Monocytes Absolute: 0.4 x10E3/uL (ref 0.1–0.9)
Monocytes: 7 %
Neutrophils Absolute: 3.6 x10E3/uL (ref 1.4–7.0)
Neutrophils: 56 %
Platelets: 201 x10E3/uL (ref 150–450)
RBC: 4.06 x10E6/uL — ABNORMAL LOW (ref 4.14–5.80)
RDW: 13.4 % (ref 11.6–15.4)
WBC: 6.3 x10E3/uL (ref 3.4–10.8)

## 2023-09-22 LAB — LIPID PANEL
Chol/HDL Ratio: 3.6 ratio (ref 0.0–5.0)
Cholesterol, Total: 131 mg/dL (ref 100–199)
HDL: 36 mg/dL — ABNORMAL LOW (ref >39)
LDL Chol Calc (NIH): 68 mg/dL (ref 0–99)
Triglycerides: 155 mg/dL — ABNORMAL HIGH (ref 0–149)
VLDL Cholesterol Cal: 27 mg/dL (ref 5–40)

## 2023-09-22 LAB — PSA, TOTAL AND FREE
PSA, Free: <0.02 ng/mL
Prostate Specific Ag, Serum: <0.1 ng/mL (ref 0.0–4.0)

## 2023-09-25 ENCOUNTER — Ambulatory Visit: Payer: Self-pay | Admitting: Family Medicine

## 2023-09-29 DIAGNOSIS — N3946 Mixed incontinence: Secondary | ICD-10-CM | POA: Diagnosis not present

## 2023-10-09 DIAGNOSIS — N3946 Mixed incontinence: Secondary | ICD-10-CM | POA: Diagnosis not present

## 2023-10-09 DIAGNOSIS — Z8546 Personal history of malignant neoplasm of prostate: Secondary | ICD-10-CM | POA: Diagnosis not present

## 2023-10-11 ENCOUNTER — Telehealth: Payer: Self-pay | Admitting: Family Medicine

## 2023-10-11 NOTE — Telephone Encounter (Signed)
 LMTCB to schedule surigical clearance [name] has form

## 2023-10-23 DIAGNOSIS — L280 Lichen simplex chronicus: Secondary | ICD-10-CM | POA: Diagnosis not present

## 2023-10-23 DIAGNOSIS — L820 Inflamed seborrheic keratosis: Secondary | ICD-10-CM | POA: Diagnosis not present

## 2023-10-23 DIAGNOSIS — L570 Actinic keratosis: Secondary | ICD-10-CM | POA: Diagnosis not present

## 2023-10-23 DIAGNOSIS — X32XXXD Exposure to sunlight, subsequent encounter: Secondary | ICD-10-CM | POA: Diagnosis not present

## 2023-10-25 ENCOUNTER — Other Ambulatory Visit: Payer: Self-pay | Admitting: Medical Genetics

## 2023-11-02 ENCOUNTER — Encounter: Payer: Self-pay | Admitting: Family Medicine

## 2023-11-02 ENCOUNTER — Telehealth: Admitting: Family Medicine

## 2023-11-02 ENCOUNTER — Ambulatory Visit: Payer: Self-pay

## 2023-11-02 DIAGNOSIS — S80869A Insect bite (nonvenomous), unspecified lower leg, initial encounter: Secondary | ICD-10-CM | POA: Diagnosis not present

## 2023-11-02 DIAGNOSIS — W57XXXA Bitten or stung by nonvenomous insect and other nonvenomous arthropods, initial encounter: Secondary | ICD-10-CM | POA: Diagnosis not present

## 2023-11-02 MED ORDER — PREDNISONE 20 MG PO TABS: ORAL_TABLET | ORAL | 0 refills | Status: DC

## 2023-11-02 NOTE — Progress Notes (Signed)
 Virtual Visit via MyChart video note  I connected with [name] on 11/02/23 at 1310 by video and verified that I am speaking with the correct person using two identifiers. [name] is currently located at home and patient are currently with her during visit. The provider, Fonda LABOR Jezebelle Ledwell, MD is located in their office at time of visit.  Call ended at 1319  I discussed the limitations, risks, security and privacy concerns of performing an evaluation and management service by video and the availability of in person appointments. I also discussed with the patient that there may be a patient responsible charge related to this service. The patient expressed understanding and agreed to proceed.   History and Present Illness: Discussed the use of AI scribe software for clinical note transcription with the patient, who gave verbal consent to proceed.  History of Present Illness   [name] is an 88 year old male who presents with severe itching due to flea bites.  He has been experiencing severe itching due to a recent flea infestation in his home. He was bitten over a hundred times, primarily on his lower extremities, including his feet, legs, and buttocks, with some bites on his arms as well. The itching is described as 'driving me crazy'.  He has been using over-the-counter topical treatments such as cortisone spray, but finds them ineffective, describing them as making his skin 'slimy and sticky'. He is seeking relief from the itching, which is significantly impacting his comfort and daily life.  He notes that he is the only one in his household affected by the flea bites, despite there being four other people living with him. He is relieved that his great-grandchildren were not bitten.  He has treated his pets for fleas and is keeping them out of the house until he is sure the treatment has taken effect and the house is free of fleas.        Outpatient Encounter  Medications as of 11/02/2023  Medication Sig   predniSONE (DELTASONE) 20 MG tablet 2 po at same time daily for 5 days   albuterol (PROAIR HFA) 108 (90 Base) MCG/ACT inhaler 2 puffs every 4 hours as needed only  if your can't catch your breath   amLODipine-olmesartan (AZOR) 10-40 MG tablet Take 1 tablet by mouth daily.   aspirin 81 MG tablet Take 81 mg by mouth 2 (two) times daily.    cetirizine (ZYRTEC) 10 MG tablet Take 1 tablet (10 mg total) by mouth daily.   Cholecalciferol (VITAMIN D3) 125 MCG (5000 UT) CAPS Take 1 capsule (5,000 Units total) by mouth daily.   donepezil (ARICEPT) 10 MG tablet Take 1 tablet (10 mg total) by mouth at bedtime.   Evolocumab (REPATHA SURECLICK) 140 MG/ML SOAJ Inject 140 mg into the skin every 14 (fourteen) days. Inject 1 pen subcutaneously every 14 days.  Needs appt for further fills   fenofibrate (TRICOR) 48 MG tablet Take 1 tablet (48 mg total) by mouth daily.   Iron Combinations (IRON COMPLEX PO) Take 60 mg by mouth daily.   mirabegron ER (MYRBETRIQ) 50 MG TB24 tablet Take 1 tablet (50 mg total) by mouth daily.   pantoprazole (PROTONIX) 40 MG tablet Take 1 tablet (40 mg total) by mouth daily.   QUEtiapine (SEROQUEL) 100 MG tablet Take 1 tablet (100 mg total) by mouth at bedtime.   Tiotropium Bromide-Olodaterol (STIOLTO RESPIMAT) 2.5-2.5 MCG/ACT AERS Inhale 2 Inhalations into the lungs daily.   No facility-administered encounter medications on  file as of 11/02/2023.    Review of Systems  Constitutional:  Negative for chills and fever.  Eyes:  Negative for discharge.  Respiratory:  Negative for shortness of breath and wheezing.   Cardiovascular:  Negative for chest pain and leg swelling.  Musculoskeletal:  Negative for back pain and gait problem.  Skin:  Positive for rash. Negative for color change.  All other systems reviewed and are negative.   Observations/Objective: Patient is comfortable and in no acute distress  Assessment and Plan: Problem List  Items Addressed This Visit   None Visit Diagnoses       Insect bite of lower leg, unspecified laterality, initial encounter    -  Primary   Relevant Medications   predniSONE (DELTASONE) 20 MG tablet     Flea bite of multiple sites              Flea bites with pruritus Acute flea bites on lower extremities and arms causing pruritus. House treated for infestation. No infection, but risk if scratching persists. - Prescribed prednisone 40 mg daily for 5 days, take with food in the morning. - Recommend OTC Benadryl morning and night for pruritus. - Advise use of moisturizer like CeraVe, Eucerin, Cetaphil, or Dove. - Instruct to monitor for infection signs and contact if symptoms develop. - Suggest topical cortisone for severe pruritus. - Recommend ice application for intense itching. - Advise cooler or lukewarm showers. - Ensure pets treated and isolated until house is flea-free.       Follow up plan: Return if symptoms worsen or fail to improve.     I discussed the assessment and treatment plan with the patient. The patient was provided an opportunity to ask questions and all were answered. The patient agreed with the plan and demonstrated an understanding of the instructions.   The patient was advised to call back or seek an in-person evaluation if the symptoms worsen or if the condition fails to improve as anticipated.  The above assessment and management plan was discussed with the patient. The patient verbalized understanding of and has agreed to the management plan. Patient is aware to call the clinic if symptoms persist or worsen. Patient is aware when to return to the clinic for a follow-up visit. Patient educated on when it is appropriate to go to the emergency department.    I provided 9 minutes of non-face-to-face time during this encounter.    Fonda DELENA Levins, MD

## 2023-11-02 NOTE — Telephone Encounter (Signed)
 Apt scheduled.

## 2023-11-02 NOTE — Telephone Encounter (Signed)
 FYI Only or Action Required?: Action required by provider: request for appointment.  Patient was last seen in primary care on 09/20/2023 by Dettinger, Fonda LABOR, MD.  Called Nurse Triage reporting Insect Bite.  Symptoms began several days ago.  Interventions attempted: OTC medications:  SABRA  Symptoms are: gradually worsening. Reports severe itching from bites.  Triage Disposition: See PCP When Office is Open (Within 3 Days)  Patient/caregiver understands and will follow disposition?:    Copied from CRM #[id number]. Topic: Clinical - Red Word Triage >> Nov 02, 2023  9:01 AM Avram MATSU wrote: Red Word that prompted transfer to Nurse Triage: severe itching due to flea bites Reason for Disposition  [1] SEVERE local itching (e.g., interferes with work, school, sleep) AND [2] not improved after 24 hours of hydrocortisone cream  Answer Assessment - Initial Assessment Questions 1. TYPE of INSECT: What type of insect was it?      fleas 2. ONSET: When did you get bitten?      5 days ago 3. LOCATION: Where is the insect bite located?      All over 4. REDNESS: Is the area red or pink? If Yes, ask: What size is the area of redness? (inches or cm). When did the redness start?     yes 5. PAIN: Is there any pain? If Yes, ask: How bad is the pain? (Scale 0-10; or none, mild, moderate, severe)     no 6. ITCHING: Does it itch? If Yes, ask: How bad is the itch?      severe 7. SWELLING: How big is the swelling? (e.g., inches, cm, or compare to coins)     no 8. OTHER SYMPTOMS: Do you have any other symptoms?  (e.g., difficulty breathing, fever, hives)     no 9. PREGNANCY: Is there any chance you are pregnant? When was your last menstrual period?     N/a  Protocols used: Insect Bite-A-AH

## 2023-11-13 ENCOUNTER — Ambulatory Visit (INDEPENDENT_AMBULATORY_CARE_PROVIDER_SITE_OTHER): Admitting: Family Medicine

## 2023-11-13 ENCOUNTER — Ambulatory Visit: Payer: Self-pay

## 2023-11-13 ENCOUNTER — Encounter: Payer: Self-pay | Admitting: Family Medicine

## 2023-11-13 VITALS — BP 103/58 | HR 97 | Temp 97.6°F | Ht 70.0 in | Wt 181.0 lb

## 2023-11-13 DIAGNOSIS — L282 Other prurigo: Secondary | ICD-10-CM | POA: Diagnosis not present

## 2023-11-13 MED ORDER — HYDROXYZINE HCL 10 MG PO TABS: 10.0000 mg | ORAL_TABLET | Freq: Three times a day (TID) | ORAL | 0 refills | Status: DC | PRN

## 2023-11-13 MED ORDER — TRIAMCINOLONE ACETONIDE 0.5 % EX OINT: 1.0000 | TOPICAL_OINTMENT | Freq: Two times a day (BID) | CUTANEOUS | 0 refills | Status: AC

## 2023-11-13 MED ORDER — PERMETHRIN 5 % EX CREA: TOPICAL_CREAM | CUTANEOUS | 0 refills | Status: DC

## 2023-11-13 NOTE — Telephone Encounter (Signed)
 FYI Only or Action Required?: Action required by provider: update on patient condition.  Patient was last seen in primary care on 11/02/2023 by Dettinger, Fonda LABOR, MD.  Called Nurse Triage reporting Insect Bite.  Symptoms began several weeks ago.  Interventions attempted: OTC medications: benadryl and Prescription medications: predniSONE  (DELTASONE ) 20 MG tablet.  Symptoms are: unchanged.  Triage Disposition: See PCP When Office is Open (Within 3 Days)  Patient/caregiver understands and will follow disposition?: Yes     Copied from CRM #[id number]. Topic: Clinical - Red Word Triage >> Nov 13, 2023  9:54 AM Willma SAUNDERS wrote: [facility] that prompted transfer to Nurse Triage: Patients thought he was having a flea infestation but his house has been treated but is covered from head to toes in some kind of bites. Bites are constantly itching and swollen and it is driving him crazy. Reason for Disposition  [1] SEVERE local itching (e.g., interferes with work, school, sleep) AND [2] not improved after 24 hours of hydrocortisone cream  Answer Assessment - Initial Assessment Questions 1. TYPE of INSECT: What type of insect was it?      unknown 2. ONSET: When did you get bitten?      2 weeks ago - initially thought r/t fleas, but pt reports has gotten house treatments x 2 without relief 3. LOCATION: Where is the insect bite located?      Totally covered 4. REDNESS: Is the area red or pink? If Yes, ask: What size is the area of redness? (inches or cm). When did the redness start?     yes 5. PAIN: Is there any pain? If Yes, ask: How bad is the pain? (Scale 0-10; or none, mild, moderate, severe)     yes 6. ITCHING: Does it itch? If Yes, ask: How bad is the itch?      Yes, relentless 7. SWELLING: How big is the swelling? (e.g., inches, cm, or compare to coins)     yes 8. OTHER SYMPTOMS: Do you have any other symptoms?  (e.g., difficulty breathing, fever, hives)      denies 9. PREGNANCY: Is there any chance you are pregnant? When was your last menstrual period?     N/a    Triager attempted to schedule with alternate PCP, pt declined, wanting to have AV today. Triager scheduled with [facility] instead.  Protocols used: Insect Bite-A-AH

## 2023-11-13 NOTE — Telephone Encounter (Signed)
 Scheduled appt today.

## 2023-11-13 NOTE — Progress Notes (Signed)
 Acute Office Visit  Subjective:     Patient ID: [name], male    DOB: 12-12-1935, 88 y.o.   MRN: [id number]  Chief Complaint  Patient presents with   Rash    HPI  History of Present Illness   [name] is an 88 year old male who presents with a worsening rash for two weeks.  Pruritic rash - Worsening over two weeks - Widespread distribution involving legs, back, stomach, and arms; face is spared - Onset began at the ankle and subsequently spread to other areas - Intensely pruritic with clear fluid discharge upon scratching - Initial improvement with prednisone  and topical steroid cream, but rash worsened after ending treatment - No similar rash in household members - No recent changes in soaps, detergents, medications or foods - Flea treatment for pets and home was ineffective; no fleas found - Has been taking 50 mg of benadryl with some improvement in itching  Associated symptoms and systemic findings - No congestion, fever, nausea, or vomiting - No new or unusual swelling; swelling present is typical for him  Environmental and activity exposure - Has been cutting grass - No other recent outdoor activities        ROS As per HPI.     Objective:    BP (!) 103/58   Pulse 97   Temp 97.6 F (36.4 C) (Temporal)   Ht 5' 10 (1.778 m)   Wt 181 lb (82.1 kg)   SpO2 98%   BMI 25.97 kg/m    Physical Exam Vitals and nursing note reviewed.  Constitutional:      General: He is not in acute distress.    Appearance: He is not ill-appearing, toxic-appearing or diaphoretic.  Pulmonary:     Effort: Pulmonary effort is normal. No respiratory distress.  Skin:    General: Skin is warm and dry.     Findings: Rash present. Rash is papular (small erythematous papules to trunk, extremities. No rash to face or hands. See pictures below).  Neurological:     General: No focal deficit present.     Mental Status: He is alert and oriented to person, place, and  time.  Psychiatric:        Mood and Affect: Mood normal.        Behavior: Behavior normal.            No results found for any visits on 11/13/23.      Assessment & Plan:   Kupono was seen today for rash.  Diagnoses and all orders for this visit:  Pruritic rash Consulted with supervising physician. Reviewed OV note from 11/02/23. ? Scabies. Permethrin  cream as below. Try hydroxyzine  prn for itching- do not take benadryl with this. Ok to continue kenalog  BID for itching. Return to office for new or worsening symptoms, or if symptoms persist.  -     permethrin  (ELIMITE ) 5 % cream; Apply and massage in cream from neck to toes. Leave on for 8 to 14 hours before washing off with water. May reapply in 14 days if live mites appear. -     hydrOXYzine  (ATARAX ) 10 MG tablet; Take 1 tablet (10 mg total) by mouth 3 (three) times daily as needed for itching. Do not take with benadryl -     triamcinolone  ointment (KENALOG ) 0.5 %; Apply 1 Application topically 2 (two) times daily for 7 days.    Return to office for new or worsening symptoms, or if symptoms persist.   The patient  indicates understanding of these issues and agrees with the plan.  Annabella CHRISTELLA Search, FNP

## 2023-11-14 ENCOUNTER — Other Ambulatory Visit

## 2023-11-15 ENCOUNTER — Ambulatory Visit: Payer: Self-pay

## 2023-11-15 NOTE — Telephone Encounter (Signed)
 Patient reports itching is not controlled with hydroxyzine  10 mg three times per day. Would like increased dose or frequency. States it is so bad that he is considering going to the ED if unable to adjust medication. Please advise. If changing medication, please send to: [facility] [address] - [place], [place] - [address] [place] [[id number]]    Copied from CRM [id number]. Topic: Clinical - Medication Question >> Nov 15, 2023  3:35 PM Avram MATSU wrote: Reason for CRM: Patient would like to know if he can take hydrOXYzine  (ATARAX ) 10 MG tablet [[id number]] more than 3 times a day for itching, please advise [phone]

## 2023-11-15 NOTE — Telephone Encounter (Signed)
 Patient aware and verbalized understanding.

## 2023-11-15 NOTE — Telephone Encounter (Signed)
 He can try 20 mg TID if he isn't drowsy with hydroxyzine .

## 2023-11-16 ENCOUNTER — Ambulatory Visit
Admission: EM | Admit: 2023-11-16 | Discharge: 2023-11-16 | Disposition: A | Discharge status: Home / Self Care | Attending: Family Medicine | Admitting: Family Medicine

## 2023-11-16 ENCOUNTER — Encounter: Payer: Self-pay | Admitting: Emergency Medicine

## 2023-11-16 ENCOUNTER — Other Ambulatory Visit: Payer: Self-pay

## 2023-11-16 DIAGNOSIS — L03119 Cellulitis of unspecified part of limb: Secondary | ICD-10-CM

## 2023-11-16 DIAGNOSIS — R21 Rash and other nonspecific skin eruption: Secondary | ICD-10-CM

## 2023-11-16 MED ORDER — CEPHALEXIN 500 MG PO CAPS: 500.0000 mg | ORAL_CAPSULE | Freq: Two times a day (BID) | ORAL | 0 refills | Status: DC

## 2023-11-16 MED ORDER — METHYLPREDNISOLONE ACETATE 40 MG/ML IJ SUSP
40.0000 mg | Freq: Once | INTRAMUSCULAR | Status: AC
  Administered 2023-11-16: 40 mg via INTRAMUSCULAR

## 2023-11-16 MED ORDER — CHLORHEXIDINE GLUCONATE 4 % EX SOLN: Freq: Every day | CUTANEOUS | 0 refills | Status: DC | PRN

## 2023-11-16 NOTE — Discharge Instructions (Signed)
 Clean the open areas with the Hibiclens  once to twice daily.  We have given you a steroid shot today to help with the itching and inflammation and an antibiotic because the areas around your lower legs appear to be getting a secondary bacterial infection.  Elevate your legs at rest to help with swelling.  I do recommend retreating with the permethrin  1 week after first treatment.

## 2023-11-16 NOTE — ED Provider Notes (Signed)
[facility]    CSN: [id number] Arrival date & time: 11/16/23  1106      History   Chief Complaint Chief Complaint  Patient presents with   Pruritis    HPI [name] is a 88 y.o. male.   Patient presenting today with 4-day history of significant itching across entire body, rash.  Was seen Monday by PCP and was treated for scabies with triamcinolone  cream, hydroxyzine , permethrin .  Has been following this regimen but states symptoms are worse particularly to his ankles and lower legs.  Denies fever, chills, new medications or exposures, throat itching or swelling, chest tightness.    Past Medical History:  Diagnosis Date   Adenomatous colon polyp 11/1999   Allergy    Blood transfusion without reported diagnosis    Cancer [facility])    Prostate   Carotid artery occlusion    Cataract    COPD (chronic obstructive pulmonary disease) (HCC)    GERD (gastroesophageal reflux disease)    Hyperlipidemia    Hypertension    Internal hemorrhoids    PVC (premature ventricular contraction)     Patient Active Problem List   Diagnosis Date Noted   Psychophysiological insomnia 06/11/2021   PVC's (premature ventricular contractions) 05/03/2017   Overactive bladder 06/08/2015   Occlusion and stenosis of carotid artery without mention of cerebral infarction 01/31/2012   History of prostate cancer 08/20/2008   Hyperlipidemia LDL goal <100 08/20/2008   ULNAR NEUROPATHY 08/20/2008   HTN (hypertension), benign 08/20/2008   Coronary atherosclerosis 08/20/2008   CEREBROVASCULAR DISEASE 08/20/2008   COPD GOLD II 09/24/2007    Past Surgical History:  Procedure Laterality Date   APPENDECTOMY     carotid surgery     CATARACT EXTRACTION     both eyes   COLONOSCOPY     POLYPECTOMY     TRANSURETHRAL RESECTION OF PROSTATE     UPPER GASTROINTESTINAL ENDOSCOPY     UPPER GI ENDOSCOPY  Nov. 3, 2014   Upper endoscopy with diatation       Home Medications    Prior to  Admission medications   Medication Sig Start Date End Date Taking? Authorizing Provider  cephALEXin  (KEFLEX ) 500 MG capsule Take 1 capsule (500 mg total) by mouth 2 (two) times daily. 11/16/23  Yes Stuart Vernell Norris, PA-C  chlorhexidine  (HIBICLENS ) 4 % external liquid Apply topically daily as needed. 11/16/23  Yes Stuart Vernell Norris, PA-C  albuterol  (PROAIR  HFA) 108 (90 Base) MCG/ACT inhaler 2 puffs every 4 hours as needed only  if your can't catch your breath 11/24/21   Darlean Ozell NOVAK, MD  amLODipine -olmesartan  (AZOR ) 10-40 MG tablet Take 1 tablet by mouth daily. 09/20/23   Dettinger, Fonda LABOR, MD  aspirin 81 MG tablet Take 81 mg by mouth 2 (two) times daily.     [provider]  cetirizine  (ZYRTEC ) 10 MG tablet Take 1 tablet (10 mg total) by mouth daily. 03/20/23   Dettinger, Fonda LABOR, MD  Cholecalciferol (VITAMIN D3) 125 MCG (5000 UT) CAPS Take 1 capsule (5,000 Units total) by mouth daily. 03/20/23   Dettinger, Fonda LABOR, MD  donepezil  (ARICEPT ) 10 MG tablet Take 1 tablet (10 mg total) by mouth at bedtime. 03/20/23   Dettinger, Fonda LABOR, MD  Evolocumab  (REPATHA  SURECLICK) 140 MG/ML SOAJ Inject 140 mg into the skin every 14 (fourteen) days. Inject 1 pen subcutaneously every 14 days.  Needs appt for further fills 03/20/23   Dettinger, Fonda LABOR, MD  fenofibrate  (TRICOR ) 48 MG tablet Take  1 tablet (48 mg total) by mouth daily. 03/24/23   Dettinger, Fonda LABOR, MD  hydrOXYzine  (ATARAX ) 10 MG tablet Take 1 tablet (10 mg total) by mouth 3 (three) times daily as needed for itching. Do not take with benadryl 11/13/23   Joesph Annabella HERO, FNP  Iron Combinations (IRON COMPLEX PO) Take 60 mg by mouth daily.    [provider]  mirabegron  ER (MYRBETRIQ ) 50 MG TB24 tablet Take 1 tablet (50 mg total) by mouth daily. 03/20/23   Dettinger, Fonda LABOR, MD  pantoprazole  (PROTONIX ) 40 MG tablet Take 1 tablet (40 mg total) by mouth daily. 09/20/23   Dettinger, Fonda LABOR, MD  permethrin  (ELIMITE ) 5 % cream  Apply and massage in cream from neck to toes. Leave on for 8 to 14 hours before washing off with water. May reapply in 14 days if live mites appear. 11/13/23   Joesph Annabella HERO, FNP  QUEtiapine  (SEROQUEL ) 100 MG tablet Take 1 tablet (100 mg total) by mouth at bedtime. 09/20/23   Dettinger, Joshua A, MD  Tiotropium Bromide-Olodaterol (STIOLTO RESPIMAT ) 2.5-2.5 MCG/ACT AERS Inhale 2 Inhalations into the lungs daily. 03/20/23   Dettinger, Fonda LABOR, MD  triamcinolone  ointment (KENALOG ) 0.5 % Apply 1 Application topically 2 (two) times daily for 7 days. 11/13/23 11/20/23  Joesph Annabella HERO, FNP    Family History Family History  Problem Relation Age of Onset   Colon cancer Mother    Cancer Mother        colon   Heart disease Father        Heart Disease before age 79   Heart attack Father 47   Arthritis Sister    Esophageal cancer Neg Hx    Rectal cancer Neg Hx    Stomach cancer Neg Hx    Colon polyps Neg Hx     Social History Social History   Tobacco Use   Smoking status: Former    Current packs/day: 0.00    Average packs/day: 1.5 packs/day for 40.0 years (60.0 ttl pk-yrs)    Types: Cigarettes    Start date: 01/19/1957    Quit date: 01/19/1997    Years since quitting: 26.8   Smokeless tobacco: Never  Vaping Use   Vaping status: Never Used  Substance Use Topics   Alcohol use: No   Drug use: No     Allergies   Lamisil [terbinafine hcl]   Review of Systems Review of Systems Per HPI  Physical Exam Triage Vital Signs ED Triage Vitals  Encounter Vitals Group     BP 11/16/23 1115 112/71     Girls Systolic BP Percentile --      Girls Diastolic BP Percentile --      Boys Systolic BP Percentile --      Boys Diastolic BP Percentile --      Pulse Rate 11/16/23 1115 66     Resp 11/16/23 1115 20     Temp 11/16/23 1115 (!) 97.5 F (36.4 C)     Temp Source 11/16/23 1115 Oral     SpO2 11/16/23 1115 95 %     Weight --      Height --      Head Circumference --      Peak Flow --       Pain Score 11/16/23 1113 5     Pain Loc --      Pain Education --      Exclude from Growth Chart --    No data found.  Updated Vital Signs BP 112/71 (BP Location: Right Arm)   Pulse 66   Temp (!) 97.5 F (36.4 C) (Oral)   Resp 20   SpO2 95%   Visual Acuity Right Eye Distance:   Left Eye Distance:   Bilateral Distance:    Right Eye Near:   Left Eye Near:    Bilateral Near:     Physical Exam Vitals and nursing note reviewed.  Constitutional:      Appearance: Normal appearance.  HENT:     Head: Atraumatic.  Eyes:     Extraocular Movements: Extraocular movements intact.     Conjunctiva/sclera: Conjunctivae normal.  Cardiovascular:     Rate and Rhythm: Normal rate.  Pulmonary:     Effort: Pulmonary effort is normal.  Musculoskeletal:        General: Swelling present. Normal range of motion.     Cervical back: Normal range of motion and neck supple.     Comments: Bilateral lower leg edema where the rash is worse  Skin:    General: Skin is warm.     Findings: Rash present.     Comments: Diffuse scabbing, pinpoint papular erythematous lesions widespread across entire body.  Diffuse erythema and edema to bilateral lower legs  Neurological:     Mental Status: He is oriented to person, place, and time.  Psychiatric:        Mood and Affect: Mood normal.        Thought Content: Thought content normal.        Judgment: Judgment normal.      UC Treatments / Results  Labs (all labs ordered are listed, but only abnormal results are displayed) Labs Reviewed - No data to display  EKG   Radiology No results found.  Procedures Procedures (including critical care time)  Medications Ordered in UC Medications  methylPREDNISolone  acetate (DEPO-MEDROL ) injection 40 mg (has no administration in time range)    Initial Impression / Assessment and Plan / UC Course  I have reviewed the triage vital signs and the nursing notes.  Pertinent labs & imaging results that  were available during my care of the patient were reviewed by me and considered in my medical decision making (see chart for details).     Will treat with Depo-Medrol  IM, retreat with permethrin  1 week from first treatment, and give Hibiclens , Keflex  for developing cellulitis to bilateral lower legs in the areas of rash that have been scratched.  Return for worsening symptoms.  Final Clinical Impressions(s) / UC Diagnoses   Final diagnoses:  Rash  Cellulitis of lower extremity, unspecified laterality     Discharge Instructions      Clean the open areas with the Hibiclens  once to twice daily.  We have given you a steroid shot today to help with the itching and inflammation and an antibiotic because the areas around your lower legs appear to be getting a secondary bacterial infection.  Elevate your legs at rest to help with swelling.  I do recommend retreating with the permethrin  1 week after first treatment.    ED Prescriptions     Medication Sig Dispense Auth. Provider   cephALEXin  (KEFLEX ) 500 MG capsule Take 1 capsule (500 mg total) by mouth 2 (two) times daily. 14 capsule Stuart Vernell Norris, PA-C   chlorhexidine  (HIBICLENS ) 4 % external liquid Apply topically daily as needed. 236 mL Stuart Vernell Norris, [place]      PDMP not reviewed this encounter.   Stuart Vernell Norris, [place] 11/16/23  1217  

## 2023-11-16 NOTE — ED Triage Notes (Signed)
 Pt reports was seen and diagnosed with scabies on Monday. Pt reports is currently on steroid cream, hydroxine, and permethrin . Reports its everywhere and the itching isn't better.

## 2023-11-22 ENCOUNTER — Encounter: Payer: Self-pay | Admitting: Family Medicine

## 2023-11-22 ENCOUNTER — Ambulatory Visit (INDEPENDENT_AMBULATORY_CARE_PROVIDER_SITE_OTHER): Admitting: Family Medicine

## 2023-11-22 ENCOUNTER — Ambulatory Visit: Admitting: Family Medicine

## 2023-11-22 VITALS — BP 115/66 | HR 74 | Temp 97.5°F | Ht 70.0 in | Wt 183.0 lb

## 2023-11-22 DIAGNOSIS — Z01818 Encounter for other preprocedural examination: Secondary | ICD-10-CM

## 2023-11-22 LAB — COAGUCHEK XS/INR WAIVED
INR: 1.1 (ref 0.9–1.1)
Prothrombin Time: 12.7 s

## 2023-11-22 NOTE — Progress Notes (Signed)
 BP 115/66   Pulse 74   Temp (!) 97.5 F (36.4 C) (Temporal)   Ht 5' 10 (1.778 m)   Wt 183 lb (83 kg)   SpO2 98%   BMI 26.26 kg/m    Subjective:   Patient ID: [name], male    DOB: Jan 07, 1936, 88 y.o.   MRN: [id number]  HPI: [name] is a 88 y.o. male presenting on 11/22/2023 for Surgical Clearance (ProAct with cystoscopy)   Discussed the use of AI scribe software for clinical note transcription with the patient, who gave verbal consent to proceed.  History of Present Illness   [name] is an 88 year old male who presents for a preoperative physical examination.  He is preparing for a urological procedure involving the insertion of a Proact device, which will aid in bladder control by placing two balloons where his prostate was. The procedure may require three or four adjustments, and he might need a catheter for a few days post-procedure.  He has a skin condition persisting for three weeks, characterized by scabies and sores, particularly on his ankles. Despite undergoing treatment twice, the condition persists. He uses lotion and cortisone to manage it and has controlled the itch in the last two days. He takes Zyrtec  and uses Benadryl during the day.  His prior EKGs have shown sinus rhythm with frequent PVCs and a right hemiblock, with a heart rate of 98. He experiences no chest pain or shortness of breath with walking.  He lives with four other people, none of whom have developed the skin condition. He has not traveled recently or stayed in hotels but went camping in his own camper. He speculates that he might have contracted the condition from another person's pet.          Relevant past medical, surgical, family and social history reviewed and updated as indicated. Interim medical history since our last visit reviewed. Allergies and medications reviewed and updated.  Review of Systems  Constitutional:  Negative for chills and fever.  Eyes:  Negative  for visual disturbance.  Respiratory:  Negative for shortness of breath and wheezing.   Cardiovascular:  Negative for chest pain and leg swelling.  Musculoskeletal:  Negative for back pain and gait problem.  Skin:  Positive for rash.  Neurological:  Negative for dizziness and light-headedness.  All other systems reviewed and are negative.   Per HPI unless specifically indicated above   Allergies as of 11/22/2023       Reactions   Lamisil [terbinafine Hcl] Other (See Comments)   Loss of taste         Medication List        Accurate as of November 22, 2023  2:35 PM. If you have any questions, ask your nurse or doctor.          albuterol  108 (90 Base) MCG/ACT inhaler Commonly known as: ProAir  HFA 2 puffs every 4 hours as needed only  if your can't catch your breath   amLODipine -olmesartan  10-40 MG tablet Commonly known as: AZOR  Take 1 tablet by mouth daily.   aspirin 81 MG tablet Take 81 mg by mouth 2 (two) times daily.   cephALEXin  500 MG capsule Commonly known as: KEFLEX  Take 1 capsule (500 mg total) by mouth 2 (two) times daily.   cetirizine  10 MG tablet Commonly known as: ZYRTEC  Take 1 tablet (10 mg total) by mouth daily.   chlorhexidine  4 % external liquid Commonly known as: Hibiclens  Apply topically daily  as needed.   donepezil  10 MG tablet Commonly known as: ARICEPT  Take 1 tablet (10 mg total) by mouth at bedtime.   fenofibrate  48 MG tablet Commonly known as: Tricor  Take 1 tablet (48 mg total) by mouth daily.   hydrOXYzine  10 MG tablet Commonly known as: ATARAX  Take 1 tablet (10 mg total) by mouth 3 (three) times daily as needed for itching. Do not take with benadryl   IRON COMPLEX PO Take 60 mg by mouth daily.   mirabegron  ER 50 MG Tb24 tablet Commonly known as: Myrbetriq  Take 1 tablet (50 mg total) by mouth daily.   pantoprazole  40 MG tablet Commonly known as: PROTONIX  Take 1 tablet (40 mg total) by mouth daily.   permethrin  5 %  cream Commonly known as: ELIMITE  Apply and massage in cream from neck to toes. Leave on for 8 to 14 hours before washing off with water. May reapply in 14 days if live mites appear.   QUEtiapine  100 MG tablet Commonly known as: SEROQUEL  Take 1 tablet (100 mg total) by mouth at bedtime.   Repatha  SureClick 140 MG/ML Soaj Generic drug: Evolocumab  Inject 140 mg into the skin every 14 (fourteen) days. Inject 1 pen subcutaneously every 14 days.  Needs appt for further fills   Stiolto Respimat  2.5-2.5 MCG/ACT Aers Generic drug: Tiotropium Bromide-Olodaterol Inhale 2 Inhalations into the lungs daily.   Vitamin D3 125 MCG (5000 UT) Caps Take 1 capsule (5,000 Units total) by mouth daily.         Objective:   BP 115/66   Pulse 74   Temp (!) 97.5 F (36.4 C) (Temporal)   Ht 5' 10 (1.778 m)   Wt 183 lb (83 kg)   SpO2 98%   BMI 26.26 kg/m   Wt Readings from Last 3 Encounters:  11/22/23 183 lb (83 kg)  11/13/23 181 lb (82.1 kg)  09/20/23 182 lb (82.6 kg)    Physical Exam Physical Exam   CHEST: Lungs clear to auscultation bilaterally. CARDIOVASCULAR: Heart sounds normal, regular rate and rhythm.      Rash of scabs and bites mostly on ankles but has some in other places on his body for   Assessment & Plan:   Problem List Items Addressed This Visit   None Visit Diagnoses       Preoperative clearance    -  Primary   Relevant Orders   EKG 12-Lead (Completed)   BMP8+EGFR   CoaguChek XS/INR Waived           Preoperative Evaluation for Urologic Procedure Preoperative evaluation for urologic procedure with Proact device insertion. Sinus rhythm with frequent PVCs noted. No chest pain or dyspnea reported. Procedure may require post-op catheter and multiple adjustments. - Stop aspirin 5 days before procedure. - Proceed with scheduled urologic procedure.  Scabies, treated and healing Scabies in healing phase post-treatment. Itching controlled with moisturizer and cortisone  cream. - Continue using moisturizer and cortisone cream. - Consider Benadryl for itching.  Frequent premature ventricular contractions (PVCs) Frequent PVCs on EKG, consistent with previous findings. No new symptoms reported. - Monitor for new symptoms such as chest pain or shortness of breath.          Follow up plan: Return if symptoms worsen or fail to improve.  Counseling provided for all of the vaccine components Orders Placed This Encounter  Procedures   BMP8+EGFR   CoaguChek XS/INR Waived   EKG 12-Lead    Fonda Levins, MD [facility] 11/22/2023, 2:35  PM

## 2023-11-23 ENCOUNTER — Ambulatory Visit: Payer: Self-pay | Admitting: Family Medicine

## 2023-11-23 DIAGNOSIS — N289 Disorder of kidney and ureter, unspecified: Secondary | ICD-10-CM

## 2023-11-23 LAB — BMP8+EGFR
BUN/Creatinine Ratio: 10 (ref 10–24)
BUN: 16 mg/dL (ref 8–27)
CO2: 22 mmol/L (ref 20–29)
Calcium: 10 mg/dL (ref 8.6–10.2)
Chloride: 102 mmol/L (ref 96–106)
Creatinine, Ser: 1.58 mg/dL — ABNORMAL HIGH (ref 0.76–1.27)
Glucose: 74 mg/dL (ref 70–99)
Potassium: 4.6 mmol/L (ref 3.5–5.2)
Sodium: 140 mmol/L (ref 134–144)
eGFR: 43 mL/min/1.73 — ABNORMAL LOW (ref >59)

## 2023-11-30 ENCOUNTER — Other Ambulatory Visit

## 2023-11-30 DIAGNOSIS — N289 Disorder of kidney and ureter, unspecified: Secondary | ICD-10-CM | POA: Diagnosis not present

## 2023-11-30 LAB — BMP8+EGFR
BUN/Creatinine Ratio: 9 — ABNORMAL LOW (ref 10–24)
BUN: 16 mg/dL (ref 8–27)
CO2: 21 mmol/L (ref 20–29)
Calcium: 9.9 mg/dL (ref 8.6–10.2)
Chloride: 101 mmol/L (ref 96–106)
Creatinine, Ser: 1.88 mg/dL — ABNORMAL HIGH (ref 0.76–1.27)
Glucose: 97 mg/dL (ref 70–99)
Potassium: 4.8 mmol/L (ref 3.5–5.2)
Sodium: 137 mmol/L (ref 134–144)
eGFR: 35 mL/min/1.73 — ABNORMAL LOW (ref >59)

## 2023-12-01 ENCOUNTER — Ambulatory Visit: Payer: Self-pay | Admitting: Family Medicine

## 2023-12-01 DIAGNOSIS — N289 Disorder of kidney and ureter, unspecified: Secondary | ICD-10-CM

## 2023-12-02 ENCOUNTER — Emergency Department (HOSPITAL_COMMUNITY)
Admission: EM | Admit: 2023-12-02 | Discharge: 2023-12-02 | Disposition: A | Discharge status: Home / Self Care | Attending: Emergency Medicine | Admitting: Emergency Medicine

## 2023-12-02 ENCOUNTER — Other Ambulatory Visit: Payer: Self-pay

## 2023-12-02 ENCOUNTER — Encounter (HOSPITAL_COMMUNITY): Payer: Self-pay

## 2023-12-02 DIAGNOSIS — Z7982 Long term (current) use of aspirin: Secondary | ICD-10-CM | POA: Diagnosis not present

## 2023-12-02 DIAGNOSIS — I10 Essential (primary) hypertension: Secondary | ICD-10-CM | POA: Diagnosis not present

## 2023-12-02 DIAGNOSIS — J449 Chronic obstructive pulmonary disease, unspecified: Secondary | ICD-10-CM | POA: Diagnosis not present

## 2023-12-02 DIAGNOSIS — R7989 Other specified abnormal findings of blood chemistry: Secondary | ICD-10-CM | POA: Diagnosis present

## 2023-12-02 DIAGNOSIS — N179 Acute kidney failure, unspecified: Secondary | ICD-10-CM | POA: Diagnosis not present

## 2023-12-02 DIAGNOSIS — Z8546 Personal history of malignant neoplasm of prostate: Secondary | ICD-10-CM | POA: Diagnosis not present

## 2023-12-02 LAB — BASIC METABOLIC PANEL WITH GFR
Anion gap: 11 (ref 5–15)
BUN: 19 mg/dL (ref 8–23)
CO2: 22 mmol/L (ref 22–32)
Calcium: 10 mg/dL (ref 8.9–10.3)
Chloride: 105 mmol/L (ref 98–111)
Creatinine, Ser: 1.97 mg/dL — ABNORMAL HIGH (ref 0.61–1.24)
GFR, Estimated: 33 mL/min — ABNORMAL LOW (ref >60)
Glucose, Bld: 107 mg/dL — ABNORMAL HIGH (ref 70–99)
Potassium: 4.7 mmol/L (ref 3.5–5.1)
Sodium: 138 mmol/L (ref 135–145)

## 2023-12-02 LAB — CBC WITH DIFFERENTIAL/PLATELET
Abs Immature Granulocytes: 0.04 K/uL (ref 0.00–0.07)
Basophils Absolute: 0.1 K/uL (ref 0.0–0.1)
Basophils Relative: 1 %
Eosinophils Absolute: 1.6 K/uL — ABNORMAL HIGH (ref 0.0–0.5)
Eosinophils Relative: 17 %
HCT: 44.1 % (ref 39.0–52.0)
Hemoglobin: 14.2 g/dL (ref 13.0–17.0)
Immature Granulocytes: 0 %
Lymphocytes Relative: 19 %
Lymphs Abs: 1.7 K/uL (ref 0.7–4.0)
MCH: 31.2 pg (ref 26.0–34.0)
MCHC: 32.2 g/dL (ref 30.0–36.0)
MCV: 96.9 fL (ref 80.0–100.0)
Monocytes Absolute: 0.7 K/uL (ref 0.1–1.0)
Monocytes Relative: 7 %
Neutro Abs: 5.2 K/uL (ref 1.7–7.7)
Neutrophils Relative %: 56 %
Platelets: 302 K/uL (ref 150–400)
RBC: 4.55 MIL/uL (ref 4.22–5.81)
RDW: 13.8 % (ref 11.5–15.5)
WBC: 9.4 K/uL (ref 4.0–10.5)
nRBC: 0 % (ref 0.0–0.2)

## 2023-12-02 LAB — URINALYSIS, COMPLETE (UACMP) WITH MICROSCOPIC
Bacteria, UA: NONE SEEN
Bilirubin Urine: NEGATIVE
Glucose, UA: NEGATIVE mg/dL
Hgb urine dipstick: NEGATIVE
Ketones, ur: NEGATIVE mg/dL
Leukocytes,Ua: NEGATIVE
Nitrite: NEGATIVE
Protein, ur: NEGATIVE mg/dL
Specific Gravity, Urine: 1.003 — ABNORMAL LOW (ref 1.005–1.030)
pH: 7 (ref 5.0–8.0)

## 2023-12-02 MED ORDER — HYDROXYZINE HCL 10 MG PO TABS: 10.0000 mg | ORAL_TABLET | Freq: Four times a day (QID) | ORAL | 0 refills | Status: AC | PRN

## 2023-12-02 MED ORDER — SODIUM CHLORIDE 0.9 % IV BOLUS
1000.0000 mL | Freq: Once | INTRAVENOUS | Status: AC
  Administered 2023-12-02: 1000 mL via INTRAVENOUS

## 2023-12-02 MED ORDER — HYDROXYZINE HCL 25 MG PO TABS
25.0000 mg | ORAL_TABLET | Freq: Once | ORAL | Status: AC
  Administered 2023-12-02: 25 mg via ORAL
  Filled 2023-12-02: qty 1

## 2023-12-02 NOTE — ED Provider Notes (Addendum)
 [place] EMERGENCY DEPARTMENT AT [facility] Provider Note  CSN: [id number] Arrival date & time: 12/02/23 1116  Chief Complaint(s) Abnormal Labs  HPI [name] is a 88 y.o. male history of COPD, GERD, hypertension, hyperlipidemia presenting to the emergency department with abnormal lab.  Patient reports that he has overall been feeling well.  Has been dealing with a rash that was diagnosed as possible scabies.  This has been improving but he still feels itchy occasionally.  His primary doctor has noticed that he has had an uptrending creatinine and advised that he come to the ER.  He reports that he drinks lots of fluids.  Denies urinary symptoms, back pain.  Denies any fevers or chills.  Feels that he is completely emptying his bladder.  Overall feels well aside from some mild persistent itching.   Past Medical History Past Medical History:  Diagnosis Date   Adenomatous colon polyp 11/1999   Allergy    Blood transfusion without reported diagnosis    Cancer [facility])    Prostate   Carotid artery occlusion    Cataract    COPD (chronic obstructive pulmonary disease) (HCC)    GERD (gastroesophageal reflux disease)    Hyperlipidemia    Hypertension    Internal hemorrhoids    PVC (premature ventricular contraction)    Patient Active Problem List   Diagnosis Date Noted   Psychophysiological insomnia 06/11/2021   PVC's (premature ventricular contractions) 05/03/2017   Overactive bladder 06/08/2015   Occlusion and stenosis of carotid artery without mention of cerebral infarction 01/31/2012   History of prostate cancer 08/20/2008   Hyperlipidemia LDL goal <100 08/20/2008   ULNAR NEUROPATHY 08/20/2008   HTN (hypertension), benign 08/20/2008   Coronary atherosclerosis 08/20/2008   CEREBROVASCULAR DISEASE 08/20/2008   COPD GOLD II 09/24/2007   Home Medication(s) Prior to Admission medications   Medication Sig Start Date End Date Taking? Authorizing Provider  albuterol   (PROAIR  HFA) 108 (90 Base) MCG/ACT inhaler 2 puffs every 4 hours as needed only  if your can't catch your breath 11/24/21  Yes Darlean Ozell NOVAK, MD  aspirin 81 MG tablet Take 162 mg by mouth daily.   Yes [provider]  cetirizine  (ZYRTEC ) 10 MG tablet Take 1 tablet (10 mg total) by mouth daily. 03/20/23  Yes Dettinger, Fonda LABOR, MD  Cholecalciferol (VITAMIN D3) 125 MCG (5000 UT) CAPS Take 1 capsule (5,000 Units total) by mouth daily. 03/20/23  Yes Dettinger, Fonda LABOR, MD  Evolocumab  (REPATHA  SURECLICK) 140 MG/ML SOAJ Inject 140 mg into the skin every 14 (fourteen) days. Inject 1 pen subcutaneously every 14 days.  Needs appt for further fills 03/20/23  Yes Dettinger, Fonda LABOR, MD  fenofibrate  (TRICOR ) 48 MG tablet Take 1 tablet (48 mg total) by mouth daily. 03/24/23  Yes Dettinger, Fonda LABOR, MD  Iron Combinations (IRON COMPLEX PO) Take 60 mg by mouth daily.   Yes [provider]  mirabegron  ER (MYRBETRIQ ) 50 MG TB24 tablet Take 1 tablet (50 mg total) by mouth daily. 03/20/23  Yes Dettinger, Fonda LABOR, MD  pantoprazole  (PROTONIX ) 40 MG tablet Take 1 tablet (40 mg total) by mouth daily. 09/20/23  Yes Dettinger, Fonda LABOR, MD  QUEtiapine  (SEROQUEL ) 100 MG tablet Take 1 tablet (100 mg total) by mouth at bedtime. 09/20/23  Yes Dettinger, Fonda LABOR, MD  Tiotropium Bromide-Olodaterol (STIOLTO RESPIMAT ) 2.5-2.5 MCG/ACT AERS Inhale 2 Inhalations into the lungs daily. 03/20/23  Yes Dettinger, Fonda LABOR, MD  Past Surgical History Past Surgical History:  Procedure Laterality Date   APPENDECTOMY     carotid surgery     CATARACT EXTRACTION     both eyes   COLONOSCOPY     POLYPECTOMY     TRANSURETHRAL RESECTION OF PROSTATE     UPPER GASTROINTESTINAL ENDOSCOPY     UPPER GI ENDOSCOPY  Nov. 3, 2014   Upper endoscopy with diatation   Family History Family History  Problem  Relation Age of Onset   Colon cancer Mother    Cancer Mother        colon   Heart disease Father        Heart Disease before age 21   Heart attack Father 22   Arthritis Sister    Esophageal cancer Neg Hx    Rectal cancer Neg Hx    Stomach cancer Neg Hx    Colon polyps Neg Hx     Social History Social History   Tobacco Use   Smoking status: Former    Current packs/day: 0.00    Average packs/day: 1.5 packs/day for 40.0 years (60.0 ttl pk-yrs)    Types: Cigarettes    Start date: 01/19/1957    Quit date: 01/19/1997    Years since quitting: 26.8   Smokeless tobacco: Never  Vaping Use   Vaping status: Never Used  Substance Use Topics   Alcohol use: No   Drug use: No   Allergies Lamisil [terbinafine hcl]  Review of Systems Review of Systems  All other systems reviewed and are negative.   Physical Exam Vital Signs  I have reviewed the triage vital signs BP 115/75   Pulse 81   Temp (!) 97 F (36.1 C)   Resp 17   Ht 5' 10 (1.778 m)   Wt 82.6 kg   SpO2 93%   BMI 26.11 kg/m  Physical Exam Vitals and nursing note reviewed.  Constitutional:      General: He is not in acute distress.    Appearance: Normal appearance.  HENT:     Mouth/Throat:     Mouth: Mucous membranes are moist.  Eyes:     Conjunctiva/sclera: Conjunctivae normal.  Cardiovascular:     Rate and Rhythm: Normal rate and regular rhythm.  Pulmonary:     Effort: Pulmonary effort is normal. No respiratory distress.     Breath sounds: Normal breath sounds.  Abdominal:     General: Abdomen is flat.     Palpations: Abdomen is soft.     Tenderness: There is no abdominal tenderness. There is no right CVA tenderness or left CVA tenderness.  Musculoskeletal:     Right lower leg: No edema.     Left lower leg: No edema.  Skin:    General: Skin is warm and dry.     Capillary Refill: Capillary refill takes less than 2 seconds.  Neurological:     Mental Status: He is alert and oriented to person, place,  and time. Mental status is at baseline.  Psychiatric:        Mood and Affect: Mood normal.        Behavior: Behavior normal.     ED Results and Treatments Labs (all labs ordered are listed, but only abnormal results are displayed) Labs Reviewed  CBC WITH DIFFERENTIAL/PLATELET - Abnormal; Notable for the following components:      Result Value   Eosinophils Absolute 1.6 (*)    All other components within normal limits  BASIC METABOLIC PANEL WITH GFR -  Abnormal; Notable for the following components:   Glucose, Bld 107 (*)    Creatinine, Ser 1.97 (*)    GFR, Estimated 33 (*)    All other components within normal limits  URINALYSIS, COMPLETE (UACMP) WITH MICROSCOPIC - Abnormal; Notable for the following components:   Color, Urine COLORLESS (*)    Specific Gravity, Urine 1.003 (*)    All other components within normal limits                                                                                                                          Radiology No results found.  Pertinent labs & imaging results that were available during my care of the patient were reviewed by me and considered in my medical decision making (see MDM for details).  Medications Ordered in ED Medications  sodium chloride  0.9 % bolus 1,000 mL (0 mLs Intravenous Stopped 12/02/23 1347)  hydrOXYzine  (ATARAX ) tablet 25 mg (25 mg Oral Given 12/02/23 1258)                                                                                                                                     Procedures Procedures  (including critical care time)  Medical Decision Making / ED Course   MDM:  88 year old presenting to the emergency department abnormal lab.  Patient is overall very well-appearing, physical examination with some scattered areas of rash that appear nonspecific.  Not consistent with acute infection  Unclear cause of elevated creatinine.  Consider post obstructive process, patient reports he is urinating  well, PVR is zero.  Will also obtain urinalysis.  Creatinine is elevated.  Differentials includes dehydration, patient reports he is drinking lots of fluids, but creatinine has continued to rise.  Will reevaluate fluids.  Patient does not appear volume overloaded to suggest CHF as cause of AKI.  Will reassess. Given failure of outpatient management may need admission  Clinical Course as of 12/02/23 1529  Sat Dec 02, 2023  1522 Discussed admission with Dr. Bryn with hospitalist service, he discussed with Dr. Norine with nephrology, she did not feel that patient needed to stay in the hospital since this has been more of a gradual decline.  She did recommend that we discontinue his olmesartan  and I have removed this from his medication list.  Pending urinalysis.   [WS]  1529 Urinalysis bland. Will discharge patient to home. All  questions answered. Patient comfortable with plan of discharge. Return precautions discussed with patient and specified on the after visit summary.  [WS]    Clinical Course User Index [WS] Francesca, Elsie CROME, MD     Additional history obtained: -Additional history obtained from family -External records from outside source obtained and reviewed including: Chart review including previous notes, labs, imaging, consultation notes including prior notes    Lab Tests: -I ordered, reviewed, and interpreted labs.   The pertinent results include:   Labs Reviewed  CBC WITH DIFFERENTIAL/PLATELET - Abnormal; Notable for the following components:      Result Value   Eosinophils Absolute 1.6 (*)    All other components within normal limits  BASIC METABOLIC PANEL WITH GFR - Abnormal; Notable for the following components:   Glucose, Bld 107 (*)    Creatinine, Ser 1.97 (*)    GFR, Estimated 33 (*)    All other components within normal limits  URINALYSIS, COMPLETE (UACMP) WITH MICROSCOPIC - Abnormal; Notable for the following components:   Color, Urine COLORLESS (*)    Specific  Gravity, Urine 1.003 (*)    All other components within normal limits    Notable for elevated cr  Medicines ordered and prescription drug management: Meds ordered this encounter  Medications   sodium chloride  0.9 % bolus 1,000 mL   hydrOXYzine  (ATARAX ) tablet 25 mg    -I have reviewed the patients home medicines and have made adjustments as needed     Co morbidities that complicate the patient evaluation  Past Medical History:  Diagnosis Date   Adenomatous colon polyp 11/1999   Allergy    Blood transfusion without reported diagnosis    Cancer (HCC)    Prostate   Carotid artery occlusion    Cataract    COPD (chronic obstructive pulmonary disease) (HCC)    GERD (gastroesophageal reflux disease)    Hyperlipidemia    Hypertension    Internal hemorrhoids    PVC (premature ventricular contraction)       Dispostion: Disposition decision including need for hospitalization was considered, and patient discharged from emergency department.    Final Clinical Impression(s) / ED Diagnoses Final diagnoses:  AKI (acute kidney injury) (HCC)     This chart was dictated using voice recognition software.  Despite best efforts to proofread,  errors can occur which can change the documentation meaning.    Francesca Elsie CROME, MD 12/02/23 1523    Francesca Elsie CROME, MD 12/02/23 [phone]

## 2023-12-02 NOTE — ED Triage Notes (Addendum)
 Pt stated that his PCP recommended he come to the ED due to increasing creatinine levels. Pt also stated that he has been treated twice in the last 4 weeks for scabies. Pt stated that the scabies are gone but he is still itchy

## 2023-12-02 NOTE — ED Notes (Signed)
 Pt/family received d/c paperwork at this time. After going over the paperwork any questions, comments, or concerns were answered to the best of this nurse's knowledge. The pt/family verbally acknowledged the teachings/instructions.

## 2023-12-02 NOTE — Discharge Instructions (Addendum)
 We evaluated you for your abnormal kidney function.  Your testing was stable from yesterday.  Your kidney function has been slowly decreasing over the last few weeks, so the hospital doctors and nephrologist did not think you needed to stay in the hospital.  The nephrologist did want you to stop your olmesartan  as this can cause problems with abnormal kidney function.  Please follow-up closely with your primary doctor.  We would also like you to schedule follow-up with nephrology.  You can call Dr. Norine for follow-up with nephrology.  Please return if you develop any new or worsening symptoms.

## 2023-12-04 ENCOUNTER — Telehealth: Payer: Self-pay | Admitting: Family Medicine

## 2023-12-04 NOTE — Telephone Encounter (Signed)
 Patient return call. ?

## 2023-12-04 NOTE — Telephone Encounter (Signed)
 Pt states he has taken his blood pressure twice. 129/78 and heart rate 69 then 134/86, heart rate 72  Informed pt to keep checking it twice daily for the next two weeks and let us  know the readings. He will call back in one week if BP is staying around 150-90. Advised pt that I would call him back if Dettinger had any other recommendations.

## 2023-12-04 NOTE — Telephone Encounter (Signed)
 Pt made aware.

## 2023-12-04 NOTE — Telephone Encounter (Signed)
 Yes let me know what his blood pressure is running now that he is out of the hospital

## 2023-12-04 NOTE — Telephone Encounter (Signed)
 Duplicate message. Nurse has already spoken with patient. Will close this encounter.

## 2023-12-04 NOTE — Telephone Encounter (Signed)
 Copied from CRM #[id number]. Topic: Clinical - Medication Question >> Dec 04, 2023  9:54 AM Antwanette L wrote: Reason for CRM: Patient was seen at [facility] ER at [facility] on 12/01/13. The hospital discontinue his blood pressure medication (amlodipine  olmesartan  10 40 mg). The patient has no symptoms.The patient is requesting a new prescription for blood pressure. The patient is requesting a callback at [phone] >> Dec 04, 2023  9:58 AM Antwanette L wrote: Patient did not want a hospital follow up appt w/ Dr. Maryanne.

## 2023-12-04 NOTE — Telephone Encounter (Signed)
 Copied from CRM #[id number]. Topic: General - Other >> Dec 04, 2023 12:17 PM Delon DASEN wrote: Reason for CRM: returning [name]'s call- [phone]

## 2023-12-04 NOTE — Telephone Encounter (Signed)
 Asked pt to take his BP. He states that he is not at home right now. When he gets home he will check it and call back with reading. Will route to Dettinger to make aware.

## 2023-12-04 NOTE — Telephone Encounter (Signed)
 Yes I agree, have him continue to monitor.

## 2023-12-06 ENCOUNTER — Telehealth: Payer: Self-pay

## 2023-12-06 NOTE — Telephone Encounter (Signed)
 Patient called to leave a message for Rosina to return his call as he has information from the referral he requested. Please f/u with patient

## 2023-12-06 NOTE — Telephone Encounter (Signed)
 Surgical clearance form, last labs and EKG faxed to [facility]. Dr. Maryanne could not approve the surgery due to kidney function. Pt has been referred to nephrology. They will need to approve his Insertion of ProAct, Cystoscopy.  Faxed [facility] [phone]

## 2023-12-06 NOTE — Telephone Encounter (Signed)
 Pt is upset that the kidney specialist office cannot tell him whether his referral is there are not. They did make him aware that a provider there would review the referral to see how critical it is and then he would get a call. He was instructed to call the back on Friday for an update.  Will route to referral to see if they have any suggestions to make sure they did see referral and to speed up getting an appt.

## 2023-12-06 NOTE — Telephone Encounter (Signed)
 Copied from CRM [id number]. Topic: Clinical - Medical Advice >> Dec 06, 2023 10:44 AM Harlene ORN wrote: Reason for CRM:  Patient called for nurse Rosina. He has spoken with the kidney center. They have no urgent request for him to be seen.  If any questions, please call back to ask the patient.

## 2023-12-06 NOTE — Telephone Encounter (Signed)
 Pt made aware that referral is placed. Contact to [facility] given to pt. He was told by them on 9/16 that the referral was not in. Informed pt that it is. He will call for an appt.

## 2023-12-08 ENCOUNTER — Telehealth: Payer: Self-pay

## 2023-12-08 ENCOUNTER — Other Ambulatory Visit: Payer: Self-pay

## 2023-12-08 MED ORDER — AMLODIPINE BESYLATE 10 MG PO TABS: 10.0000 mg | ORAL_TABLET | Freq: Every day | ORAL | 0 refills | Status: DC

## 2023-12-08 NOTE — Telephone Encounter (Signed)
 Pt called to report BP readings since d/c amlodipine -olmesartan  by the ER provider.SABRA His readings are from 150's-170's systolic and 90's diastolic. Pt denies any heart attack symptoms or stroke symptoms.  Per Dr. Maryanne pt should start amlodipine  10mg  daily. Check BP over the weekend and call back on Monday with an update. Pt aware to go to ER if he develops any symptoms.  Amlodipine  sent to [facility].

## 2023-12-14 DIAGNOSIS — N179 Acute kidney failure, unspecified: Secondary | ICD-10-CM | POA: Diagnosis not present

## 2023-12-14 DIAGNOSIS — N1832 Chronic kidney disease, stage 3b: Secondary | ICD-10-CM | POA: Diagnosis not present

## 2023-12-14 DIAGNOSIS — B86 Scabies: Secondary | ICD-10-CM | POA: Diagnosis not present

## 2023-12-14 DIAGNOSIS — I129 Hypertensive chronic kidney disease with stage 1 through stage 4 chronic kidney disease, or unspecified chronic kidney disease: Secondary | ICD-10-CM | POA: Diagnosis not present

## 2023-12-14 DIAGNOSIS — J449 Chronic obstructive pulmonary disease, unspecified: Secondary | ICD-10-CM | POA: Diagnosis not present

## 2023-12-14 DIAGNOSIS — E785 Hyperlipidemia, unspecified: Secondary | ICD-10-CM | POA: Diagnosis not present

## 2023-12-25 ENCOUNTER — Ambulatory Visit: Admitting: Urology

## 2024-01-09 ENCOUNTER — Other Ambulatory Visit: Payer: Self-pay | Admitting: Medical Genetics

## 2024-01-09 DIAGNOSIS — Z006 Encounter for examination for normal comparison and control in clinical research program: Secondary | ICD-10-CM

## 2024-01-15 ENCOUNTER — Ambulatory Visit: Payer: Self-pay

## 2024-01-15 NOTE — Telephone Encounter (Signed)
 Appt made.

## 2024-01-15 NOTE — Telephone Encounter (Signed)
 FYI Only or Action Required?: Action required by provider: update on patient condition.  Patient was last seen in primary care on 11/22/2023 by Dettinger, Fonda LABOR, MD.  Called Nurse Triage reporting Sarcoptes Scabiei and Pruritis.  Symptoms began several months ago.  Interventions attempted: Prescription medications: triamcinolone , abx, steroid,permetherin .  Symptoms are: unchanged.  Triage Disposition: See PCP When Office is Open (Within 3 Days)  Patient/caregiver understands and will follow disposition?: Yes  Copied from CRM [id number]. Topic: Clinical - Red Word Triage >> Jan 15, 2024  2:26 PM Avram MATSU wrote: Red Word that prompted transfer to Nurse Triage: scabies, itches bad and its getting worse. Reason for Disposition  [1] SEVERE itching (interferes with sleep or activities) AND [2] not improved after 48 hours of over-the-counter antihistamines and steroid cream  Answer Assessment - Initial Assessment Questions 1. DIAGNOSIS CONFIRMATION: Who diagnosed your scabies? When?      Diagnosed with scabies at [facility] on 11/16/2023 2. ONSET:  When did the scabies rash begin?     11/12/2023 3. LOCATION: Where do you have the scabies rash?     Not sure if he has rash or if the redness is from his scratching States that it just depends, but move from legs, back, to buttocks 4. SYMPTOMS: What symptoms are you most concerned about today?       itching 5. ITCHING: How bad is the itching?     severe 6. INFECTION: Does the rash look infected? (e.g., spreading redness, tenderness, pus)      denies 7. TREATMENT: What medicine(s) were you prescribed? What's happened since you took or applied the medicine?     Atarax , triamcinolone , pemetherin(states that he does not have) 8. CLOSE CONTACTS: Does any person who lives with you or has had close contact with you have itching? If Yes, ask: Was the person(s) treated for scabies?     Denies, lives alone  Protocols used: Scabies  Follow-up Call-A-AH

## 2024-01-16 ENCOUNTER — Encounter: Payer: Self-pay | Admitting: Family

## 2024-01-16 ENCOUNTER — Ambulatory Visit: Admitting: Family

## 2024-01-16 VITALS — BP 150/72 | HR 102 | Temp 97.3°F | Ht 70.0 in | Wt 176.4 lb

## 2024-01-16 DIAGNOSIS — R5383 Other fatigue: Secondary | ICD-10-CM

## 2024-01-16 DIAGNOSIS — Z09 Encounter for follow-up examination after completed treatment for conditions other than malignant neoplasm: Secondary | ICD-10-CM

## 2024-01-16 DIAGNOSIS — N179 Acute kidney failure, unspecified: Secondary | ICD-10-CM | POA: Diagnosis not present

## 2024-01-16 DIAGNOSIS — B354 Tinea corporis: Secondary | ICD-10-CM | POA: Diagnosis not present

## 2024-01-16 DIAGNOSIS — L282 Other prurigo: Secondary | ICD-10-CM

## 2024-01-16 MED ORDER — FLUCONAZOLE 150 MG PO TABS: 150.0000 mg | ORAL_TABLET | ORAL | 0 refills | Status: DC

## 2024-01-16 MED ORDER — KETOCONAZOLE 2 % EX CREA: 1.0000 | TOPICAL_CREAM | Freq: Every day | CUTANEOUS | 2 refills | Status: DC

## 2024-01-16 MED ORDER — HYDROXYZINE PAMOATE 25 MG PO CAPS: 25.0000 mg | ORAL_CAPSULE | Freq: Three times a day (TID) | ORAL | 2 refills | Status: AC | PRN

## 2024-01-16 NOTE — Patient Instructions (Signed)
 Body Ringworm  Body ringworm is an infection of the skin that often causes a ring-shaped rash. Body ringworm is also called tinea corporis.  Body ringworm can affect any part of your skin. This condition is easily spread from person to person (is very contagious).  What are the causes?  This condition is caused by fungi called dermatophytes. The condition develops when these fungi grow out of control on the skin.  You can get this condition if you touch a person or animal that has it. You can also get it if you share any items with an infected person or pet. These include:  Clothing, bedding, and towels.  Brushes or combs.  Gym equipment.  Any other object that has the fungus on it.  What increases the risk?  You are more likely to develop this condition if you:  Play sports that involve close physical contact, such as wrestling.  Sweat a lot.  Live in areas that are hot and humid.  Use public showers.  Have a weakened disease-fighting system (immune system).  What are the signs or symptoms?    Symptoms of this condition include:  Itchy, raised red spots and bumps.  Red scaly patches.  A ring-shaped rash. The rash may have:  A clear center.  Scales or red bumps at its center.  Redness near its borders.  Dry and scaly skin on or around it.  How is this diagnosed?  This condition can usually be diagnosed with a skin exam. A skin scraping may be taken from the affected area and examined under a microscope to see if the fungus is present.  How is this treated?  This condition may be treated with:  An antifungal cream or ointment.  An antifungal shampoo.  Antifungal medicines. These may be prescribed if your ringworm:  Is severe.  Keeps coming back or lasts a long time.  Follow these instructions at home:  Take over-the-counter and prescription medicines only as told by your health care provider.  If you were given an antifungal cream or ointment:  Use it as told by your health care provider.  Wash the infected area and  dry it completely before applying the cream or ointment.  If you were given an antifungal shampoo:  Use it as told by your health care provider.  Leave the shampoo on your body for 3-5 minutes before rinsing.  While you have a rash:  Wear loose clothing to stop clothes from rubbing and irritating it.  Wash or change your bed sheets every night.  Wash clothes and bed sheets in hot water.  Disinfect or throw out items that may be infected.  Wash your hands often with soap and water for at least 20 seconds. If soap and water are not available, use hand sanitizer.  If your pet has the same infection, take your pet to see a veterinarian for treatment.  How is this prevented?  Take a bath or shower every day and after every time you work out or play sports.  Dry your skin completely after bathing.  Wear sandals or shoes in public places and showers.  Wash athletic clothes after each use.  Do not share personal items with others.  Avoid touching red patches of skin on other people.  Avoid touching pets that have bald spots.  If you touch an animal that has a bald spot, wash your hands.  Contact a health care provider if:  Your rash continues to spread after 7 days of  treatment.  Your rash is not gone in 4 weeks.  The area around your rash gets red, warm, tender, and swollen.  This information is not intended to replace advice given to you by your health care provider. Make sure you discuss any questions you have with your health care provider.  Document Revised: 08/19/2021 Document Reviewed: 08/19/2021  Elsevier Patient Education  2024 [employer].

## 2024-01-16 NOTE — Progress Notes (Signed)
 Subjective:    Patient ID: [name], male    DOB: 09-04-1935, 88 y.o.   MRN: [id number]  Chief Complaint  Patient presents with   Sarcoptes Scabiei   PT presents to the office today with a rash hands, arms, neck, thighs, back, and lowers legs that has been on going on and off since August. He was told he had scabies and was given permethrin  cream that slightly helped, but never resolved. He was also given prednisone  that did not help.   He is taking benadryl 50 mg every 6 hours that helps with the itching.   He has seen multiple providers and ED. He has taken Keflex  for cellulitis.  Continues to itch.   Has grandchildren staying with him and he is the only one in the family who has the rash.  Rash This is a new problem.      Review of Systems  Skin:  Positive for rash.  All other systems reviewed and are negative.   Social History   Socioeconomic History   Marital status: Widowed    Spouse name: Not on file   Number of children: 2   Years of education: some college   Highest education level: Associate degree: academic program  Occupational History   Occupation: retired  Tobacco Use   Smoking status: Former    Current packs/day: 0.00    Average packs/day: 1.5 packs/day for 40.0 years (60.0 ttl pk-yrs)    Types: Cigarettes    Start date: 01/19/1957    Quit date: 01/19/1997    Years since quitting: 27.0   Smokeless tobacco: Never  Vaping Use   Vaping status: Never Used  Substance and Sexual Activity   Alcohol use: No   Drug use: No   Sexual activity: Not on file  Other Topics Concern   Not on file  Social History Narrative   Widowed. Lives alone.    Social Drivers of Corporate Investment Banker Strain: Low Risk  (09/19/2023)   Overall Financial Resource Strain (CARDIA)    Difficulty of Paying Living Expenses: Not hard at all  Food Insecurity: No Food Insecurity (09/19/2023)   Hunger Vital Sign    Worried About Running Out of Food in the Last Year: Never  true    Ran Out of Food in the Last Year: Never true  Transportation Needs: No Transportation Needs (09/19/2023)   PRAPARE - Administrator, Civil Service (Medical): No    Lack of Transportation (Non-Medical): No  Physical Activity: Inactive (09/19/2023)   Exercise Vital Sign    Days of Exercise per Week: 0 days    Minutes of Exercise per Session: Not on file  Stress: No Stress Concern Present (09/19/2023)   [employer] of Occupational Health - Occupational Stress Questionnaire    Feeling of Stress: Not at all  Social Connections: Moderately Integrated (09/19/2023)   Social Connection and Isolation Panel    Frequency of Communication with Friends and Family: More than three times a week    Frequency of Social Gatherings with Friends and Family: More than three times a week    Attends Religious Services: More than 4 times per year    Active Member of [employer] or Organizations: Yes    Attends Banker Meetings: 1 to 4 times per year    Marital Status: Widowed   Family History  Problem Relation Age of Onset   Colon cancer Mother    Cancer Mother  colon   Heart disease Father        Heart Disease before age 66   Heart attack Father 80   Arthritis Sister    Esophageal cancer Neg Hx    Rectal cancer Neg Hx    Stomach cancer Neg Hx    Colon polyps Neg Hx         Objective:   Physical Exam Vitals reviewed.  Constitutional:      General: He is not in acute distress.    Appearance: He is well-developed.  HENT:     Head: Normocephalic.     Right Ear: Tympanic membrane normal.     Left Ear: Tympanic membrane normal.     Nose: No rhinorrhea.  Eyes:     General:        Right eye: No discharge.        Left eye: No discharge.     Pupils: Pupils are equal, round, and reactive to light.  Neck:     Thyroid : No thyromegaly.  Cardiovascular:     Rate and Rhythm: Normal rate and regular rhythm.     Heart sounds: Normal heart sounds. No murmur  heard. Pulmonary:     Effort: Pulmonary effort is normal. No respiratory distress.     Breath sounds: Normal breath sounds. No wheezing.  Abdominal:     General: Bowel sounds are normal. There is no distension.     Palpations: Abdomen is soft.     Tenderness: There is no abdominal tenderness.  Musculoskeletal:        General: No tenderness. Normal range of motion.     Cervical back: Normal range of motion and neck supple.  Skin:    General: Skin is warm and dry.     Findings: Erythema and rash present.     Comments: Dry skin, fungal rash on bilateral feet. And circular rash on thighs.   Neurological:     Mental Status: He is alert and oriented to person, place, and time.     Cranial Nerves: No cranial nerve deficit.     Deep Tendon Reflexes: Reflexes are normal and symmetric.  Psychiatric:        Behavior: Behavior normal.        Thought Content: Thought content normal.        Judgment: Judgment normal.               BP (!) 150/72   Pulse (!) 102   Temp (!) 97.3 F (36.3 C) (Temporal)   Ht 5' 10 (1.778 m)   Wt 176 lb 6.4 oz (80 kg)   SpO2 96%   BMI 25.31 kg/m      Assessment & Plan:  [name] comes in today with chief complaint of Sarcoptes Scabiei   Diagnosis and orders addressed:  1. Tinea corporis (Primary) - CMP14+EGFR - CBC with Differential/Platelet - TSH - Ambulatory referral to Dermatology - fluconazole (DIFLUCAN) 150 MG tablet; Take 1 tablet (150 mg total) by mouth once a week.  Dispense: 5 tablet; Refill: 0 - ketoconazole (NIZORAL) 2 % cream; Apply 1 Application topically daily.  Dispense: 60 g; Refill: 2  2. Pruritic rash - CMP14+EGFR - TSH - Ambulatory referral to Dermatology - hydrOXYzine  (VISTARIL ) 25 MG capsule; Take 1 capsule (25 mg total) by mouth every 8 (eight) hours as needed.  Dispense: 90 capsule; Refill: 2  3. AKI (acute kidney injury) - CMP14+EGFR - TSH  4. Hospital discharge follow-up - CMP14+EGFR  5. Other  fatigue - TSH   Labs pending Given current symptoms, it does not look like scabies. Given circular rash will treat as fungal. Rash on body looks different. Referral to dermatologists pending given ongoing symptoms for months. Will need biopsy.  Labs pending to rule out other causes Encourage moisturizer  Avoid itching  Report any s/s of infection  Hospital notes reviewed  Appox 40 mins spent with patient, chart review, and education      Bari Learn, [place]

## 2024-01-17 LAB — CMP14+EGFR
ALT: 13 IU/L (ref 0–44)
AST: 18 IU/L (ref 0–40)
Albumin: 4.9 g/dL — AB (ref 3.7–4.7)
Alkaline Phosphatase: 63 IU/L (ref 48–129)
BUN/Creatinine Ratio: 12 (ref 10–24)
BUN: 17 mg/dL (ref 8–27)
Bilirubin Total: 0.4 mg/dL (ref 0.0–1.2)
CO2: 21 mmol/L (ref 20–29)
Calcium: 10.6 mg/dL — AB (ref 8.6–10.2)
Chloride: 102 mmol/L (ref 96–106)
Creatinine, Ser: 1.47 mg/dL — AB (ref 0.76–1.27)
Globulin, Total: 2.5 g/dL (ref 1.5–4.5)
Glucose: 92 mg/dL (ref 70–99)
Potassium: 4.7 mmol/L (ref 3.5–5.2)
Sodium: 139 mmol/L (ref 134–144)
Total Protein: 7.4 g/dL (ref 6.0–8.5)
eGFR: 46 mL/min/1.73 — AB (ref >59)

## 2024-01-17 LAB — CBC WITH DIFFERENTIAL/PLATELET
Basophils Absolute: 0.1 x10E3/uL (ref 0.0–0.2)
Basos: 1 %
EOS (ABSOLUTE): 0.7 x10E3/uL — ABNORMAL HIGH (ref 0.0–0.4)
Eos: 9 %
Hematocrit: 45.5 % (ref 37.5–51.0)
Hemoglobin: 14.7 g/dL (ref 13.0–17.7)
Immature Grans (Abs): 0.1 x10E3/uL (ref 0.0–0.1)
Immature Granulocytes: 1 %
Lymphocytes Absolute: 1.7 x10E3/uL (ref 0.7–3.1)
Lymphs: 20 %
MCH: 31.1 pg (ref 26.6–33.0)
MCHC: 32.3 g/dL (ref 31.5–35.7)
MCV: 96 fL (ref 79–97)
Monocytes Absolute: 0.6 x10E3/uL (ref 0.1–0.9)
Monocytes: 7 %
Neutrophils Absolute: 5.4 x10E3/uL (ref 1.4–7.0)
Neutrophils: 62 %
Platelets: 246 x10E3/uL (ref 150–450)
RBC: 4.72 x10E6/uL (ref 4.14–5.80)
RDW: 12.1 % (ref 11.6–15.4)
WBC: 8.5 x10E3/uL (ref 3.4–10.8)

## 2024-01-17 LAB — TSH: TSH: 9.37 u[IU]/mL — AB (ref 0.450–4.500)

## 2024-01-18 ENCOUNTER — Ambulatory Visit: Payer: Self-pay | Admitting: Family

## 2024-01-18 DIAGNOSIS — E039 Hypothyroidism, unspecified: Secondary | ICD-10-CM

## 2024-01-18 DIAGNOSIS — L308 Other specified dermatitis: Secondary | ICD-10-CM | POA: Diagnosis not present

## 2024-01-18 MED ORDER — LEVOTHYROXINE SODIUM 25 MCG PO TABS: 25.0000 ug | ORAL_TABLET | Freq: Every day | ORAL | 3 refills | Status: DC

## 2024-02-01 ENCOUNTER — Ambulatory Visit: Payer: Self-pay | Admitting: *Deleted

## 2024-02-01 NOTE — Telephone Encounter (Signed)
 FYI Only or Action Required?: FYI only for provider: appointment scheduled on 02/02/24.  Patient was last seen in primary care on 01/16/2024 by Lavell Bari LABOR, FNP.  Called Nurse Triage reporting Leg Pain.  Symptoms began on and off approx 1 year .  Interventions attempted: OTC medications: stool softeners and drinking 72-100 oz of water daily and 1 pepsi and Rest, hydration, or home remedies.  Symptoms are: gradually worsening.  Triage Disposition: See PCP Within 2 Weeks  Patient/caregiver understands and will follow disposition?: Yes     Copied from CRM [id number]. Topic: Clinical - Red Word Triage >> Feb 01, 2024  9:48 AM Ivette P wrote: Red Word that prompted transfer to Nurse Triage: pt getting severe leg cramps, unable to sleep. Getting worse. Reason for Disposition  Leg pain or muscle cramp is a chronic symptom (recurrent or ongoing AND present > 4 weeks)  Answer Assessment - Initial Assessment Questions No available appt with PCP until Jan 8. Patient with mutliple sx. Constipation and requesting prescription for stool softener. Appt scheduled for tomorrow with DOD due to medication request and worsening leg cramps.  Patient denies chest pain no difficulty breathing no severe leg swelling, mild only in left leg . Can walk.       1. ONSET: When did the pain start?      On and off approx 1 yearly  2. LOCATION: Where is the pain located?      Bilateral legs, left leg worse than right leg 3. PAIN: How bad is the pain?    (Scale 1-10; or mild, moderate, severe)     severe 4. WORK OR EXERCISE: Has there been any recent work or exercise that involved this part of the body?      na 5. CAUSE: What do you think is causing the leg pain?     Not sure  6. OTHER SYMPTOMS: Do you have any other symptoms? (e.g., chest pain, back pain, breathing difficulty, swelling, rash, fever, numbness, weakness)     Bilateral leg cramps mostly at night, requesting stool softeners due  to constipation and having BM daily is an ordeal. Patient taking iron. OTC muscle support and sleep aid and not working  7. PREGNANCY: Is there any chance you are pregnant? When was your last menstrual period?     na  Protocols used: Leg Pain-A-AH

## 2024-02-02 ENCOUNTER — Ambulatory Visit: Admitting: Family

## 2024-02-02 ENCOUNTER — Encounter: Payer: Self-pay | Admitting: Family

## 2024-02-02 ENCOUNTER — Ambulatory Visit

## 2024-02-02 VITALS — BP 125/78 | HR 56 | Temp 97.6°F | Ht 70.0 in | Wt 176.6 lb

## 2024-02-02 DIAGNOSIS — K5900 Constipation, unspecified: Secondary | ICD-10-CM | POA: Diagnosis not present

## 2024-02-02 DIAGNOSIS — R252 Cramp and spasm: Secondary | ICD-10-CM | POA: Diagnosis not present

## 2024-02-02 MED ORDER — LUBIPROSTONE 8 MCG PO CAPS: 8.0000 ug | ORAL_CAPSULE | Freq: Two times a day (BID) | ORAL | 2 refills | Status: AC

## 2024-02-02 MED ORDER — GABAPENTIN 100 MG PO CAPS: 100.0000 mg | ORAL_CAPSULE | Freq: Every day | ORAL | 3 refills | Status: AC

## 2024-02-02 NOTE — Progress Notes (Signed)
 Subjective:    Patient ID: [name], male    DOB: 05-02-1935, 88 y.o.   MRN: [id number]  Chief Complaint  Patient presents with   leg cramps    wAking him up    Constipation    2 weeks has tried otc and not helping    PT presents to the office today constipation and leg cramps.   Complaining of leg cramps at night that are waking him up. Can be a aching pain 5 out 10.  Constipation This is a new problem. The current episode started 1 to 4 weeks ago. The problem is unchanged. His stool frequency is 1 time per day. The stool is described as firm. He has tried laxatives and stool softeners for the symptoms. The treatment provided no relief.      Review of Systems  Gastrointestinal:  Positive for constipation.  All other systems reviewed and are negative.   Social History   Socioeconomic History   Marital status: Widowed    Spouse name: Not on file   Number of children: 2   Years of education: some college   Highest education level: Associate degree: academic program  Occupational History   Occupation: retired  Tobacco Use   Smoking status: Former    Current packs/day: 0.00    Average packs/day: 1.5 packs/day for 40.0 years (60.0 ttl pk-yrs)    Types: Cigarettes    Start date: 01/19/1957    Quit date: 01/19/1997    Years since quitting: 27.0   Smokeless tobacco: Never  Vaping Use   Vaping status: Never Used  Substance and Sexual Activity   Alcohol use: No   Drug use: No   Sexual activity: Not on file  Other Topics Concern   Not on file  Social History Narrative   Widowed. Lives alone.    Social Drivers of Corporate Investment Banker Strain: Low Risk  (09/19/2023)   Overall Financial Resource Strain (CARDIA)    Difficulty of Paying Living Expenses: Not hard at all  Food Insecurity: No Food Insecurity (09/19/2023)   Hunger Vital Sign    Worried About Running Out of Food in the Last Year: Never true    Ran Out of Food in the Last Year: Never true   Transportation Needs: No Transportation Needs (09/19/2023)   PRAPARE - Administrator, Civil Service (Medical): No    Lack of Transportation (Non-Medical): No  Physical Activity: Inactive (09/19/2023)   Exercise Vital Sign    Days of Exercise per Week: 0 days    Minutes of Exercise per Session: Not on file  Stress: No Stress Concern Present (09/19/2023)   [employer] of Occupational Health - Occupational Stress Questionnaire    Feeling of Stress: Not at all  Social Connections: Moderately Integrated (09/19/2023)   Social Connection and Isolation Panel    Frequency of Communication with Friends and Family: More than three times a week    Frequency of Social Gatherings with Friends and Family: More than three times a week    Attends Religious Services: More than 4 times per year    Active Member of [employer] or Organizations: Yes    Attends Banker Meetings: 1 to 4 times per year    Marital Status: Widowed   Family History  Problem Relation Age of Onset   Colon cancer Mother    Cancer Mother        colon   Heart disease Father  Heart Disease before age 35   Heart attack Father 25   Arthritis Sister    Esophageal cancer Neg Hx    Rectal cancer Neg Hx    Stomach cancer Neg Hx    Colon polyps Neg Hx         Objective:   Physical Exam Vitals reviewed.  Constitutional:      General: He is not in acute distress.    Appearance: He is well-developed.  HENT:     Head: Normocephalic.     Right Ear: Tympanic membrane normal.     Left Ear: Tympanic membrane normal.  Eyes:     General:        Right eye: No discharge.        Left eye: No discharge.     Pupils: Pupils are equal, round, and reactive to light.  Neck:     Thyroid : No thyromegaly.  Cardiovascular:     Rate and Rhythm: Normal rate and regular rhythm.     Heart sounds: Normal heart sounds. No murmur heard. Pulmonary:     Effort: Pulmonary effort is normal. No respiratory distress.      Breath sounds: Normal breath sounds. No wheezing.  Abdominal:     General: Bowel sounds are normal. There is no distension.     Palpations: Abdomen is soft.     Tenderness: There is no abdominal tenderness.  Musculoskeletal:        General: No tenderness. Normal range of motion.     Cervical back: Normal range of motion and neck supple.  Skin:    General: Skin is warm and dry.     Findings: No erythema or rash.  Neurological:     Mental Status: He is alert and oriented to person, place, and time.     Cranial Nerves: No cranial nerve deficit.     Deep Tendon Reflexes: Reflexes are normal and symmetric.  Psychiatric:        Behavior: Behavior normal.        Thought Content: Thought content normal.        Judgment: Judgment normal.       BP 125/78   Pulse (!) 56   Temp 97.6 F (36.4 C) (Temporal)   Ht 5' 10 (1.778 m)   Wt 176 lb 9.6 oz (80.1 kg)   SpO2 95%   BMI 25.34 kg/m      Assessment & Plan:  Jabron Weese comes in today with chief complaint of leg cramps (wAking him up ) and Constipation (2 weeks has tried otc and not helping )   Diagnosis and orders addressed:  1. Constipation, unspecified constipation type (Primary) Has tried multiple OTC without success Will start Amitiza BID  Force fluids High fiber diet  - lubiprostone (AMITIZA) 8 MCG capsule; Take 1 capsule (8 mcg total) by mouth 2 (two) times daily with a meal.  Dispense: 60 capsule; Refill: 2  2. Leg cramp -Start Vit B complex, Vitamin E, and Vit K Labs reviewed from 01/16/24 Encouraged ROM and stretching prior to bedtime If cramping continues, can use gabapentin 100 mg at bedtime. Discussed this could make him sleepy. - gabapentin (NEURONTIN) 100 MG capsule; Take 1 capsule (100 mg total) by mouth at bedtime.  Dispense: 30 capsule; Refill: 3     Bari Learn, FNP

## 2024-02-02 NOTE — Patient Instructions (Addendum)
 Leg Cramps: What They Mean  Start vitamin B complex at 30 mg three times daily and vitamin E at 800 IU before bed Leg cramps happen when one or more muscles tighten and there's no control over it. They can happen during exercise or when you're resting. Leg cramps are painful and can last for a few seconds to minutes. They can also come back many times before stopping. Usually, leg cramps aren't caused by a serious medical problem. Often, the cause isn't known. Some common causes include: Problems with moving or not moving the body, like: Working your muscles too hard, such as during intense exercise. Doing the same motion over and over. Not warming up or stretching before playing sports or doing activities. Using the wrong technique or form when playing sports or doing activities. Staying in one position for a long time. Water or electrolyte balance issues, like: Not drinking enough fluids or being dehydrated. Getting sick from too much heat. Having low levels of minerals called electrolytes in your blood, like potassium and calcium . This can happen from: Pregnancy. Taking medicines that make you pee more, also called diuretic medicines. Not getting enough nutrients from your diet. Side effects of some medicines. Follow these instructions at home: Eating and drinking Eat and drink as told. Eat a healthy diet that includes plenty of nutrients to help your muscles work well. A healthy diet includes fruits and vegetables, lean protein, whole grains, and low-fat or nonfat dairy products. Drink enough fluids to keep your pee pale yellow. Drinking more water may help prevent cramps. Managing pain and muscle cramping     Massage, stretch, and relax the cramped muscle. Do this for several minutes at a time. Use ice or an ice pack as told. Place a towel between your skin and the ice. Leave the ice on for 20 minutes, 2-3 times a day. Use heat as told. Use the heat source that your provider  recommends, such as a moist heat pack or a heating pad. Do this as often as told. Place a towel between your skin and the heat source. Leave the heat on for 20-30 minutes. If your skin turns red, take off the ice or heat right away to prevent skin damage. The risk of damage is higher if you can't feel pain, heat, or cold. Take hot showers or baths to help relax tight muscles. General instructions If you're having a lot of leg cramps, avoid hard workouts for several days. Take supplements and medicines only as told. Contact a health care provider if: Your leg cramps get worse or happen more often. Your leg cramps don't get better over time. Your foot becomes cold, numb, or blue. This information is not intended to replace advice given to you by your health care provider. Make sure you discuss any questions you have with your health care provider. Document Revised: 02/24/2023 Document Reviewed: 11/16/2022 Elsevier Patient Education  2025 [employer].

## 2024-02-06 ENCOUNTER — Other Ambulatory Visit (HOSPITAL_COMMUNITY): Payer: Self-pay

## 2024-02-06 ENCOUNTER — Telehealth: Payer: Self-pay | Admitting: Pharmacy Technician

## 2024-02-06 NOTE — Telephone Encounter (Signed)
 Pharmacy Patient Advocate Encounter   Received notification from Onbase that prior authorization for Lubiprostone 8MCG capsules is required/requested.   Insurance verification completed.   The patient is insured through [employer].   Per test claim: PA required and submitted KEY/EOC/Request #: [id number] from 01/07/24 to 02/02/25. Ran test claim, Copay is $16.00. This test claim was processed through [facility]- copay amounts may vary at other pharmacies due to pharmacy/plan contracts, or as the patient moves through the different stages of their insurance plan.

## 2024-02-07 DIAGNOSIS — C44222 Squamous cell carcinoma of skin of right ear and external auricular canal: Secondary | ICD-10-CM | POA: Diagnosis not present

## 2024-02-20 ENCOUNTER — Other Ambulatory Visit: Payer: Self-pay | Admitting: Family Medicine

## 2024-03-04 ENCOUNTER — Other Ambulatory Visit: Payer: Self-pay | Admitting: Family Medicine

## 2024-03-21 ENCOUNTER — Other Ambulatory Visit: Payer: Self-pay | Admitting: Family Medicine

## 2024-03-21 DIAGNOSIS — E785 Hyperlipidemia, unspecified: Secondary | ICD-10-CM

## 2024-03-28 ENCOUNTER — Ambulatory Visit: Payer: Self-pay | Admitting: Family Medicine

## 2024-03-28 ENCOUNTER — Encounter: Payer: Self-pay | Admitting: Family Medicine

## 2024-03-28 VITALS — BP 139/87 | HR 85 | Ht 70.0 in | Wt 180.0 lb

## 2024-03-28 DIAGNOSIS — E785 Hyperlipidemia, unspecified: Secondary | ICD-10-CM

## 2024-03-28 DIAGNOSIS — J449 Chronic obstructive pulmonary disease, unspecified: Secondary | ICD-10-CM

## 2024-03-28 DIAGNOSIS — I10 Essential (primary) hypertension: Secondary | ICD-10-CM

## 2024-03-28 DIAGNOSIS — N3281 Overactive bladder: Secondary | ICD-10-CM

## 2024-03-28 DIAGNOSIS — Z0000 Encounter for general adult medical examination without abnormal findings: Secondary | ICD-10-CM

## 2024-03-28 LAB — CBC WITH DIFF/PLATELET
Basophils Absolute: 0.1 x10E3/uL (ref 0.0–0.2)
Basos: 1 %
EOS (ABSOLUTE): 0.8 x10E3/uL — ABNORMAL HIGH (ref 0.0–0.4)
Eos: 9 %
Hematocrit: 44.2 % (ref 37.5–51.0)
Hemoglobin: 13.8 g/dL (ref 13.0–17.7)
Immature Grans (Abs): 0.1 x10E3/uL (ref 0.0–0.1)
Immature Granulocytes: 1 %
Lymphocytes Absolute: 1.8 x10E3/uL (ref 0.7–3.1)
Lymphs: 20 %
MCH: 29.8 pg (ref 26.6–33.0)
MCHC: 31.2 g/dL — ABNORMAL LOW (ref 31.5–35.7)
MCV: 96 fL (ref 79–97)
Monocytes Absolute: 0.7 x10E3/uL (ref 0.1–0.9)
Monocytes: 8 %
Neutrophils Absolute: 5.6 x10E3/uL (ref 1.4–7.0)
Neutrophils: 61 %
Platelets: 224 x10E3/uL (ref 150–450)
RBC: 4.63 x10E6/uL (ref 4.14–5.80)
RDW: 13.2 % (ref 11.6–15.4)
WBC: 9 x10E3/uL (ref 3.4–10.8)

## 2024-03-28 LAB — LIPID PANEL
Chol/HDL Ratio: 1.8 ratio (ref 0.0–5.0)
Cholesterol, Total: 94 mg/dL — ABNORMAL LOW (ref 100–199)
HDL: 51 mg/dL
LDL Chol Calc (NIH): 28 mg/dL (ref 0–99)
Triglycerides: 69 mg/dL (ref 0–149)
VLDL Cholesterol Cal: 15 mg/dL (ref 5–40)

## 2024-03-28 LAB — CMP14+EGFR
ALT: 14 IU/L (ref 0–44)
AST: 16 IU/L (ref 0–40)
Albumin: 4.2 g/dL (ref 3.7–4.7)
Alkaline Phosphatase: 47 IU/L — ABNORMAL LOW (ref 48–129)
BUN/Creatinine Ratio: 14 (ref 10–24)
BUN: 20 mg/dL (ref 8–27)
Bilirubin Total: 0.4 mg/dL (ref 0.0–1.2)
CO2: 22 mmol/L (ref 20–29)
Calcium: 9.9 mg/dL (ref 8.6–10.2)
Chloride: 103 mmol/L (ref 96–106)
Creatinine, Ser: 1.43 mg/dL — ABNORMAL HIGH (ref 0.76–1.27)
Globulin, Total: 2 g/dL (ref 1.5–4.5)
Glucose: 120 mg/dL — ABNORMAL HIGH (ref 70–99)
Potassium: 4.7 mmol/L (ref 3.5–5.2)
Sodium: 139 mmol/L (ref 134–144)
Total Protein: 6.2 g/dL (ref 6.0–8.5)
eGFR: 48 mL/min/1.73 — ABNORMAL LOW

## 2024-03-28 LAB — TSH: TSH: 7.18 u[IU]/mL — ABNORMAL HIGH (ref 0.450–4.500)

## 2024-03-28 LAB — PSA, TOTAL AND FREE
PSA, Free: <0.02 ng/mL
Prostate Specific Ag, Serum: <0.1 ng/mL (ref 0.0–4.0)

## 2024-03-28 MED ORDER — CETIRIZINE HCL 10 MG PO TABS: 10.0000 mg | ORAL_TABLET | Freq: Every day | ORAL | 3 refills | Status: AC

## 2024-03-28 MED ORDER — AMLODIPINE BESYLATE 10 MG PO TABS: 10.0000 mg | ORAL_TABLET | Freq: Every day | ORAL | 0 refills | Status: AC

## 2024-03-28 MED ORDER — VITAMIN D3 125 MCG (5000 UT) PO CAPS: 5000.0000 [IU] | ORAL_CAPSULE | Freq: Every day | ORAL | 3 refills | Status: AC

## 2024-03-28 MED ORDER — MIRABEGRON ER 50 MG PO TB24: 50.0000 mg | ORAL_TABLET | Freq: Every day | ORAL | 3 refills | Status: AC

## 2024-03-28 MED ORDER — STIOLTO RESPIMAT 2.5-2.5 MCG/ACT IN AERS: 2.0000 | INHALATION_SPRAY | Freq: Every day | RESPIRATORY_TRACT | 3 refills | Status: AC

## 2024-03-28 MED ORDER — FENOFIBRATE 48 MG PO TABS: 48.0000 mg | ORAL_TABLET | Freq: Every day | ORAL | 0 refills | Status: AC

## 2024-03-28 NOTE — Progress Notes (Signed)
 "  BP 139/87   Pulse 85   Ht 5' 10 (1.778 m)   Wt 180 lb (81.6 kg)   SpO2 96%   BMI 25.83 kg/m    Subjective:   Patient ID: [name], male    DOB: 1935/10/05, 89 y.o.   MRN: [id number]  HPI: [name] is a 89 y.o. male presenting on 03/28/2024 for Medical Management of Chronic Issues, Hyperlipidemia, Hypertension, and Hypothyroidism   Discussed the use of AI scribe software for clinical note transcription with the patient, who gave verbal consent to proceed.  History of Present Illness   [name] is an 89 year old male with severe eczema who presents for a physical exam and follow-up.  Eczematous dermatitis - Severe eczema present for six months, affecting the entire body - Flares occur in different locations, associated with significant pruritus - Various topical lotions, including clotrimazole, provide slow improvement - Temporary relief achieved with steroid therapy, but unable to use long-term  History of cutaneous malignancy - Recent excisions of skin cancer from earlobe and scalp - Scalp lesion required a second procedure for complete removal, involving scraping of the scalp  Thyroid  dysfunction - Low thyroid  function identified on previous testing, with TSH level of 9 - Currently taking low dose thyroid  replacement (25 mcg)  Hypertension - Currently taking antihypertensive medication  Dietary and lifestyle modifications - Reduced non-caffeinated soda intake from 6-8 daily to 3 per week - Increased water intake to 64-100 ounces daily          Relevant past medical, surgical, family and social history reviewed and updated as indicated. Interim medical history since our last visit reviewed. Allergies and medications reviewed and updated.  Review of Systems  Constitutional:  Negative for chills and fever.  HENT:  Negative for ear pain and tinnitus.   Eyes:  Negative for pain and visual disturbance.  Respiratory:  Negative for cough, shortness  of breath and wheezing.   Cardiovascular:  Positive for leg swelling. Negative for chest pain and palpitations.  Gastrointestinal:  Negative for abdominal pain, blood in stool, constipation and diarrhea.  Genitourinary:  Negative for dysuria and hematuria.  Musculoskeletal:  Negative for back pain, gait problem and myalgias.  Skin:  Positive for rash and wound.  Neurological:  Negative for dizziness, weakness, light-headedness and headaches.  Psychiatric/Behavioral:  Negative for suicidal ideas.   All other systems reviewed and are negative.   Per HPI unless specifically indicated above   Allergies as of 03/28/2024       Reactions   Lamisil [terbinafine Hcl] Other (See Comments)   Loss of taste         Medication List        Accurate as of March 28, 2024  8:33 AM. If you have any questions, ask your nurse or doctor.          albuterol  108 (90 Base) MCG/ACT inhaler Commonly known as: ProAir  HFA 2 puffs every 4 hours as needed only  if your can't catch your breath   amLODipine  10 MG tablet Commonly known as: NORVASC  Take 1 tablet (10 mg total) by mouth daily.   aspirin 81 MG tablet Take 162 mg by mouth daily.   b complex vitamins capsule Take 1 capsule by mouth daily.   cetirizine  10 MG tablet Commonly known as: ZYRTEC  Take 1 tablet (10 mg total) by mouth daily.   clobetasol cream 0.05 % Commonly known as: TEMOVATE Apply 1 Application topically 2 (two) times daily.  fenofibrate  48 MG tablet Commonly known as: TRICOR  Take 1 tablet (48 mg total) by mouth daily.   fexofenadine 180 MG tablet Commonly known as: ALLEGRA Take 180 mg by mouth daily.   gabapentin  100 MG capsule Commonly known as: NEURONTIN  Take 1 capsule (100 mg total) by mouth at bedtime.   hydrOXYzine  10 MG tablet Commonly known as: ATARAX  Take 1 tablet (10 mg total) by mouth every 6 (six) hours as needed for itching.   hydrOXYzine  25 MG capsule Commonly known as: VISTARIL  Take 1  capsule (25 mg total) by mouth every 8 (eight) hours as needed.   IRON COMPLEX PO Take 60 mg by mouth daily.   levothyroxine  25 MCG tablet Commonly known as: SYNTHROID  Take 1 tablet (25 mcg total) by mouth daily.   lubiprostone  8 MCG capsule Commonly known as: Amitiza  Take 1 capsule (8 mcg total) by mouth 2 (two) times daily with a meal.   mirabegron  ER 50 MG Tb24 tablet Commonly known as: Myrbetriq  Take 1 tablet (50 mg total) by mouth daily.   pantoprazole  40 MG tablet Commonly known as: PROTONIX  Take 1 tablet (40 mg total) by mouth daily.   QUEtiapine  100 MG tablet Commonly known as: SEROQUEL  Take 1 tablet (100 mg total) by mouth at bedtime.   Repatha  SureClick 140 MG/ML Soaj Generic drug: Evolocumab  INJECT 140 MG UNDER THE SKIN EVERY 14 DAYS (NEED APPOINTMENT FOR FURTHER FILLS)   Stiolto Respimat  2.5-2.5 MCG/ACT Aers Generic drug: Tiotropium Bromide-Olodaterol Inhale 2 Inhalations into the lungs daily.   triamcinolone  cream 0.1 % Commonly known as: KENALOG  Apply 1 Application topically 2 (two) times daily as needed.   Vitamin D3 125 MCG (5000 UT) Caps Take 1 capsule (5,000 Units total) by mouth daily.   vitamin k 100 MCG tablet Take 100 mcg by mouth daily.         Objective:   BP 139/87   Pulse 85   Ht 5' 10 (1.778 m)   Wt 180 lb (81.6 kg)   SpO2 96%   BMI 25.83 kg/m   Wt Readings from Last 3 Encounters:  03/28/24 180 lb (81.6 kg)  02/02/24 176 lb 9.6 oz (80.1 kg)  01/16/24 176 lb 6.4 oz (80 kg)    Physical Exam Physical Exam   VITALS: BP- 139/87 NECK: Thyroid  normal, no lumps or nodules. CHEST: Lungs clear to auscultation bilaterally. CARDIOVASCULAR: Heart regular rate and rhythm, no murmurs. EXTREMITIES: Mild bilateral leg edema.         Assessment & Plan:   Problem List Items Addressed This Visit       Cardiovascular and Mediastinum   HTN (hypertension), benign   Relevant Medications   amLODipine  (NORVASC ) 10 MG tablet    fenofibrate  (TRICOR ) 48 MG tablet   Other Relevant Orders   TSH   CBC With Diff/Platelet   CMP14+EGFR   Lipid panel   PSA, total and free     Respiratory   COPD GOLD II   Relevant Medications   cetirizine  (ZYRTEC ) 10 MG tablet   Tiotropium Bromide-Olodaterol (STIOLTO RESPIMAT ) 2.5-2.5 MCG/ACT AERS   fexofenadine (ALLEGRA) 180 MG tablet   Other Relevant Orders   TSH   CBC With Diff/Platelet   CMP14+EGFR   Lipid panel   PSA, total and free     Genitourinary   Overactive bladder   Relevant Medications   mirabegron  ER (MYRBETRIQ ) 50 MG TB24 tablet     Other   Hyperlipidemia LDL goal <100   Relevant Medications  amLODipine  (NORVASC ) 10 MG tablet   fenofibrate  (TRICOR ) 48 MG tablet   Other Relevant Orders   TSH   CBC With Diff/Platelet   CMP14+EGFR   Lipid panel   PSA, total and free   Other Visit Diagnoses       Physical exam    -  Primary   Relevant Orders   TSH   CBC With Diff/Platelet   CMP14+EGFR   Lipid panel   PSA, total and free          Severe eczema Chronic severe eczema with intermittent flares. Improvement noted with topical treatments. Discussed potential use of immunosuppressants, noting infection risk. - Continue topical treatments including clonazole. - Consider immunosuppressants if condition worsens.  History of skin cancer Recent excisions on earlobe and scalp.  Essential hypertension Blood pressure well-controlled on current medication. - Continue current antihypertensive medication.  Hypothyroidism Mild hypothyroidism on low-dose levothyroxine . Thyroid  function re-evaluation today. - Rechecked thyroid  function tests today. - Continue current dose of levothyroxine .  Lower extremity edema Mild bilateral edema managed with Lasix . Reports discomfort with Vantin use. - Continue Lasix  as needed.  General Health Maintenance Increased water intake, reduced soda consumption. No hydration or kidney issues. - Continue current hydration  regimen.          Follow up plan: Return in about 6 months (around 09/25/2024), or if symptoms worsen or fail to improve, for Recheck hypertension and cholesterol.  Counseling provided for all of the vaccine components Orders Placed This Encounter  Procedures   TSH   CBC With Diff/Platelet   CMP14+EGFR   Lipid panel   PSA, total and free    Fonda Levins, MD [facility] 03/28/2024, 8:33 AM     "

## 2024-04-05 ENCOUNTER — Ambulatory Visit: Payer: Self-pay | Admitting: Family Medicine

## 2024-04-05 MED ORDER — LEVOTHYROXINE SODIUM 50 MCG PO TABS: 50.0000 ug | ORAL_TABLET | Freq: Every day | ORAL | 1 refills | Status: AC

## 2024-04-26 ENCOUNTER — Telehealth: Payer: Self-pay | Admitting: Family Medicine

## 2024-04-26 NOTE — Telephone Encounter (Signed)
 Copied from CRM [id number]. Topic: Clinical - Medication Question >> Apr 26, 2024 10:13 AM Carrielelia G wrote: Reason for CRM: Patient [name] would like to know can he drop the used REPATHA  SURECLICK 140 MG/ML devices off and put in your bio hazard box. He was instructed that he can not throw them in the garbage and he has a lot to discard. Or please instruct where he needs to take them.   Please advise.

## 2024-04-26 NOTE — Telephone Encounter (Signed)
 Pt made aware to call his pharmacy to drop them off there. Advised pt that when he comes into the office we could discard a little at a time.

## 2024-09-27 ENCOUNTER — Ambulatory Visit: Admitting: Family Medicine
